# Patient Record
Sex: Male | Born: 1980 | Race: Black or African American | Hispanic: No | Marital: Married | Smoking: Current every day smoker
Health system: Southern US, Community
[De-identification: ages and names within clinical notes are randomized; demographics above are authoritative.]

## PROBLEM LIST (undated history)

## (undated) DIAGNOSIS — G4733 Obstructive sleep apnea (adult) (pediatric): Secondary | ICD-10-CM

## (undated) DIAGNOSIS — D869 Sarcoidosis, unspecified: Secondary | ICD-10-CM

## (undated) DIAGNOSIS — F329 Major depressive disorder, single episode, unspecified: Secondary | ICD-10-CM

## (undated) DIAGNOSIS — F32A Depression, unspecified: Secondary | ICD-10-CM

## (undated) DIAGNOSIS — I10 Essential (primary) hypertension: Secondary | ICD-10-CM

## (undated) DIAGNOSIS — G4730 Sleep apnea, unspecified: Secondary | ICD-10-CM

## (undated) DIAGNOSIS — F419 Anxiety disorder, unspecified: Secondary | ICD-10-CM

## (undated) DIAGNOSIS — Z9989 Dependence on other enabling machines and devices: Secondary | ICD-10-CM

## (undated) DIAGNOSIS — M1990 Unspecified osteoarthritis, unspecified site: Secondary | ICD-10-CM

## (undated) DIAGNOSIS — I319 Disease of pericardium, unspecified: Secondary | ICD-10-CM

## (undated) DIAGNOSIS — K219 Gastro-esophageal reflux disease without esophagitis: Secondary | ICD-10-CM

## (undated) DIAGNOSIS — J189 Pneumonia, unspecified organism: Secondary | ICD-10-CM

## (undated) DIAGNOSIS — I493 Ventricular premature depolarization: Secondary | ICD-10-CM

## (undated) DIAGNOSIS — R7303 Prediabetes: Secondary | ICD-10-CM

## (undated) HISTORY — DX: Ventricular premature depolarization: I49.3

## (undated) HISTORY — DX: Sleep apnea, unspecified: G47.30

## (undated) HISTORY — DX: Disease of pericardium, unspecified: I31.9

## (undated) HISTORY — DX: Prediabetes: R73.03

## (undated) HISTORY — DX: Anxiety disorder, unspecified: F41.9

## (undated) HISTORY — DX: Obstructive sleep apnea (adult) (pediatric): G47.33

## (undated) HISTORY — DX: Dependence on other enabling machines and devices: Z99.89

## (undated) HISTORY — DX: Sarcoidosis, unspecified: D86.9

## (undated) HISTORY — PX: NO PAST SURGERIES: SHX2092

---
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
## 2017-04-18 ENCOUNTER — Ambulatory Visit
Admission: EM | Admit: 2017-04-18 | Discharge: 2017-04-18 | Disposition: A | Discharge status: Home / Self Care | Payer: Worker's Compensation | Attending: Family Medicine | Admitting: Family Medicine

## 2017-04-18 ENCOUNTER — Encounter: Payer: Self-pay | Admitting: Emergency Medicine

## 2017-04-18 ENCOUNTER — Other Ambulatory Visit: Payer: Self-pay

## 2017-04-18 DIAGNOSIS — T33532A Superficial frostbite of left finger(s), initial encounter: Secondary | ICD-10-CM | POA: Diagnosis not present

## 2017-04-18 DIAGNOSIS — T33531A Superficial frostbite of right finger(s), initial encounter: Secondary | ICD-10-CM

## 2017-04-18 DIAGNOSIS — T3390XA Superficial frostbite of unspecified sites, initial encounter: Secondary | ICD-10-CM

## 2017-04-18 DIAGNOSIS — W932XXA Prolonged exposure in deep freeze unit or refrigerator, initial encounter: Secondary | ICD-10-CM

## 2017-04-18 HISTORY — DX: Essential (primary) hypertension: I10

## 2017-04-18 NOTE — ED Triage Notes (Signed)
Patient in today c/o skin discoloration and tingling x 1-2 weeks of his finger tips and bilateral great toes. 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

## 2017-04-18 NOTE — ED Provider Notes (Signed)
[facility]    CSN: [id number] Arrival date & time: 04/18/17  0846     History   Chief Complaint Chief Complaint  Patient presents with  . Skin Discoloration    HPI Luis Mcknight is a 37 y.o. male.   37 yo male with a c/o skin discoloration and tingling to his fingertips for the last 7 days. 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 However he noticed his symptoms 3 days after his last work shift about 10 days ago. Denies any blisters, bleeding, drainage, fevers, chills.    The history is provided by the patient.    Past Medical History:  Diagnosis Date  . Hypertension     There are no active problems to display for this patient.   History reviewed. No pertinent surgical history.     Home Medications    Prior to Admission medications   Medication Sig Start Date End Date Taking? Authorizing Provider  amLODipine (NORVASC) 10 MG tablet Take 10 mg by mouth daily.   Yes [provider]    Family History Family History  Problem Relation Age of Onset  . Hypertension Mother   . Diabetes Mother   . Diabetes Father   . Hypertension Father     Social History Social History   Tobacco Use  . Smoking status: Current Every Day Smoker    Packs/day: 1.00    Years: 15.00    Pack years: 15.00    Types: Cigarettes  . Smokeless tobacco: Never Used  Substance Use Topics  . Alcohol use: Yes    Comment: socially  . Drug use: No     Allergies   Patient has no known allergies.   Review of Systems Review of Systems   Physical Exam Triage Vital Signs ED Triage Vitals [04/18/17 0917]  Enc Vitals Group     BP 136/78     Pulse Rate 70     Resp 16     Temp 98.4 F (36.9 C)     Temp Source Oral     SpO2 100 %     Weight 230 lb (104.3 kg)     Height 6' (1.829 m)     Head Circumference      Peak Flow      Pain Score 4     Pain Loc      Pain Edu?    Excl. in [place]?    No data found.  Updated Vital Signs BP 136/78 (BP Location: Left Arm)   Pulse 70   Temp 98.4 F (36.9 C) (Oral)   Resp 16   Ht 6' (1.829 m)   Wt 230 lb (104.3 kg)   SpO2 100%   BMI 31.19 kg/m   Visual Acuity Right Eye Distance:   Left Eye Distance:   Bilateral Distance:    Right Eye Near:   Left Eye Near:    Bilateral Near:     Physical Exam  Constitutional: He appears well-developed and well-nourished. No distress.  Musculoskeletal:       Right hand: He exhibits normal range of motion, no tenderness, no bony tenderness, normal two-point discrimination, normal capillary refill, no deformity, no laceration and no swelling. Normal sensation noted. Normal strength noted.       Left hand: He exhibits normal range of motion, no tenderness, no bony tenderness, normal two-point discrimination, normal capillary refill, no deformity and no laceration.  Normal sensation noted. Normal strength noted.  Skin: Skin is warm. He is not diaphoretic.  Mild skin discoloration to fingertips; positive normal capillary refill; no skin lesions or blisters  Nursing note and vitals reviewed.    UC Treatments / Results  Labs (all labs ordered are listed, but only abnormal results are displayed) Labs Reviewed - No data to display  EKG  EKG Interpretation None       Radiology No results found.  Procedures Procedures (including critical care time)  Medications Ordered in UC Medications - No data to display   Initial Impression / Assessment and Plan / UC Course  I have reviewed the triage vital signs and the nursing notes.  Pertinent labs & imaging results that were available during my care of the patient were reviewed by me and considered in my medical decision making (see chart for details).      Final Clinical Impressions(s) / UC Diagnoses   Final diagnoses:  Frostbite, initial encounter  (mild; improving)  ED Discharge Orders    None     1. diagnosis  reviewed with patient 2. Recommend supportive treatment with warming measures for hand/fingers; monitor 3. Follow-up prn if symptoms worsen or don't improve  Controlled Substance Prescriptions [place] Controlled Substance Registry consulted? Not Applicable   Norval Gable, MD 04/18/17 2047

## 2017-08-30 ENCOUNTER — Encounter: Payer: Self-pay | Admitting: Emergency Medicine

## 2017-08-30 ENCOUNTER — Other Ambulatory Visit: Payer: Self-pay

## 2017-08-30 ENCOUNTER — Ambulatory Visit
Admission: EM | Admit: 2017-08-30 | Discharge: 2017-08-30 | Disposition: A | Discharge status: Home / Self Care | Payer: Medicaid Other | Attending: Emergency Medicine | Admitting: Emergency Medicine

## 2017-08-30 DIAGNOSIS — G44211 Episodic tension-type headache, intractable: Secondary | ICD-10-CM

## 2017-08-30 DIAGNOSIS — G44201 Tension-type headache, unspecified, intractable: Secondary | ICD-10-CM | POA: Diagnosis not present

## 2017-08-30 DIAGNOSIS — I10 Essential (primary) hypertension: Secondary | ICD-10-CM | POA: Diagnosis not present

## 2017-08-30 DIAGNOSIS — Z760 Encounter for issue of repeat prescription: Secondary | ICD-10-CM

## 2017-08-30 MED ORDER — LISINOPRIL 10 MG PO TABS: 10.0000 mg | ORAL_TABLET | Freq: Every day | ORAL | 0 refills | Status: DC

## 2017-08-30 NOTE — ED Provider Notes (Signed)
[facility]    CSN: [id number] Arrival date & time: 08/30/17  1257     History   Chief Complaint Chief Complaint  Patient presents with  . Hypertension    HPI Luis Mcknight is a 37 y.o. male.   37 year old male presents with concern over elevated blood pressure. He has been on Amlodipine (Norvasc) 10mg  in the past but blood pressure still has remained elevated. Readings have been 150/90-100. He has even increased it to 20mg  daily with minimal results. He just ran out of Norvasc about a week ago. He has tried his wife's Lisinopril with HCTZ since he has run out and found that it does help to decrease his blood pressure. He is debating whether to switch or increase his current medication. He does have occasional occipital headaches but denies any fever, dizziness, vision changes, chest pain, shortness of breath or wheezing. He does smoke about 1 pack of cigarettes daily. He also admits to taking a lot of Ibuprofen and Goody powders as well as drink caffeine almost daily which can affect his blood pressure. He states he had labwork checking for diabetes and kidney function within the year and results were normal per patient. Both Mom and Dad had HTN. Otherwise no other chronic health issues. He recently moved from [place] to [place] area and has not found a PCP yet.   The history is provided by the patient.    Past Medical History:  Diagnosis Date  . Hypertension     There are no active problems to display for this patient.   History reviewed. No pertinent surgical history.     Home Medications    Prior to Admission medications   Medication Sig Start Date End Date Taking? Authorizing Provider  lisinopril (PRINIVIL,ZESTRIL) 10 MG tablet Take 1 tablet (10 mg total) by mouth daily. After 1 week, if blood pressure is still >140/90, may increase to 2 tablets by mouth daily. 08/30/17   [name], NP    Family History Family History  Problem Relation Age of  Onset  . Hypertension Mother   . Diabetes Mother   . Diabetes Father   . Hypertension Father     Social History Social History   Tobacco Use  . Smoking status: Current Every Day Smoker    Packs/day: 1.00    Years: 15.00    Pack years: 15.00    Types: Cigarettes  . Smokeless tobacco: Never Used  Substance Use Topics  . Alcohol use: Yes    Comment: socially  . Drug use: No     Allergies   Patient has no known allergies.   Review of Systems Review of Systems  Constitutional: Negative for activity change, appetite change, chills, diaphoresis, fatigue and fever.  HENT: Negative for congestion, sinus pressure, sore throat and trouble swallowing.   Eyes: Negative for photophobia and visual disturbance.  Respiratory: Negative for cough, chest tightness, shortness of breath and wheezing.   Cardiovascular: Negative for chest pain, palpitations and leg swelling.  Gastrointestinal: Negative for abdominal pain, nausea and vomiting.  Genitourinary: Negative for decreased urine volume, difficulty urinating, dysuria and flank pain.  Musculoskeletal: Negative for arthralgias, myalgias, neck pain and neck stiffness.  Skin: Negative for color change, rash and wound.  Neurological: Positive for headaches. Negative for dizziness, tremors, seizures, syncope, speech difficulty, weakness, light-headedness and numbness.  Hematological: Negative for adenopathy. Does not bruise/bleed easily.     Physical Exam Triage Vital Signs ED Triage Vitals  Enc  Vitals Group     BP 08/30/17 1310 (!) 141/97     Pulse Rate 08/30/17 1310 75     Resp 08/30/17 1310 18     Temp 08/30/17 1310 98.5 F (36.9 C)     Temp Source 08/30/17 1310 Oral     SpO2 08/30/17 1310 99 %     Weight 08/30/17 1309 238 lb (108 kg)     Height 08/30/17 1309 6\' 1"  (1.854 m)     Head Circumference --      Peak Flow --      Pain Score 08/30/17 1308 4     Pain Loc --      Pain Edu? --      Excl. in [place]? --    No data  found.  Updated Vital Signs BP (!) 141/97 (BP Location: Left Arm)   Pulse 75   Temp 98.5 F (36.9 C) (Oral)   Resp 18   Ht 6\' 1"  (1.854 m)   Wt 238 lb (108 kg)   SpO2 99%   BMI 31.40 kg/m   Visual Acuity Right Eye Distance:   Left Eye Distance:   Bilateral Distance:    Right Eye Near:   Left Eye Near:    Bilateral Near:     Physical Exam  Constitutional: He is oriented to person, place, and time. He appears well-developed and well-nourished. He is cooperative. He does not appear ill. No distress.  Patient sitting in exam chair in no acute distress.   HENT:  Head: Normocephalic and atraumatic.  Right Ear: Hearing and external ear normal.  Left Ear: Hearing and external ear normal.  Nose: Nose normal.  Mouth/Throat: Uvula is midline, oropharynx is clear and moist and mucous membranes are normal.  Eyes: Conjunctivae and EOM are normal.  Neck: Normal range of motion. Neck supple.  Cardiovascular: Normal rate, regular rhythm and normal heart sounds.  No murmur heard. Pulmonary/Chest: Effort normal and breath sounds normal. No respiratory distress. He has no decreased breath sounds. He has no wheezes. He has no rhonchi. He has no rales.  Musculoskeletal: Normal range of motion.  Neurological: He is alert and oriented to person, place, and time. He has normal strength. No cranial nerve deficit.  Skin: Skin is warm and dry. Capillary refill takes less than 2 seconds. No rash noted.  Psychiatric: He has a normal mood and affect. His behavior is normal. Judgment and thought content normal.  Vitals reviewed.    UC Treatments / Results  Labs (all labs ordered are listed, but only abnormal results are displayed) Labs Reviewed - No data to display  EKG None  Radiology No results found.  Procedures Procedures (including critical care time)  Medications Ordered in UC Medications - No data to display  Initial Impression / Assessment and Plan / UC Course  I have reviewed  the triage vital signs and the nursing notes.  Pertinent labs & imaging results that were available during my care of the patient were reviewed by me and considered in my medical decision making (see chart for details).     Reviewed possible treatment options for HTN with patient. Discussed that since Norvasc has not controlled his blood pressure, recommend switching to another agent. Will trial Lisinopril 10mg  once daily- after 5 to 7 days, if blood pressure is still >140/>90, increase to 20mg  daily. Side effects reviewed. Recommend Tylenol 650mg  every 6 hours as needed for headaches/pain. Avoid Ibuprofen and caffeine. Strongly encouraged to d/c smoking. Follow-up with  a PCP in 3 weeks for blood pressure recheck or here if unable to become established with a PCP in that time period or sooner if any new symptoms or side effects develop.  Final Clinical Impressions(s) / UC Diagnoses   Final diagnoses:  Essential hypertension  Intractable episodic tension-type headache     Discharge Instructions     Recommend switch to Lisinopril 10mg  one tablet daily. If blood pressure is still >140/>90 after 5 to 7 days, recommend increase to 2 tablets daily. May take Tylenol 650mg  every 6 hours as needed for headache/pain. Avoid Ibuprofen and caffeine. Follow-up here in 3 weeks for blood pressure recheck or with a PCP.     ED Prescriptions    Medication Sig Dispense Auth. Provider   lisinopril (PRINIVIL,ZESTRIL) 10 MG tablet Take 1 tablet (10 mg total) by mouth daily. After 1 week, if blood pressure is still >140/90, may increase to 2 tablets by mouth daily. 60 tablet [name], NP     Controlled Substance Prescriptions [place] Controlled Substance Registry consulted? Not Applicable   [name], NP 08/30/17 2146

## 2017-08-30 NOTE — Discharge Instructions (Signed)
Recommend switch to Lisinopril 10mg  one tablet daily. If blood pressure is still >140/>90 after 5 to 7 days, recommend increase to 2 tablets daily. May take Tylenol 650mg  every 6 hours as needed for headache/pain. Avoid Ibuprofen and caffeine. Follow-up here in 3 weeks for blood pressure recheck or with a PCP.

## 2017-08-30 NOTE — ED Triage Notes (Signed)
Patient here for high blood pressure. Has been on Amlodipine 10mg  daily but ran out of his medication 1 week ago. Patient has relocated from [place] and has not established with a new PCP yet.

## 2017-09-04 ENCOUNTER — Other Ambulatory Visit: Payer: Self-pay

## 2017-09-04 ENCOUNTER — Ambulatory Visit
Admission: EM | Admit: 2017-09-04 | Discharge: 2017-09-04 | Disposition: A | Discharge status: Home / Self Care | Payer: Medicaid Other | Attending: Family Medicine | Admitting: Family Medicine

## 2017-09-04 DIAGNOSIS — Z8249 Family history of ischemic heart disease and other diseases of the circulatory system: Secondary | ICD-10-CM | POA: Insufficient documentation

## 2017-09-04 DIAGNOSIS — Z833 Family history of diabetes mellitus: Secondary | ICD-10-CM | POA: Insufficient documentation

## 2017-09-04 DIAGNOSIS — F17210 Nicotine dependence, cigarettes, uncomplicated: Secondary | ICD-10-CM | POA: Diagnosis not present

## 2017-09-04 DIAGNOSIS — Z79899 Other long term (current) drug therapy: Secondary | ICD-10-CM | POA: Diagnosis not present

## 2017-09-04 DIAGNOSIS — I10 Essential (primary) hypertension: Secondary | ICD-10-CM | POA: Diagnosis present

## 2017-09-04 LAB — CBC WITH DIFFERENTIAL/PLATELET
BASOS PCT: 1 %
Basophils Absolute: 0 10*3/uL (ref 0–0.1)
EOS ABS: 0.1 10*3/uL (ref 0–0.7)
EOS PCT: 2 %
HCT: 43.5 % (ref 40.0–52.0)
Hemoglobin: 14.6 g/dL (ref 13.0–18.0)
LYMPHS ABS: 2.1 10*3/uL (ref 1.0–3.6)
Lymphocytes Relative: 31 %
MCH: 26.9 pg (ref 26.0–34.0)
MCHC: 33.5 g/dL (ref 32.0–36.0)
MCV: 80.3 fL (ref 80.0–100.0)
Monocytes Absolute: 0.4 10*3/uL (ref 0.2–1.0)
Monocytes Relative: 5 %
NEUTROS PCT: 61 %
Neutro Abs: 4.2 10*3/uL (ref 1.4–6.5)
Platelets: 225 10*3/uL (ref 150–440)
RBC: 5.42 MIL/uL (ref 4.40–5.90)
RDW: 14.2 % (ref 11.5–14.5)
WBC: 6.8 10*3/uL (ref 3.8–10.6)

## 2017-09-04 LAB — BASIC METABOLIC PANEL
Anion gap: 10 (ref 5–15)
BUN: 13 mg/dL (ref 6–20)
CALCIUM: 9 mg/dL (ref 8.9–10.3)
CHLORIDE: 103 mmol/L (ref 98–111)
CO2: 27 mmol/L (ref 22–32)
CREATININE: 1.16 mg/dL (ref 0.61–1.24)
Glucose, Bld: 109 mg/dL — ABNORMAL HIGH (ref 70–99)
Potassium: 4 mmol/L (ref 3.5–5.1)
Sodium: 140 mmol/L (ref 135–145)

## 2017-09-04 NOTE — ED Triage Notes (Signed)
Patient complains of hypertension. States that he was seen last week by Lelon Frohlich and was told to stop amlodipine and start lisinopril. Patient states that he feels worse since starting lisinopril. Patient states that he has noticed lightheadedness and blood pressure has been running 140/100 or higher.

## 2017-09-04 NOTE — Discharge Instructions (Signed)
Establish care with a primary care doctor

## 2017-09-04 NOTE — ED Provider Notes (Signed)
[facility]    CSN: [id number] Arrival date & time: 09/04/17  1551     History   Chief Complaint Chief Complaint  Patient presents with  . Hypertension    HPI Luis Mcknight is a 37 y.o. male.   37 yo male with a c/o hypertension started last week on lisinopril 10mg  now with c/o "not feeling well". Denies any fevers, chills but does state he's had a mild sore throat. Denies shortness of breath or swelling.   The history is provided by the patient.    Past Medical History:  Diagnosis Date  . Hypertension     There are no active problems to display for this patient.   Past Surgical History:  Procedure Laterality Date  . NO PAST SURGERIES         Home Medications    Prior to Admission medications   Medication Sig Start Date End Date Taking? Authorizing Provider  lisinopril (PRINIVIL,ZESTRIL) 10 MG tablet Take 1 tablet (10 mg total) by mouth daily. After 1 week, if blood pressure is still >140/90, may increase to 2 tablets by mouth daily. 08/30/17  Yes Amyot, Nicholes Stairs, NP    Family History Family History  Problem Relation Age of Onset  . Hypertension Mother   . Diabetes Mother   . Diabetes Father   . Hypertension Father     Social History Social History   Tobacco Use  . Smoking status: Current Every Day Smoker    Packs/day: 1.00    Years: 15.00    Pack years: 15.00    Types: Cigarettes  . Smokeless tobacco: Never Used  Substance Use Topics  . Alcohol use: Yes    Comment: socially  . Drug use: No     Allergies   Patient has no known allergies.   Review of Systems Review of Systems   Physical Exam Triage Vital Signs ED Triage Vitals  Enc Vitals Group     BP 09/04/17 1614 (!) 131/92     Pulse Rate 09/04/17 1614 74     Resp 09/04/17 1614 18     Temp 09/04/17 1614 98.3 F (36.8 C)     Temp Source 09/04/17 1614 Oral     SpO2 09/04/17 1614 100 %     Weight 09/04/17 1612 238 lb (108 kg)     Height 09/04/17 1612 6\' 1"  (1.854  m)     Head Circumference --      Peak Flow --      Pain Score 09/04/17 1612 3     Pain Loc --      Pain Edu? --      Excl. in [place]? --    No data found.  Updated Vital Signs BP (!) 131/92 (BP Location: Left Arm)   Pulse 74   Temp 98.3 F (36.8 C) (Oral)   Resp 18   Ht 6\' 1"  (1.854 m)   Wt 238 lb (108 kg)   SpO2 100%   BMI 31.40 kg/m   Visual Acuity Right Eye Distance:   Left Eye Distance:   Bilateral Distance:    Right Eye Near:   Left Eye Near:    Bilateral Near:     Physical Exam  Constitutional: He appears well-developed and well-nourished. No distress.  Cardiovascular: Normal rate.  Pulmonary/Chest: Effort normal. No stridor. No respiratory distress. He has no wheezes.  Musculoskeletal: He exhibits no edema.  Skin: He is not diaphoretic.  Nursing note and vitals reviewed.  UC Treatments / Results  Labs (all labs ordered are listed, but only abnormal results are displayed) Labs Reviewed  BASIC METABOLIC PANEL - Abnormal; Notable for the following components:      Result Value   Glucose, Bld 109 (*)    All other components within normal limits  CBC WITH DIFFERENTIAL/PLATELET    EKG None  Radiology No results found.  Procedures Procedures (including critical care time)  Medications Ordered in UC Medications - No data to display  Initial Impression / Assessment and Plan / UC Course  I have reviewed the triage vital signs and the nursing notes.  Pertinent labs & imaging results that were available during my care of the patient were reviewed by me and considered in my medical decision making (see chart for details).      Final Clinical Impressions(s) / UC Diagnoses   Final diagnoses:  Essential hypertension     Discharge Instructions     Establish care with a primary care doctor    ED Prescriptions    None     1. Lab results (normal BMP/CBC)  and diagnosis reviewed with patient 2.since patient just started lisinopril low dose  only 5 days ago,  recommend patient continue with this for now and follow up with PCP in the next 2-3 weeks 3. Recommend supportive treatment with increase water intake, dietary modifications 4. Follow-up prn if symptoms worsen or don't improve  Controlled Substance Prescriptions [place] Controlled Substance Registry consulted? Not Applicable   Norval Gable, MD 09/04/17 Bosie Helper

## 2017-09-30 ENCOUNTER — Ambulatory Visit
Admission: EM | Admit: 2017-09-30 | Discharge: 2017-09-30 | Disposition: A | Discharge status: Home / Self Care | Payer: Medicaid Other | Attending: Family Medicine | Admitting: Family Medicine

## 2017-09-30 DIAGNOSIS — Z760 Encounter for issue of repeat prescription: Secondary | ICD-10-CM

## 2017-09-30 DIAGNOSIS — I10 Essential (primary) hypertension: Secondary | ICD-10-CM

## 2017-09-30 MED ORDER — AMLODIPINE BESYLATE 10 MG PO TABS: 10.0000 mg | ORAL_TABLET | Freq: Every day | ORAL | 0 refills | Status: DC

## 2017-09-30 NOTE — ED Provider Notes (Signed)
[facility]    CSN: [id number] Arrival date & time: 09/30/17  0801     History   Chief Complaint Chief Complaint  Patient presents with  . Hypertension    HPI Luis Mcknight is a 37 y.o. male.   37 yo male with a h/o hypertension, who's been seen here several times this year for high blood pressure and most recently at [facility] ED, c/o needing refill on his amlodipine. Patient states he still has not established care with a primary care provider. States at [facility] ED they changed his med from lisinopril to amlodipine which he says is controlling his blood pressure better. Does not have any other complaints. Denies chest pain or shortness of breath.   The history is provided by the patient.  Hypertension     Past Medical History:  Diagnosis Date  . Hypertension     There are no active problems to display for this patient.   Past Surgical History:  Procedure Laterality Date  . NO PAST SURGERIES         Home Medications    Prior to Admission medications   Medication Sig Start Date End Date Taking? Authorizing Provider  amLODipine (NORVASC) 10 MG tablet Take 1 tablet (10 mg total) by mouth daily. 09/30/17   Norval Gable, MD  lisinopril (PRINIVIL,ZESTRIL) 10 MG tablet Take 1 tablet (10 mg total) by mouth daily. After 1 week, if blood pressure is still >140/90, may increase to 2 tablets by mouth daily. 08/30/17   [name], NP    Family History Family History  Problem Relation Age of Onset  . Hypertension Mother   . Diabetes Mother   . Diabetes Father   . Hypertension Father     Social History Social History   Tobacco Use  . Smoking status: Current Every Day Smoker    Packs/day: 1.00    Years: 15.00    Pack years: 15.00    Types: Cigarettes  . Smokeless tobacco: Never Used  Substance Use Topics  . Alcohol use: Yes    Comment: socially  . Drug use: No     Allergies   Patient has no known allergies.   Review of Systems Review of  Systems   Physical Exam Triage Vital Signs ED Triage Vitals  Enc Vitals Group     BP 09/30/17 0817 126/87     Pulse Rate 09/30/17 0817 83     Resp 09/30/17 0817 18     Temp 09/30/17 0817 98.5 F (36.9 C)     Temp src --      SpO2 09/30/17 0817 100 %     Weight 09/30/17 0820 236 lb (107 kg)     Height --      Head Circumference --      Peak Flow --      Pain Score 09/30/17 0820 0     Pain Loc --      Pain Edu? --      Excl. in [place]? --    No data found.  Updated Vital Signs BP 126/87 (BP Location: Left Arm)   Pulse 83   Temp 98.5 F (36.9 C)   Resp 18   Wt 236 lb (107 kg)   SpO2 100%   BMI 31.14 kg/m   Visual Acuity Right Eye Distance:   Left Eye Distance:   Bilateral Distance:    Right Eye Near:   Left Eye Near:    Bilateral Near:  Physical Exam  Constitutional: He appears well-developed and well-nourished. No distress.  Cardiovascular: Normal rate, regular rhythm and normal heart sounds.  Pulmonary/Chest: Effort normal and breath sounds normal. No stridor. No respiratory distress. He has no wheezes. He has no rales.  Skin: He is not diaphoretic.  Nursing note and vitals reviewed.    UC Treatments / Results  Labs (all labs ordered are listed, but only abnormal results are displayed) Labs Reviewed - No data to display  EKG None  Radiology No results found.  Procedures Procedures (including critical care time)  Medications Ordered in UC Medications - No data to display  Initial Impression / Assessment and Plan / UC Course  I have reviewed the triage vital signs and the nursing notes.  Pertinent labs & imaging results that were available during my care of the patient were reviewed by me and considered in my medical decision making (see chart for details).      Final Clinical Impressions(s) / UC Diagnoses   Final diagnoses:  Essential hypertension    ED Prescriptions    Medication Sig Dispense Auth. Provider   amLODipine (NORVASC) 10  MG tablet Take 1 tablet (10 mg total) by mouth daily. 30 tablet Norval Gable, MD      1. diagnosis reviewed with patient 2. rx as per orders above; reviewed possible side effects, interactions, risks and benefits; refilled med for one month 3.discussed with patient importance of establishing care with a PCP for continuing management and chronic refills  Controlled Substance Prescriptions [place] Controlled Substance Registry consulted? Not Applicable   Norval Gable, MD 09/30/17 1131

## 2017-09-30 NOTE — ED Triage Notes (Signed)
Pt is here for high BP and is out of his amlodipine, has had trouble finding a primary care provider and has been out of this medication for 2 days.

## 2017-11-28 DIAGNOSIS — I10 Essential (primary) hypertension: Secondary | ICD-10-CM | POA: Insufficient documentation

## 2017-12-21 ENCOUNTER — Other Ambulatory Visit: Payer: Self-pay

## 2017-12-21 ENCOUNTER — Encounter: Payer: Self-pay | Admitting: *Deleted

## 2017-12-21 DIAGNOSIS — R600 Localized edema: Secondary | ICD-10-CM | POA: Diagnosis not present

## 2017-12-21 DIAGNOSIS — Z79899 Other long term (current) drug therapy: Secondary | ICD-10-CM | POA: Diagnosis not present

## 2017-12-21 DIAGNOSIS — Y658 Other specified misadventures during surgical and medical care: Secondary | ICD-10-CM | POA: Insufficient documentation

## 2017-12-21 DIAGNOSIS — F17210 Nicotine dependence, cigarettes, uncomplicated: Secondary | ICD-10-CM | POA: Insufficient documentation

## 2017-12-21 DIAGNOSIS — T887XXA Unspecified adverse effect of drug or medicament, initial encounter: Secondary | ICD-10-CM | POA: Insufficient documentation

## 2017-12-21 DIAGNOSIS — I10 Essential (primary) hypertension: Secondary | ICD-10-CM | POA: Insufficient documentation

## 2017-12-21 LAB — CBC
HEMATOCRIT: 43.8 % (ref 39.0–52.0)
Hemoglobin: 14.7 g/dL (ref 13.0–17.0)
MCH: 26.8 pg (ref 26.0–34.0)
MCHC: 33.6 g/dL (ref 30.0–36.0)
MCV: 79.8 fL — AB (ref 80.0–100.0)
Platelets: 286 10*3/uL (ref 150–400)
RBC: 5.49 MIL/uL (ref 4.22–5.81)
RDW: 13.3 % (ref 11.5–15.5)
WBC: 6.1 10*3/uL (ref 4.0–10.5)
nRBC: 0 % (ref 0.0–0.2)

## 2017-12-21 LAB — BASIC METABOLIC PANEL
ANION GAP: 6 (ref 5–15)
BUN: 13 mg/dL (ref 6–20)
CHLORIDE: 104 mmol/L (ref 98–111)
CO2: 29 mmol/L (ref 22–32)
CREATININE: 0.94 mg/dL (ref 0.61–1.24)
Calcium: 9 mg/dL (ref 8.9–10.3)
GFR calc non Af Amer: >60 mL/min (ref >60)
Glucose, Bld: 107 mg/dL — ABNORMAL HIGH (ref 70–99)
POTASSIUM: 3.7 mmol/L (ref 3.5–5.1)
SODIUM: 139 mmol/L (ref 135–145)

## 2017-12-21 LAB — TROPONIN I: Troponin I: <0.03 ng/mL (ref <0.03)

## 2017-12-21 NOTE — ED Triage Notes (Signed)
Pt has swelling to both lower legs for 3 days.  No sob.  No chest pain.  cig smoker.  Pt alert.  Hx htn

## 2017-12-22 ENCOUNTER — Emergency Department: Payer: Medicaid Other

## 2017-12-22 ENCOUNTER — Emergency Department
Admission: EM | Admit: 2017-12-22 | Discharge: 2017-12-22 | Disposition: A | Discharge status: Home / Self Care | Payer: Medicaid Other | Attending: Emergency Medicine | Admitting: Emergency Medicine

## 2017-12-22 DIAGNOSIS — T887XXA Unspecified adverse effect of drug or medicament, initial encounter: Secondary | ICD-10-CM

## 2017-12-22 DIAGNOSIS — R609 Edema, unspecified: Secondary | ICD-10-CM

## 2017-12-22 LAB — BRAIN NATRIURETIC PEPTIDE: B Natriuretic Peptide: 4 pg/mL (ref 0.0–100.0)

## 2017-12-22 MED ORDER — ACETAMINOPHEN 500 MG PO TABS
1000.0000 mg | ORAL_TABLET | Freq: Once | ORAL | Status: AC
  Administered 2017-12-22: 1000 mg via ORAL
  Filled 2017-12-22: qty 2

## 2017-12-22 MED ORDER — HYDROCHLOROTHIAZIDE 12.5 MG PO CAPS: 12.5000 mg | ORAL_CAPSULE | Freq: Every day | ORAL | 0 refills | Status: DC

## 2017-12-22 NOTE — Discharge Instructions (Signed)
Fortunately today your blood work, your EKG, and your chest x-ray are very reassuring.  It seems as if your swelling is likely related to amlodipine so I recommend you stop taking this medication and instead begin taking hydrochlorothiazide.  Please follow-up with your primary care physician within 1 week for a blood pressure recheck a return to the emergency department sooner for any concerns.  It was a pleasure to take care of you today, and thank you for coming to our emergency department.  If you have any questions or concerns before leaving please ask the nurse to grab me and I'm more than happy to go through your aftercare instructions again.  If you were prescribed any opioid pain medication today such as Norco, Vicodin, Percocet, morphine, hydrocodone, or oxycodone please make sure you do not drive when you are taking this medication as it can alter your ability to drive safely.  If you have any concerns once you are home that you are not improving or are in fact getting worse before you can make it to your follow-up appointment, please do not hesitate to call 911 and come back for further evaluation.  [name], MD  Results for orders placed or performed during the hospital encounter of 57/26/20  Basic metabolic panel  Result Value Ref Range   Sodium 139 135 - 145 mmol/L   Potassium 3.7 3.5 - 5.1 mmol/L   Chloride 104 98 - 111 mmol/L   CO2 29 22 - 32 mmol/L   Glucose, Bld 107 (H) 70 - 99 mg/dL   BUN 13 6 - 20 mg/dL   Creatinine, Ser 0.94 0.61 - 1.24 mg/dL   Calcium 9.0 8.9 - 10.3 mg/dL   GFR calc non Af Amer >60 >60 mL/min   GFR calc Af Amer >60 >60 mL/min   Anion gap 6 5 - 15  CBC  Result Value Ref Range   WBC 6.1 4.0 - 10.5 K/uL   RBC 5.49 4.22 - 5.81 MIL/uL   Hemoglobin 14.7 13.0 - 17.0 g/dL   HCT 43.8 39.0 - 52.0 %   MCV 79.8 (L) 80.0 - 100.0 fL   MCH 26.8 26.0 - 34.0 pg   MCHC 33.6 30.0 - 36.0 g/dL   RDW 13.3 11.5 - 15.5 %   Platelets 286 150 - 400 K/uL   nRBC  0.0 0.0 - 0.2 %  Troponin I  Result Value Ref Range   Troponin I <0.03 <0.03 ng/mL  Brain natriuretic peptide  Result Value Ref Range   B Natriuretic Peptide 4.0 0.0 - 100.0 pg/mL   Dg Chest 2 View  Result Date: 12/22/2017 CLINICAL DATA:  Bilateral lower extremity swelling. Concern for CHF. EXAM: CHEST - 2 VIEW COMPARISON:  None. FINDINGS: The cardiomediastinal contours are normal. The lungs are clear. Pulmonary vasculature is normal. No consolidation, pleural effusion, or pneumothorax. No acute osseous abnormalities are seen. Mild scoliotic curvature of the upper thoracic spine. IMPRESSION: No acute abnormalities, particularly, no evidence of CHF. Mild thoracic scoliosis. Electronically Signed   By: Keith Rake M.D.   On: 12/22/2017 03:13

## 2017-12-22 NOTE — ED Provider Notes (Signed)
[facility] Emergency Department Provider Note  ____________________________________________   First MD Initiated Contact with Patient 12/22/17 [phone]     (approximate)  I have reviewed the triage vital signs and the nursing notes.   HISTORY  Chief Complaint Leg Swelling   HPI Luis Mcknight is a 37 y.o. male comes to the emergency department with 3 days of worsening bilateral lower extremity swelling.  He has had swelling for several months ever since his antihypertensive was switched from lisinopril to amlodipine.  The swelling has been gradual onset slowly progressive is now moderate severity.  He has no recent surgery travel or immobilization.  No history of DVT or pulmonary embolism.  He has not gained weight.  He sleeps on one pillow and does not wake at night short of breath.  He denies chest pain shortness of breath or cough.  He has no change in his skin or hair.  No change in his temperature perception.   Nothing in particular seems to make his symptoms better or worse.   Past Medical History:  Diagnosis Date  . Hypertension     There are no active problems to display for this patient.   Past Surgical History:  Procedure Laterality Date  . NO PAST SURGERIES      Prior to Admission medications   Medication Sig Start Date End Date Taking? Authorizing Provider  hydrochlorothiazide (MICROZIDE) 12.5 MG capsule Take 1 capsule (12.5 mg total) by mouth daily. 12/22/17 12/22/18  [name], MD  lisinopril (PRINIVIL,ZESTRIL) 10 MG tablet Take 1 tablet (10 mg total) by mouth daily. After 1 week, if blood pressure is still >140/90, may increase to 2 tablets by mouth daily. 08/30/17   [name], NP    Allergies Patient has no known allergies.  Family History  Problem Relation Age of Onset  . Hypertension Mother   . Diabetes Mother   . Diabetes Father   . Hypertension Father     Social History Social History   Tobacco Use  . Smoking  status: Current Every Day Smoker    Packs/day: 1.00    Years: 15.00    Pack years: 15.00    Types: Cigarettes  . Smokeless tobacco: Never Used  Substance Use Topics  . Alcohol use: Yes    Comment: socially  . Drug use: No    Review of Systems Constitutional: No fever/chills Eyes: No visual changes. ENT: No sore throat. Cardiovascular: Denies chest pain. Respiratory: Denies shortness of breath. Gastrointestinal: No abdominal pain.  No nausea, no vomiting.  No diarrhea.  No constipation. Genitourinary: Negative for dysuria. Musculoskeletal: Positive for leg swelling Skin: Negative for rash. Neurological: Negative for headaches, focal weakness or numbness.   ____________________________________________   PHYSICAL EXAM:  VITAL SIGNS: ED Triage Vitals [12/21/17 2159]  Enc Vitals Group     BP (!) 154/106     Pulse Rate 93     Resp 20     Temp 98.6 F (37 C)     Temp Source Oral     SpO2 95 %     Weight 240 lb (108.9 kg)     Height 6\' 1"  (1.854 m)     Head Circumference      Peak Flow      Pain Score 8     Pain Loc      Pain Edu?      Excl. in [place]?     Constitutional: Alert and oriented x4 well-appearing nontoxic no diaphoresis speaks  in full clear sentences Eyes: PERRL EOMI. Head: Atraumatic. Nose: No congestion/rhinnorhea. Mouth/Throat: No trismus Neck: No stridor.  Able to lie completely flat no JVD Cardiovascular: Normal rate, regular rhythm. Grossly normal heart sounds.  Good peripheral circulation. Respiratory: Normal respiratory effort.  No retractions. Lungs CTAB and moving good air Gastrointestinal: Soft nontender Musculoskeletal: Bilateral lower extremities slightly edematous although not pitting edema in legs are equal in size Neurologic:  Normal speech and language. No gross focal neurologic deficits are appreciated. Skin:  Skin is warm, dry and intact. No rash noted. Psychiatric: Mood and affect are normal. Speech and behavior are  normal.    ____________________________________________   DIFFERENTIAL includes but not limited to  DVT, heart failure, myxedema, amlodipine side effect ____________________________________________   LABS (all labs ordered are listed, but only abnormal results are displayed)  Labs Reviewed  BASIC METABOLIC PANEL - Abnormal; Notable for the following components:      Result Value   Glucose, Bld 107 (*)    All other components within normal limits  CBC - Abnormal; Notable for the following components:   MCV 79.8 (*)    All other components within normal limits  TROPONIN I  BRAIN NATRIURETIC PEPTIDE    Work reviewed by me shows no signs of heart failure __________________________________________  EKG  ED ECG REPORT I, [name], the attending physician, personally viewed and interpreted this ECG.  Date: 12/24/2017 EKG Time:  Rate: 78 Rhythm: normal sinus rhythm QRS Axis: Rightward axis Intervals: normal ST/T Wave abnormalities: normal Narrative Interpretation: no evidence of acute ischemia  ____________________________________________  RADIOLOGY  Chest x-ray reviewed by me with no acute disease ____________________________________________   PROCEDURES  Procedure(s) performed: no  Procedures  Critical Care performed: no  ____________________________________________   INITIAL IMPRESSION / ASSESSMENT AND PLAN / ED COURSE  Pertinent labs & imaging results that were available during my care of the patient were reviewed by me and considered in my medical decision making (see chart for details).   As part of my medical decision making, I reviewed the following data within the [place] History obtained from family if available, nursing notes, old chart and ekg, as well as notes from prior ED visits.  Patient has gradual onset bilateral lower extremity edema.  No risk factors for DVT.  His edema is nonpitting.  EKG with no signs of  high-voltage and chest x-ray with no signs of fluid and normal BNP.  His symptoms are most likely related to amlodipine side effect.  I am taking him off amlodipine and I will defer ACE inhibitor as he seemed to fail it before.  I will give hydrochlorothiazide instead and refer him back to primary care.  Strict return precautions have been given.      ____________________________________________   FINAL CLINICAL IMPRESSION(S) / ED DIAGNOSES  Final diagnoses:  Peripheral edema  Non-dose-related adverse reaction to medication, initial encounter      NEW MEDICATIONS STARTED DURING THIS VISIT:  Discharge Medication List as of 12/22/2017  3:55 AM    START taking these medications   Details  hydrochlorothiazide (MICROZIDE) 12.5 MG capsule Take 1 capsule (12.5 mg total) by mouth daily., Starting Fri 12/22/2017, Until Sat 12/22/2018, Print         Note:  This document was prepared using Dragon voice recognition software and may include unintentional dictation errors.     [name], MD 12/24/17 [phone]

## 2018-03-27 ENCOUNTER — Emergency Department: Payer: Medicaid Other

## 2018-03-27 ENCOUNTER — Encounter: Payer: Self-pay | Admitting: *Deleted

## 2018-03-27 ENCOUNTER — Other Ambulatory Visit: Payer: Self-pay

## 2018-03-27 ENCOUNTER — Emergency Department
Admission: EM | Admit: 2018-03-27 | Discharge: 2018-03-28 | Disposition: A | Discharge status: Home / Self Care | Payer: Medicaid Other | Attending: Emergency Medicine | Admitting: Emergency Medicine

## 2018-03-27 DIAGNOSIS — I301 Infective pericarditis: Secondary | ICD-10-CM

## 2018-03-27 DIAGNOSIS — R0602 Shortness of breath: Secondary | ICD-10-CM | POA: Insufficient documentation

## 2018-03-27 DIAGNOSIS — I10 Essential (primary) hypertension: Secondary | ICD-10-CM | POA: Diagnosis not present

## 2018-03-27 DIAGNOSIS — B3323 Viral pericarditis: Secondary | ICD-10-CM | POA: Insufficient documentation

## 2018-03-27 DIAGNOSIS — F17210 Nicotine dependence, cigarettes, uncomplicated: Secondary | ICD-10-CM | POA: Insufficient documentation

## 2018-03-27 DIAGNOSIS — J189 Pneumonia, unspecified organism: Secondary | ICD-10-CM | POA: Diagnosis not present

## 2018-03-27 DIAGNOSIS — R079 Chest pain, unspecified: Secondary | ICD-10-CM | POA: Diagnosis present

## 2018-03-27 LAB — BASIC METABOLIC PANEL
Anion gap: 6 (ref 5–15)
BUN: 14 mg/dL (ref 6–20)
CALCIUM: 8.6 mg/dL — AB (ref 8.9–10.3)
CHLORIDE: 105 mmol/L (ref 98–111)
CO2: 28 mmol/L (ref 22–32)
Creatinine, Ser: 0.93 mg/dL (ref 0.61–1.24)
GFR calc Af Amer: >60 mL/min (ref >60)
GFR calc non Af Amer: >60 mL/min (ref >60)
GLUCOSE: 93 mg/dL (ref 70–99)
Potassium: 3.5 mmol/L (ref 3.5–5.1)
Sodium: 139 mmol/L (ref 135–145)

## 2018-03-27 LAB — CBC
HEMATOCRIT: 41.8 % (ref 39.0–52.0)
Hemoglobin: 13.4 g/dL (ref 13.0–17.0)
MCH: 25.9 pg — AB (ref 26.0–34.0)
MCHC: 32.1 g/dL (ref 30.0–36.0)
MCV: 80.7 fL (ref 80.0–100.0)
NRBC: 0 % (ref 0.0–0.2)
Platelets: 303 10*3/uL (ref 150–400)
RBC: 5.18 MIL/uL (ref 4.22–5.81)
RDW: 12.8 % (ref 11.5–15.5)
WBC: 7.8 10*3/uL (ref 4.0–10.5)

## 2018-03-27 LAB — INFLUENZA PANEL BY PCR (TYPE A & B)
Influenza A By PCR: NEGATIVE
Influenza B By PCR: NEGATIVE

## 2018-03-27 LAB — TROPONIN I: Troponin I: <0.03 ng/mL (ref <0.03)

## 2018-03-27 MED ORDER — NITROGLYCERIN 0.4 MG SL SUBL
0.4000 mg | SUBLINGUAL_TABLET | SUBLINGUAL | Status: DC | PRN
  Administered 2018-03-27: 0.4 mg via SUBLINGUAL

## 2018-03-27 MED ORDER — SODIUM CHLORIDE 0.9% FLUSH
3.0000 mL | Freq: Once | INTRAVENOUS | Status: AC
  Administered 2018-03-27: 3 mL via INTRAVENOUS

## 2018-03-27 MED ORDER — IOHEXOL 350 MG/ML SOLN
100.0000 mL | Freq: Once | INTRAVENOUS | Status: AC | PRN
  Administered 2018-03-27: 100 mL via INTRAVENOUS

## 2018-03-27 MED ORDER — SODIUM CHLORIDE 0.9 % IV SOLN
Freq: Once | INTRAVENOUS | Status: AC
  Administered 2018-03-27: 23:00:00 via INTRAVENOUS

## 2018-03-27 MED ORDER — ASPIRIN 81 MG PO CHEW
CHEWABLE_TABLET | ORAL | Status: AC
  Administered 2018-03-27: 324 mg via ORAL
  Filled 2018-03-27: qty 4

## 2018-03-27 MED ORDER — IBUPROFEN 600 MG PO TABS: 600.0000 mg | ORAL_TABLET | Freq: Four times a day (QID) | ORAL | 0 refills | Status: DC | PRN

## 2018-03-27 MED ORDER — ACETAMINOPHEN 500 MG PO TABS
1000.0000 mg | ORAL_TABLET | Freq: Once | ORAL | Status: AC
  Administered 2018-03-27: 1000 mg via ORAL
  Filled 2018-03-27: qty 2

## 2018-03-27 MED ORDER — COLCHICINE 0.6 MG PO TABS: 0.6000 mg | ORAL_TABLET | Freq: Two times a day (BID) | ORAL | 0 refills | Status: DC

## 2018-03-27 MED ORDER — SODIUM CHLORIDE 0.9 % IV SOLN
1.0000 g | Freq: Once | INTRAVENOUS | Status: AC
  Administered 2018-03-27: 1 g via INTRAVENOUS
  Filled 2018-03-27: qty 10

## 2018-03-27 MED ORDER — SODIUM CHLORIDE 0.9 % IV BOLUS
1000.0000 mL | Freq: Once | INTRAVENOUS | Status: AC
  Administered 2018-03-27: 1000 mL via INTRAVENOUS

## 2018-03-27 MED ORDER — COLCHICINE 0.6 MG PO TABS
1.2000 mg | ORAL_TABLET | Freq: Once | ORAL | Status: AC
  Administered 2018-03-27: 1.2 mg via ORAL
  Filled 2018-03-27: qty 2

## 2018-03-27 MED ORDER — ASPIRIN 81 MG PO CHEW
324.0000 mg | CHEWABLE_TABLET | Freq: Once | ORAL | Status: AC
  Administered 2018-03-27: 324 mg via ORAL

## 2018-03-27 MED ORDER — AZITHROMYCIN 500 MG PO TABS
500.0000 mg | ORAL_TABLET | Freq: Once | ORAL | Status: AC
  Administered 2018-03-27: 500 mg via ORAL
  Filled 2018-03-27: qty 1

## 2018-03-27 MED ORDER — NITROGLYCERIN 0.4 MG SL SUBL
SUBLINGUAL_TABLET | SUBLINGUAL | Status: AC
  Filled 2018-03-27: qty 1

## 2018-03-27 MED ORDER — AZITHROMYCIN 250 MG PO TABS: ORAL_TABLET | ORAL | 0 refills | Status: DC

## 2018-03-27 MED ORDER — KETOROLAC TROMETHAMINE 30 MG/ML IJ SOLN
15.0000 mg | Freq: Once | INTRAMUSCULAR | Status: AC
  Administered 2018-03-27: 15 mg via INTRAVENOUS
  Filled 2018-03-27: qty 1

## 2018-03-27 MED ORDER — AMOXICILLIN-POT CLAVULANATE 875-125 MG PO TABS: 1.0000 | ORAL_TABLET | Freq: Two times a day (BID) | ORAL | 0 refills | Status: AC

## 2018-03-27 NOTE — ED Triage Notes (Signed)
Pt brought in via ems from home.  Pt has chest pain and bilateral arm pain.  Pt has a cough.  Pt alert  Iv in place

## 2018-03-27 NOTE — Progress Notes (Signed)
[facility] Note  Date: 03/27/2018 Time: 21:15  I was contacted by Dr. Alfred Levins regarding EKG demonstrating ST segment elevation. She reports that Luis Mcknight presented to the emergency department with several day history of chest pain worsened with deep inspiration and recumbent positioning. I have personally reviewed his EKGs, which demonstrate sinus rhythm with ST elevation in the inferior leads, anterolateral leads,  as well as a lead I. Widespread PR depression is also present (as well as PR elevation in aVR). Given the patient's presenting symptoms, young age, and EKG findings, I favor this representing acute pericarditis and not an ST elevation MI. I have recommended empiric treatment for acute pericarditis. If symptoms are due to AMI, I would expect his TnI to be significantly elevated in the setting of symptom onset 3 days ago. If troponin is s ignificantly, cardiac catheterization will be reconsidered. Dr. Alfred Levins is in agreement with this plan.  Nelva Bush, MD [facility]

## 2018-03-27 NOTE — ED Provider Notes (Signed)
[facility] Emergency Department Provider Note  ____________________________________________  Time seen: Approximately 9:59 PM  I have reviewed the triage vital signs and the nursing notes.   HISTORY  Chief Complaint Chest Pain   HPI Luis Mcknight is a 38 y.o. male with a history of hypertension who presents for evaluation of chest pain.  Patient reports 3 to 4 days of constant sharp and pressure chest pain radiating to his upper back.  He has had a cough productive of clear phlegm with no hemoptysis.  No fever or chills.  Has had mild shortness of breath.  Chest pain is worse when he lays down and better when he leans forward.  No personal history of heart attacks, has family history of heart attack in his father.  No personal or family history of blood clots, recent travel immobilization, leg pain or swelling, hemoptysis, or exogenous hormones.  Patient is a smoker.  Denies drug use.  Past Medical History:  Diagnosis Date  . Hypertension    Past Surgical History:  Procedure Laterality Date  . NO PAST SURGERIES      Prior to Admission medications   Medication Sig Start Date End Date Taking? Authorizing Provider  amLODipine (NORVASC) 10 MG tablet Take 10 mg by mouth daily. 03/19/18  Yes [provider]  amoxicillin-clavulanate (AUGMENTIN) 875-125 MG tablet Take 1 tablet by mouth 2 (two) times daily for 10 days. 03/27/18 04/06/18  [name], MD  azithromycin [facility]) 250 MG tablet Take 1 a day for 4 days 03/27/18   Alfred Levins, [place], MD  colchicine 0.6 MG tablet Take 1 tablet (0.6 mg total) by mouth 2 (two) times daily for 10 days. 03/27/18 04/06/18  [name], MD  hydrochlorothiazide (MICROZIDE) 12.5 MG capsule Take 1 capsule (12.5 mg total) by mouth daily. Patient not taking: Reported on 03/27/2018 12/22/17 12/22/18  [name], MD  ibuprofen (ADVIL,MOTRIN) 600 MG tablet Take 1 tablet (600 mg total) by mouth every 6 (six)  hours as needed. 03/27/18   [name], MD  lisinopril (PRINIVIL,ZESTRIL) 10 MG tablet Take 1 tablet (10 mg total) by mouth daily. After 1 week, if blood pressure is still >140/90, may increase to 2 tablets by mouth daily. Patient not taking: Reported on 03/27/2018 08/30/17   [name], NP    Allergies Patient has no known allergies.  Family History  Problem Relation Age of Onset  . Hypertension Mother   . Diabetes Mother   . Diabetes Father   . Hypertension Father     Social History Social History   Tobacco Use  . Smoking status: Current Every Day Smoker    Packs/day: 1.00    Years: 15.00    Pack years: 15.00    Types: Cigarettes  . Smokeless tobacco: Never Used  Substance Use Topics  . Alcohol use: Yes    Comment: socially  . Drug use: No    Review of Systems  Constitutional: Negative for fever. Eyes: Negative for visual changes. ENT: Negative for sore throat. Neck: No neck pain  Cardiovascular: + chest pain. Respiratory: + shortness of breath and cough Gastrointestinal: Negative for abdominal pain, vomiting or diarrhea. Genitourinary: Negative for dysuria. Musculoskeletal: Negative for back pain. Skin: Negative for rash. Neurological: Negative for headaches, weakness or numbness. Psych: No SI or HI  ____________________________________________   PHYSICAL EXAM:  VITAL SIGNS: ED Triage Vitals [03/27/18 2041]  Enc Vitals Group     BP (!) 160/94     Pulse Rate (!) 108  Resp 20     Temp 99.1 F (37.3 C)     Temp Source Oral     SpO2 97 %     Weight 234 lb (106.1 kg)     Height 6\' 1"  (1.854 m)     Head Circumference      Peak Flow      Pain Score 9     Pain Loc      Pain Edu?      Excl. in [place]?     Constitutional: Alert and oriented. Well appearing and in no apparent distress. HEENT:      Head: Normocephalic and atraumatic.         Eyes: Conjunctivae are normal. Sclera is non-icteric.       Mouth/Throat: Mucous membranes are  moist.       Neck: Supple with no signs of meningismus. Cardiovascular: Tachycardic with regular rhythm. No murmurs, gallops, or rubs. 2+ symmetrical distal pulses are present in all extremities. No JVD. Respiratory: Normal respiratory effort. Lungs are clear to auscultation bilaterally. No wheezes, crackles, or rhonchi.  Gastrointestinal: Soft, non tender, and non distended with positive bowel sounds. No rebound or guarding. Musculoskeletal: Nontender with normal range of motion in all extremities. No edema, cyanosis, or erythema of extremities. Neurologic: Normal speech and language. Face is symmetric. Moving all extremities. No gross focal neurologic deficits are appreciated. Skin: Skin is warm, dry and intact. No rash noted. Psychiatric: Mood and affect are normal. Speech and behavior are normal.  ____________________________________________   LABS (all labs ordered are listed, but only abnormal results are displayed)  Labs Reviewed  BASIC METABOLIC PANEL - Abnormal; Notable for the following components:      Result Value   Calcium 8.6 (*)    All other components within normal limits  CBC - Abnormal; Notable for the following components:   MCH 25.9 (*)    All other components within normal limits  CULTURE, BLOOD (ROUTINE X 2)  CULTURE, BLOOD (ROUTINE X 2)  TROPONIN I  INFLUENZA PANEL BY PCR (TYPE A & B)  TROPONIN I   ____________________________________________  EKG  ED ECG REPORT I, [name], the attending physician, personally viewed and interpreted this ECG.  8:42 -sinus tachycardia, rate of 110, normal intervals, diffuse ST elevations in inferior and lateral leads with no reciprocal changes.  These findings are new when compared to prior.  9:03 -sinus tachycardia, rate of 112, normal intervals, normal axis, persistent diffuse ST elevations with no reciprocal changes and PR elevation in aVR. ____________________________________________  RADIOLOGY  I have  personally reviewed the images performed during this visit and I agree with the Radiologist's read.   Interpretation by Radiologist:  Dg Chest 2 View  Result Date: 03/27/2018 CLINICAL DATA:  37 y/o  M; chest pain and bilateral arm pain. Cough. EXAM: CHEST - 2 VIEW COMPARISON:  12/22/2017 chest radiograph. FINDINGS: Stable heart size and mediastinal contours are within normal limits. Both lungs are clear. Mild stable lower thoracic dextrocurvature. No acute osseous abnormality identified. IMPRESSION: No acute pulmonary process identified. Electronically Signed   By: Kristine Garbe M.D.   On: 03/27/2018 21:05   Ct Angio Chest Pe W And/or Wo Contrast  Result Date: 03/27/2018 CLINICAL DATA:  Chest pain EXAM: CT ANGIOGRAPHY CHEST WITH CONTRAST TECHNIQUE: Multidetector CT imaging of the chest was performed using the standard protocol during bolus administration of intravenous contrast. Multiplanar CT image reconstructions and MIPs were obtained to evaluate the vascular anatomy. CONTRAST:  116mL OMNIPAQUE  IOHEXOL 350 MG/ML SOLN COMPARISON:  Radiograph 03/27/2018 FINDINGS: Cardiovascular: Poor opacification of the pulmonary arteries limits evaluation for emboli. No acute central embolus is seen. Heart size within normal limits. Trace pericardial effusion. Nonaneurysmal aorta. Mediastinum/Nodes: Midline trachea. No thyroid mass. Multiple enlarged mediastinal lymph nodes. AP window lymph node measures 21 mm. Bilateral left greater than right hilar nodes, measuring up to 2 cm on the left and 17 mm on the right. Esophagus within normal limits. Lungs/Pleura: Nodular foci of airspace disease somewhat peripheral in the left upper lobe. No pleural effusion or pneumothorax. Upper Abdomen: No acute abnormality. Musculoskeletal: Scoliosis of the upper thoracic spine. No acute or suspicious bony abnormality Review of the MIP images confirms the above findings. IMPRESSION: 1. Limited evaluation for pulmonary emboli  secondary to poor contrast opacification of pulmonary arterial system. No gross acute central embolus seen. 2. Nodular areas of consolidation in the left upper lobe. Findings could be secondary to multiple foci of pneumonia. Given peripheral and subpleural distribution, could also consider early septic emboli although no cavitation seen at this time. 3. Mediastinal and hilar adenopathy, which may be secondary to reactive inflammation, infection, or lymphoproliferative disease Electronically Signed   By: Donavan Foil M.D.   On: 03/27/2018 22:11     ____________________________________________   PROCEDURES  Procedure(s) performed: None Procedures Critical Care performed: yes  CRITICAL CARE Performed by: [name]  ?  Total critical care time: 35 min  Critical care time was exclusive of separately billable procedures and treating other patients.  Critical care was necessary to treat or prevent imminent or life-threatening deterioration.  Critical care was time spent personally by me on the following activities: development of treatment plan with patient and/or surrogate as well as nursing, discussions with consultants, evaluation of patient's response to treatment, examination of patient, obtaining history from patient or surrogate, ordering and performing treatments and interventions, ordering and review of laboratory studies, ordering and review of radiographic studies, pulse oximetry and re-evaluation of patient's condition.  ____________________________________________   INITIAL IMPRESSION / ASSESSMENT AND PLAN / ED COURSE   38 y.o. male with a history of hypertension who presents for evaluation of chest pain, SOB and cough x 3- 4 days radiating to his upper back.  Patient with normal work of breathing, no hypoxia, he is tachycardic with a low-grade temp of 99.56F.  Did show EKG showing diffuse ST elevation with no reciprocal changes.  Based on history and EKG presentation is  most likely concerning for pericarditis however I did consult to Dr. Gerald Stabs End was on-call for cardiology and cath lab. He looked at the EKG and agrees the patient's presentation and EKG are more concerning of pericarditis.  Patient was given initially aspirin upon arrival.  He was then given IV Toradol and colchicine.  Was given fluids.  Chest x-ray was done with no evidence of pneumonia or pulmonary edema.  CT was done with no evidence of pulmonary emboli or pericardial effusion.  Influenza negative. Troponin negative therefore less likely myocarditis. Second troponin pending  Clinical Course as of Mar 27 2312  Tue Mar 27, 2018  2305 CT concerning for multifocal pneumonia versus septic emboli.  Patient has no history of IV drug use, no history intravenous access and no fever.  He has no track marks.  Blood cultures have been sent.  Will start patient on Rocephin and azithromycin for pneumonia.  No signs of sepsis otherwise.  Patient feels improved after colchicine and Toradol.  Anticipate discharge.   [CV]  Clinical Course User Index [CV] Alfred Levins [place], MD   _________________________ 11:15 PM on 03/27/2018 -----------------------------------------  Second troponin pending. Plan to re-evaluate after repeat troponin and patient remains with no signs of sepsis and no oxygen requirement plan to discharge home on antibiotics, colchicine, and ibuprofen for pericarditis and pneumonia.  Plan discussed with Dr. Mable Paris at 11 PM  As part of my medical decision making, I reviewed the following data within the [place] notes reviewed and incorporated, Labs reviewed , EKG interpreted , Old EKG reviewed, Old chart reviewed, Radiograph reviewed , A consult was requested and obtained from this/these consultant(s) Cardiology, Notes from prior ED visits and [place] Controlled Substance Database    Pertinent labs & imaging results that were available during my care of the patient  were reviewed by me and considered in my medical decision making (see chart for details).    ____________________________________________   FINAL CLINICAL IMPRESSION(S) / ED DIAGNOSES  Final diagnoses:  Multifocal pneumonia  Acute viral pericarditis      NEW MEDICATIONS STARTED DURING THIS VISIT:  ED Discharge Orders         Ordered    amoxicillin-clavulanate (AUGMENTIN) 875-125 MG tablet  2 times daily     03/27/18 2311    azithromycin (ZITHROMAX) 250 MG tablet     03/27/18 2311    colchicine 0.6 MG tablet  2 times daily     03/27/18 2311    ibuprofen (ADVIL,MOTRIN) 600 MG tablet  Every 6 hours PRN     03/27/18 2311           Note:  This document was prepared using Dragon voice recognition software and may include unintentional dictation errors.    Alfred Levins, [place], MD 03/27/18 [phone]

## 2018-03-27 NOTE — ED Notes (Signed)
Patient returned to room. 

## 2018-03-27 NOTE — ED Notes (Signed)
Meal tray given to patient.

## 2018-03-27 NOTE — ED Notes (Signed)
Patient arrived via [facility] from home. Patient states having chest pain hurting in center of chest radiating to neck and between shoulder blades. Per EMS they gave patient on 81 mg aspirin and 300ML bolus of NS. Patient states pain is worse when leaning forward and when he eats.

## 2018-03-27 NOTE — ED Notes (Signed)
Patient transported to CT 

## 2018-03-27 NOTE — Discharge Instructions (Addendum)
Please take antibiotics as prescribed. Take colchicine as prescribed. Take ibuprofen 600mg  every 6 hours for the next 3 days. Return to the ER for worsening chest pain, shortness of breath, or fever. Otherwise follow up with your doctor in 24-48 hours for re-evaluation.  Results for orders placed or performed during the hospital encounter of 56/81/27  Basic metabolic panel  Result Value Ref Range   Sodium 139 135 - 145 mmol/L   Potassium 3.5 3.5 - 5.1 mmol/L   Chloride 105 98 - 111 mmol/L   CO2 28 22 - 32 mmol/L   Glucose, Bld 93 70 - 99 mg/dL   BUN 14 6 - 20 mg/dL   Creatinine, Ser 0.93 0.61 - 1.24 mg/dL   Calcium 8.6 (L) 8.9 - 10.3 mg/dL   GFR calc non Af Amer >60 >60 mL/min   GFR calc Af Amer >60 >60 mL/min   Anion gap 6 5 - 15  CBC  Result Value Ref Range   WBC 7.8 4.0 - 10.5 K/uL   RBC 5.18 4.22 - 5.81 MIL/uL   Hemoglobin 13.4 13.0 - 17.0 g/dL   HCT 41.8 39.0 - 52.0 %   MCV 80.7 80.0 - 100.0 fL   MCH 25.9 (L) 26.0 - 34.0 pg   MCHC 32.1 30.0 - 36.0 g/dL   RDW 12.8 11.5 - 15.5 %   Platelets 303 150 - 400 K/uL   nRBC 0.0 0.0 - 0.2 %  Troponin I - ONCE - STAT  Result Value Ref Range   Troponin I <0.03 <0.03 ng/mL  Influenza panel by PCR (type A & B)  Result Value Ref Range   Influenza A By PCR NEGATIVE NEGATIVE   Influenza B By PCR NEGATIVE NEGATIVE   Dg Chest 2 View  Result Date: 03/27/2018 CLINICAL DATA:  38 y/o  M; chest pain and bilateral arm pain. Cough. EXAM: CHEST - 2 VIEW COMPARISON:  12/22/2017 chest radiograph. FINDINGS: Stable heart size and mediastinal contours are within normal limits. Both lungs are clear. Mild stable lower thoracic dextrocurvature. No acute osseous abnormality identified. IMPRESSION: No acute pulmonary process identified. Electronically Signed   By: Kristine Garbe M.D.   On: 03/27/2018 21:05   Ct Angio Chest Pe W And/or Wo Contrast  Result Date: 03/27/2018 CLINICAL DATA:  Chest pain EXAM: CT ANGIOGRAPHY CHEST WITH CONTRAST  TECHNIQUE: Multidetector CT imaging of the chest was performed using the standard protocol during bolus administration of intravenous contrast. Multiplanar CT image reconstructions and MIPs were obtained to evaluate the vascular anatomy. CONTRAST:  172mL OMNIPAQUE IOHEXOL 350 MG/ML SOLN COMPARISON:  Radiograph 03/27/2018 FINDINGS: Cardiovascular: Poor opacification of the pulmonary arteries limits evaluation for emboli. No acute central embolus is seen. Heart size within normal limits. Trace pericardial effusion. Nonaneurysmal aorta. Mediastinum/Nodes: Midline trachea. No thyroid mass. Multiple enlarged mediastinal lymph nodes. AP window lymph node measures 21 mm. Bilateral left greater than right hilar nodes, measuring up to 2 cm on the left and 17 mm on the right. Esophagus within normal limits. Lungs/Pleura: Nodular foci of airspace disease somewhat peripheral in the left upper lobe. No pleural effusion or pneumothorax. Upper Abdomen: No acute abnormality. Musculoskeletal: Scoliosis of the upper thoracic spine. No acute or suspicious bony abnormality Review of the MIP images confirms the above findings. IMPRESSION: 1. Limited evaluation for pulmonary emboli secondary to poor contrast opacification of pulmonary arterial system. No gross acute central embolus seen. 2. Nodular areas of consolidation in the left upper lobe. Findings could be secondary to multiple  foci of pneumonia. Given peripheral and subpleural distribution, could also consider early septic emboli although no cavitation seen at this time. 3. Mediastinal and hilar adenopathy, which may be secondary to reactive inflammation, infection, or lymphoproliferative disease Electronically Signed   By: Donavan Foil M.D.   On: 03/27/2018 22:11

## 2018-03-27 NOTE — ED Notes (Signed)
Patient transported to X-ray 

## 2018-03-28 LAB — TROPONIN I: Troponin I: <0.03 ng/mL (ref <0.03)

## 2018-03-30 NOTE — ED Notes (Signed)
Patient called and says he cannot get the colchicine due to not on medicaid preferred list.  Per dr Corky Downs we can change to probenacid 0.5gm/colchicine 0.5 mg--take one daily for 1 week.  I called this in to [facility] in [place] and told patient to follow up with pcp about further medication.  He agrees.

## 2018-04-01 LAB — CULTURE, BLOOD (ROUTINE X 2)
CULTURE: NO GROWTH
Culture: NO GROWTH

## 2018-04-02 NOTE — ED Notes (Signed)
Patient called asking for blood work results.  He had blood cultures which did not grow in 5 days.  Explained this to patient and that all other tests were resulted while he was in the ED.  He asked about his sugar specifically and I told him the value.

## 2018-07-30 ENCOUNTER — Telehealth: Payer: Medicaid Other | Admitting: Family

## 2018-07-30 DIAGNOSIS — R05 Cough: Secondary | ICD-10-CM

## 2018-07-30 DIAGNOSIS — R059 Cough, unspecified: Secondary | ICD-10-CM

## 2018-07-30 NOTE — Progress Notes (Signed)
Based on what you shared with me, I feel your condition warrants further evaluation and I recommend that you be seen for a face to face office visit.  Given you were treated for pneumonia and I do not see a follow up visit. You need to be seen for repeat cheat x-ray.   NOTE: If you entered your credit card information for this eVisit, you will not be charged. You may see a "hold" on your card for the $35 but that hold will drop off and you will not have a charge processed.  If you are having a true medical emergency please call 911.  If you need an urgent face to face visit, [place] has four urgent care centers for your convenience.    PLEASE NOTE: THE [facility] LOCATIONS AND URGENT CARE CLINICS DO NOT HAVE THE TESTING FOR CORONAVIRUS COVID19 AVAILABLE.  IF YOU FEEL YOU NEED THIS TEST YOU MUST GO TO A TRIAGE LOCATION AT [place]   [email or web address] to reserve your spot online an avoid wait times  [facility] [address], [address] [place], [place] [address] Modified hours of operation: Monday-Friday, 12 PM to 6 PM  Saturday & Sunday 10 AM to 4 PM *Across the street from [place] (New Address!) [address], [place], [place] [address] *Just off [address], across the road from [place] hours of operation: Monday-Friday, 12 PM to 6 PM  Closed Saturday & Sunday  [facility]'s modified hours of operation will be in effect from May 1 until May 31   The following sites will take your insurance:  . [facility] [place] a Provider at this Location  [address] [place], [place] [address] . 10 am to 8 pm Monday-Friday . 12 pm to 8 pm Saturday-Sunday   . [facility] at [place] a Provider at this Location  [address], [place]  [place], [place] [address] . 8 am to 8 pm Monday-Friday . 9 am to 6 pm Saturday . 11 am to 6 pm Sunday   . [facility] at [place] Get Driving Directions  [address] [place], [place] [address] . 8 am to 8 pm Monday-Friday . 8 am to 4 pm Saturday-Sunday   Your e-visit answers were reviewed by a board certified advanced clinical practitioner to complete your personal care plan.  Thank you for using e-Visits.

## 2018-08-27 ENCOUNTER — Ambulatory Visit
Admission: EM | Admit: 2018-08-27 | Discharge: 2018-08-27 | Disposition: A | Discharge status: Home / Self Care | Payer: Medicaid Other | Attending: Emergency Medicine | Admitting: Emergency Medicine

## 2018-08-27 ENCOUNTER — Other Ambulatory Visit: Payer: Self-pay

## 2018-08-27 ENCOUNTER — Ambulatory Visit: Payer: Medicaid Other

## 2018-08-27 ENCOUNTER — Encounter: Payer: Self-pay | Admitting: Emergency Medicine

## 2018-08-27 DIAGNOSIS — R079 Chest pain, unspecified: Secondary | ICD-10-CM

## 2018-08-27 DIAGNOSIS — K1370 Unspecified lesions of oral mucosa: Secondary | ICD-10-CM | POA: Diagnosis not present

## 2018-08-27 DIAGNOSIS — J181 Lobar pneumonia, unspecified organism: Secondary | ICD-10-CM | POA: Insufficient documentation

## 2018-08-27 DIAGNOSIS — F17210 Nicotine dependence, cigarettes, uncomplicated: Secondary | ICD-10-CM | POA: Diagnosis not present

## 2018-08-27 DIAGNOSIS — J189 Pneumonia, unspecified organism: Secondary | ICD-10-CM

## 2018-08-27 MED ORDER — AMOXICILLIN-POT CLAVULANATE 875-125 MG PO TABS: 1.0000 | ORAL_TABLET | Freq: Two times a day (BID) | ORAL | 0 refills | Status: AC

## 2018-08-27 MED ORDER — NYSTATIN 100000 UNIT/ML MT SUSP: 500000.0000 [IU] | Freq: Four times a day (QID) | OROMUCOSAL | 0 refills | Status: DC

## 2018-08-27 MED ORDER — DOXYCYCLINE HYCLATE 100 MG PO CAPS: 100.0000 mg | ORAL_CAPSULE | Freq: Two times a day (BID) | ORAL | 0 refills | Status: DC

## 2018-08-27 NOTE — ED Provider Notes (Addendum)
[facility]    CSN: [id number] Arrival date & time: 08/27/18  1537      History   Chief Complaint Chief Complaint  Patient presents with  . Chest Pain    HPI Luis Mcknight is a 38 y.o. male.   Luis Mcknight presents with complaints of left sided chest pain which radiates to back. Stabbing pain. No shortness of breath  But with productive cough. This has been ongoing for two months now. He was seen in Er and treated for pna and pericarditis 03/27/18. States symptoms improved briefly before returning. He does smoke approximately 10 cigarettes a day. No drug use. Doesn't follow with a PCP or cardiologist. No sore throat, ear pain, nasal congestion. States he feels like he has had oral thrush with white coating to his mouth and tongue, he scrapes it off while brushing his teeth. Was treated with nystatin lozenges from his dentist but symptoms returned. No known covid exposures. No known ill contacts. No palpitations.no episodes of diaphoresis or nausea. Hx of htn.    ROS per HPI, negative if not otherwise mentioned.      Past Medical History:  Diagnosis Date  . Hypertension     There are no active problems to display for this patient.   Past Surgical History:  Procedure Laterality Date  . NO PAST SURGERIES         Home Medications    Prior to Admission medications   Medication Sig Start Date End Date Taking? Authorizing Provider  amLODipine (NORVASC) 10 MG tablet Take 10 mg by mouth daily. 03/19/18  Yes [provider]  amoxicillin-clavulanate (AUGMENTIN) 875-125 MG tablet Take 1 tablet by mouth every 12 (twelve) hours for 10 days. 08/27/18 09/06/18  [name], NP  colchicine 0.6 MG tablet Take 1 tablet (0.6 mg total) by mouth 2 (two) times daily for 10 days. 03/27/18 04/06/18  [name], MD  doxycycline (VIBRAMYCIN) 100 MG capsule Take 1 capsule (100 mg total) by mouth 2 (two) times daily. 08/27/18   [name], NP   hydrochlorothiazide (MICROZIDE) 12.5 MG capsule Take 1 capsule (12.5 mg total) by mouth daily. Patient not taking: Reported on 03/27/2018 12/22/17 12/22/18  [name], MD  ibuprofen (ADVIL,MOTRIN) 600 MG tablet Take 1 tablet (600 mg total) by mouth every 6 (six) hours as needed. 03/27/18   [name], MD  lisinopril (PRINIVIL,ZESTRIL) 10 MG tablet Take 1 tablet (10 mg total) by mouth daily. After 1 week, if blood pressure is still >140/90, may increase to 2 tablets by mouth daily. Patient not taking: Reported on 03/27/2018 08/30/17   [name], NP  nystatin (MYCOSTATIN) 100000 UNIT/ML suspension Take 5 mLs (500,000 Units total) by mouth 4 (four) times daily. Swish and spit 08/27/18   [name], NP    Family History Family History  Problem Relation Age of Onset  . Hypertension Mother   . Diabetes Mother   . Diabetes Father   . Hypertension Father     Social History Social History   Tobacco Use  . Smoking status: Current Every Day Smoker    Packs/day: 1.00    Years: 15.00    Pack years: 15.00    Types: Cigarettes  . Smokeless tobacco: Never Used  Substance Use Topics  . Alcohol use: Yes    Comment: socially  . Drug use: No     Allergies   Patient has no known allergies.   Review of Systems Review of Systems  Physical Exam Triage Vital Signs ED Triage Vitals  Enc Vitals Group     BP 08/27/18 1605 (!) 129/93     Pulse Rate 08/27/18 1605 85     Resp 08/27/18 1605 18     Temp 08/27/18 1605 98.6 F (37 C)     Temp Source 08/27/18 1605 Oral     SpO2 08/27/18 1605 100 %     Weight 08/27/18 1601 230 lb (104.3 kg)     Height 08/27/18 1601 6\' 1"  (1.854 m)     Head Circumference --      Peak Flow --      Pain Score 08/27/18 1601 5     Pain Loc --      Pain Edu? --      Excl. in [place]? --    No data found.  Updated Vital Signs BP (!) 129/93 (BP Location: Left Arm)   Pulse 85   Temp 98.6 F (37 C) (Oral)   Resp 18   Ht 6\' 1"  (1.854 m)   Wt  230 lb (104.3 kg)   SpO2 100%   BMI 30.34 kg/m   Visual Acuity Right Eye Distance:   Left Eye Distance:   Bilateral Distance:    Right Eye Near:   Left Eye Near:    Bilateral Near:     Physical Exam Constitutional:      Appearance: He is well-developed.  HENT:     Mouth/Throat:     Mouth: Mucous membranes are moist.     Comments: Very little thin white coating to buccal mucosa without redness or swelling noted Cardiovascular:     Rate and Rhythm: Normal rate and regular rhythm.  Pulmonary:     Effort: Pulmonary effort is normal.  Skin:    General: Skin is warm and dry.  Neurological:     Mental Status: He is alert and oriented to person, place, and time.    EKG:  NSR rate 82 . Previous EKG was available for review. No stwave changes as interpreted by me.    UC Treatments / Results  Labs (all labs ordered are listed, but only abnormal results are displayed) Labs Reviewed  HIV ANTIBODY (ROUTINE TESTING W REFLEX)    EKG None  Radiology Dg Chest 2 View  Result Date: 08/27/2018 CLINICAL DATA:  Chest pain.  History of vaping EXAM: CHEST - 2 VIEW COMPARISON:  Chest radiograph March 27, 2018 and chest CT March 27, 2018 FINDINGS: There is patchy airspace opacity in the left upper lobe consistent with pneumonia. The lungs elsewhere are clear. There is adenopathy in the right hilar region. There is questionable adenopathy in the aortopulmonary window region, better seen on prior CT. No bone lesions. No pneumothorax. IMPRESSION: 1.  Apparent pneumonia in the left upper lobe. 2. Adenopathy of uncertain etiology, also present on prior CT. Neoplastic etiology cannot be excluded for this adenopathy. These results will be called to the ordering clinician or representative by the Radiologist Assistant, and communication documented in the PACS or zVision Dashboard. Electronically Signed   By: Lowella Grip III M.D.   On: 08/27/2018 16:39    Procedures Procedures (including  critical care time)  Medications Ordered in UC Medications - No data to display  Initial Impression / Assessment and Plan / UC Course  I have reviewed the triage vital signs and the nursing notes.  Pertinent labs & imaging results that were available during my care of the patient were reviewed by me and considered  in my medical decision making (see chart for details).     Still with PNA on chest xray today. Neoplasm can't be excluded. Similar to CT from January of this year, despite treatment with antibiotics. Will treat again for CAP but emphasized significance of follow up for recheck. Thrush? hiv screen completed as well. Return precautions provided. Patient verbalized understanding and agreeable to plan.   Final Clinical Impressions(s) / UC Diagnoses   Final diagnoses:  Pneumonia of left upper lobe due to infectious organism [facility])  Unspecified lesions of oral mucosa     Discharge Instructions     There are limitations to chest xray therefore it has become imperative that you follow up with a primary care provider in the next month for recheck as you many need further evaluation of your lungs.  Please try to decrease to quit smoking.  Complete course of antibiotics.  Swish and spit nystatin for any thrush, although your mouth does look well today.  Please go to the ER for any worsening of chest pain , shortness of breath , increased work of breathing.    ED Prescriptions    Medication Sig Dispense Auth. Provider   amoxicillin-clavulanate (AUGMENTIN) 875-125 MG tablet Take 1 tablet by mouth every 12 (twelve) hours for 10 days. 20 tablet [name], NP   doxycycline (VIBRAMYCIN) 100 MG capsule Take 1 capsule (100 mg total) by mouth 2 (two) times daily. 20 capsule [name], NP   nystatin (MYCOSTATIN) 100000 UNIT/ML suspension Take 5 mLs (500,000 Units total) by mouth 4 (four) times daily. Swish and spit 60 mL [name], NP     Controlled Substance  Prescriptions [place] Controlled Substance Registry consulted? Not Applicable   [name], NP 08/27/18 2226    [name], NP 08/27/18 2228

## 2018-08-27 NOTE — Discharge Instructions (Addendum)
There are limitations to chest xray therefore it has become imperative that you follow up with a primary care provider in the next month for recheck as you many need further evaluation of your lungs.  Please try to decrease to quit smoking.  Complete course of antibiotics.  Swish and spit nystatin for any thrush, although your mouth does look well today.  Please go to the ER for any worsening of chest pain , shortness of breath , increased work of breathing.

## 2018-08-27 NOTE — ED Triage Notes (Signed)
Pt c/o chest pain on the left side.he states it is a sharp stabbing pain. He also has pain in his shoulder blades and lower back. He was seen for "fluid around my heart" about 4 months ago and had this same pain. He states that he pain never completely went away but came back worse for the last month. Denies shortness of breath, sweats, or nausea. Family history of heart attack in his father.

## 2018-08-29 LAB — HIV ANTIBODY (ROUTINE TESTING W REFLEX): HIV Screen 4th Generation wRfx: NONREACTIVE

## 2018-09-27 ENCOUNTER — Ambulatory Visit
Admit: 2018-09-27 | Discharge: 2018-09-27 | Disposition: A | Discharge status: Home / Self Care | Payer: Medicaid Other | Attending: Urgent Care | Admitting: Urgent Care

## 2018-09-27 ENCOUNTER — Encounter: Payer: Self-pay | Admitting: Emergency Medicine

## 2018-09-27 ENCOUNTER — Other Ambulatory Visit: Payer: Self-pay

## 2018-09-27 ENCOUNTER — Ambulatory Visit
Admission: EM | Admit: 2018-09-27 | Discharge: 2018-09-27 | Disposition: A | Discharge status: Home / Self Care | Payer: Medicaid Other | Attending: Urgent Care | Admitting: Urgent Care

## 2018-09-27 ENCOUNTER — Ambulatory Visit: Payer: Medicaid Other

## 2018-09-27 DIAGNOSIS — J189 Pneumonia, unspecified organism: Secondary | ICD-10-CM | POA: Insufficient documentation

## 2018-09-27 DIAGNOSIS — F17210 Nicotine dependence, cigarettes, uncomplicated: Secondary | ICD-10-CM | POA: Insufficient documentation

## 2018-09-27 DIAGNOSIS — R058 Other specified cough: Secondary | ICD-10-CM

## 2018-09-27 DIAGNOSIS — R9389 Abnormal findings on diagnostic imaging of other specified body structures: Secondary | ICD-10-CM

## 2018-09-27 DIAGNOSIS — Z20828 Contact with and (suspected) exposure to other viral communicable diseases: Secondary | ICD-10-CM | POA: Diagnosis not present

## 2018-09-27 DIAGNOSIS — R05 Cough: Secondary | ICD-10-CM

## 2018-09-27 DIAGNOSIS — J181 Lobar pneumonia, unspecified organism: Secondary | ICD-10-CM

## 2018-09-27 DIAGNOSIS — R079 Chest pain, unspecified: Secondary | ICD-10-CM | POA: Diagnosis not present

## 2018-09-27 DIAGNOSIS — Z79899 Other long term (current) drug therapy: Secondary | ICD-10-CM | POA: Diagnosis not present

## 2018-09-27 DIAGNOSIS — I10 Essential (primary) hypertension: Secondary | ICD-10-CM | POA: Diagnosis not present

## 2018-09-27 DIAGNOSIS — Z8249 Family history of ischemic heart disease and other diseases of the circulatory system: Secondary | ICD-10-CM | POA: Diagnosis not present

## 2018-09-27 DIAGNOSIS — Z833 Family history of diabetes mellitus: Secondary | ICD-10-CM | POA: Diagnosis not present

## 2018-09-27 DIAGNOSIS — R918 Other nonspecific abnormal finding of lung field: Secondary | ICD-10-CM | POA: Diagnosis not present

## 2018-09-27 DIAGNOSIS — R590 Localized enlarged lymph nodes: Secondary | ICD-10-CM

## 2018-09-27 MED ORDER — AZITHROMYCIN 250 MG PO TABS: 250.0000 mg | ORAL_TABLET | Freq: Every day | ORAL | 0 refills | Status: DC

## 2018-09-27 NOTE — ED Provider Notes (Signed)
[place], [place]   Name: Luis Mcknight DOB: 1981/02/13 MRN: [id number] CSN: [id number] PCP: Ferrel Logan, PA-C  Arrival date and time:  09/27/18 1427  Chief Complaint:  Cough   NOTE: Prior to seeing the patient today, I have reviewed the triage nursing documentation and vital signs. Clinical staff has updated patient's PMH/PSHx, current medication list, and drug allergies/intolerances to ensure comprehensive history available to assist in medical decision making.   History:   HPI: Luis Mcknight is a 38 y.o. male who presents today with complaints of persistent cough and intermittent pain in his LEFT lateral chest wall. He denies any fevers. Cough is productive of thick yellow sputum. He denies any significant shortness of breath, sore throat, or pain in his ears. Patient continues to smoke approximately 0.5 - 1 ppd (cigarettes) a day and vapes.   Patient was diagnosed with CAP on 08/27/2018 in his [facility]. At that time he was short of breath and complained of "stabbing" pain in his chest. Patient notes that his the pain in his lateral chest wall today is much less intense, and only occurs "from time to time", as compared to being sharp and persistent a month ago. 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 Patient states, "I don't think it worked because I have used it so much". In review of office note from last visit, it appears as if patient was treated with concurrent 10 day courses of both Augmentin and doxycycline.   Despite the antibiotics, patient advising that he "never really got better". Patient denies any recent weight loss, episodes of hemoptysis, nocturnal diaphoresis, and recent travel. He has not been in close contact with anyone known to be ill. Initially he advised that he had been tested for SARS-CoV-2 (novel coronavirus), however after reviewing history details with the patient, he misunderstood  and thought that I was speaking of testing related to his pneumonia. He has not been tested for SARS-CoV-2 at this point.   Past Medical History:  Diagnosis Date   Hypertension     Past Surgical History:  Procedure Laterality Date   NO PAST SURGERIES      Family History  Problem Relation Age of Onset   Hypertension Mother    Diabetes Mother    Diabetes Father    Hypertension Father     Social History   Tobacco Use   Smoking status: Current Every Day Smoker    Packs/day: 1.00    Years: 15.00    Pack years: 15.00    Types: Cigarettes   Smokeless tobacco: Never Used  Substance Use Topics   Alcohol use: Yes    Comment: socially   Drug use: No    There are no active problems to display for this patient.   Home Medications:    Current Meds  Medication Sig   amLODipine (NORVASC) 10 MG tablet Take 10 mg by mouth daily.    Allergies:   Patient has no known allergies.  Review of Systems (ROS): Review of Systems  Constitutional: Positive for fatigue. Negative for chills, diaphoresis, fever and unexpected weight change.  HENT: Positive for congestion. Negative for ear pain, rhinorrhea, sinus pressure, sinus pain, sneezing, sore throat and trouble swallowing.   Respiratory: Positive for cough (productive; yellow sputum). Negative for chest tightness, shortness of breath and wheezing.   Cardiovascular: Negative for chest pain and palpitations.  Gastrointestinal: Negative for abdominal pain, constipation, diarrhea, nausea and vomiting.  Musculoskeletal: Negative for arthralgias, back pain, myalgias and neck pain.       LEFT lateral chest wall (ribs) pain; intermittent  Skin: Negative for color change, pallor and rash.  Neurological: Negative for dizziness, syncope, weakness, light-headedness, numbness and headaches.  Hematological: Negative for adenopathy.  All other systems reviewed and are negative.    Vital Signs: Today's Vitals   09/27/18 1437  09/27/18 1438 09/27/18 1731  BP: (!) 134/96    Pulse: 94    Resp: 18    Temp: 98.3 F (36.8 C)    TempSrc: Oral    SpO2: 100%    Weight:  230 lb (104.3 kg)   Height:  6\' 1"  (1.854 m)   PainSc: 0-No pain  0-No pain    Physical Exam: Physical Exam  Constitutional: He is oriented to person, place, and time and well-developed, well-nourished, and in no distress. He appears not lethargic.  Non-toxic appearance.  HENT:  Head: Normocephalic and atraumatic.  Right Ear: Hearing, tympanic membrane, external ear and ear canal normal.  Left Ear: Hearing, tympanic membrane, external ear and ear canal normal.  Nose: Mucosal edema present. No rhinorrhea or sinus tenderness.  Mouth/Throat: Uvula is midline, oropharynx is clear and moist and mucous membranes are normal. No oropharyngeal exudate, posterior oropharyngeal edema or posterior oropharyngeal erythema.  Cardiovascular: Normal rate, regular rhythm, normal heart sounds and intact distal pulses. Exam reveals no gallop and no friction rub.  No murmur heard. Pulmonary/Chest: Effort normal. No accessory muscle usage. No respiratory distress. He has decreased breath sounds in the left lower field. He has no wheezes. He has rhonchi in the left upper field. He has no rales. He exhibits tenderness. He exhibits no mass, no crepitus, no deformity and no retraction.  Neurological: He is alert and oriented to person, place, and time. He appears not lethargic. Gait normal. GCS score is 15.  Skin: Skin is warm and dry. No rash noted.  Psychiatric: Mood, memory, affect and judgment normal.  Nursing note and vitals reviewed.   Urgent Care Treatments / Results:   LABS: PLEASE NOTE: all labs that were ordered this encounter are listed, however only abnormal results are displayed. Labs Reviewed  NOVEL CORONAVIRUS, NAA (HOSPITAL ORDER, SEND-OUT TO REF LAB)  QUANTIFERON-TB GOLD PLUS    EKG: -None  RADIOLOGY: Dg Chest 2 View  Result Date:  09/27/2018 CLINICAL DATA:  Diagnosed with pneumonia recently. Persistent cough and intermittent left chest pain. EXAM: CHEST - 2 VIEW COMPARISON:  08/27/2018, 03/27/2018 and CT 03/27/2018 FINDINGS: Lungs are adequately inflated demonstrate persistent hazy mixed interstitial airspace density over the left mid to upper lung unchanged to slightly worse. No effusion. Mild prominence of the hilar regions unchanged known to represent adenopathy as seen on previous CT. Cardiomediastinal silhouette and remainder of the exam is unchanged. IMPRESSION: Hazy mixed interstitial airspace density over the left mid to upper lung unchanged to slightly worse compared to 08/27/2018 and worse compared to January 2020. Findings likely represent persistent atypical infectious or inflammatory process. Stable known bilateral hilar adenopathy. Repeat CT scan may be helpful for further characterization of this process. Electronically Signed   By: Marin Olp M.D.   On: 09/27/2018 15:30   Ct Chest Wo Contrast  Result Date: 09/27/2018 CLINICAL DATA:  38 year old male with history of cough. Persistent pneumonia. EXAM: CT CHEST WITHOUT CONTRAST TECHNIQUE: Multidetector CT imaging of the chest was performed following the standard protocol without IV contrast. COMPARISON:  Chest CT 03/27/2018. FINDINGS: Cardiovascular: Heart size is  normal. There is no significant pericardial fluid, thickening or pericardial calcification. No atherosclerotic calcifications in the thoracic aorta or the coronary arteries. Mediastinum/Nodes: Multiple prominent borderline enlarged and enlarged mediastinal and bilateral hilar lymph nodes, similar to the prior examination. The largest of these include an AP window lymph node (1.7 cm short axis) and subcarinal lymph node (2.3 cm in short axis). Esophagus is unremarkable in appearance. No axillary lymphadenopathy. Lungs/Pleura: Widespread micro nodularity is noted in the left upper lobe, significantly progressed  compared to the prior study. 2 larger nodules are again noted, similar to the prior examination. These measure 2.0 x 1.4 cm in the left upper lobe (axial image 49 of series 3) and 2.3 x 1.7 cm (axial image 58 of series 3). A few other scattered micronodules are noted in the right lung. No pleural effusions. Upper Abdomen: Unremarkable. Musculoskeletal: There are no aggressive appearing lytic or blastic lesions noted in the visualized portions of the skeleton. IMPRESSION: 1. Unusual findings of progressive nodularity throughout the left upper lobe with associated mediastinal and bilateral hilar lymphadenopathy. These findings are very unusual, but are presumably of infectious etiology in this young patient. Neoplasm is not excluded, but not strongly favored. Further clinical evaluation is recommended. Electronically Signed   By: Vinnie Langton M.D.   On: 09/27/2018 16:46    PROCEDURES: Procedures  MEDICATIONS RECEIVED THIS VISIT: Medications - No data to display  PERTINENT CLINICAL COURSE NOTES/UPDATES: Clinical Course as of Sep 26 2133  Thu Sep 27, 2018  1540 CXR results reviewed. CT recommended. Discussed with attending physician who advised to proceed as recommended by radiologist.    [BG]  1647 CT results reviewed. Infectious etiology suspected. Discussed POC with attending who agreed with outpatient treatment with ABX and referral to PCCM.    [BG]  1700 Spoke with PCCM provider Alva Garnet, MD) regarding presentation and imaging today. Imaging reviewed. Concern for infectious granulomatous disease (? TB) vs. malignancy. Recommendations received (labs, ABX, referral).    [BG]    Clinical Course User Index [BG] Karen Kitchens, NP   Initial Impression / Assessment and Plan / Urgent Care Course:  Pertinent labs & imaging results that were available during my care of the patient were personally reviewed by me and considered in my medical decision making (see lab/imaging section of note for values  and interpretations).  Luis Mcknight is a 38 y.o. male who presents to [facility] today with complaints of Cough   Patient is well appearing overall in clinic today. He does not appear to be in any acute distress; respirations even and non-labored, no increased WOB, or difficulty speaking in complete sentences. VSS; afebrile with a SPO2 of 100% on RA. Presenting symptoms (see HPI) and exam as documented above. Diagnostic radiographs of the chest revealed interstitial airspace density over the LEFT upper lobe that is slightly worse as compared to previous radiograph from 08/27/2018. Findings felt to be consistent with atypical infectious process. Hilar adenopathy was felt to be stable. Repeat CT was recommended and subsequently performed. Non-contrast CT imaging revealed significantly progressed nodularity throughout the LEFT upper lobe with associated mediastinal and BILATERAL hilar lymphadenopathy:  AP window lymph node measuring 1.7 cm  Subcarinal lymph node measuring 2.3 cm  LEFT upper lobe pulmonary nodule measuring 2.0 x 1.4 cm  LEFT upper lobe pulmonary nodule measuring 2.3 x 1.7 cm  Scattered micronodules in the RIGHT lung Findings very unusual per radiologist, however given his age, they are felt to be of an infectious/inflammatory  etiology. Radiologist indicating that pulmonary neoplasm cannot not be excluded. Findings reviewed with attending urgent care provider Lacinda Axon, DO) and proposed plan of care discussed. Reviewed presenting complaints, exam, and workup today. DO agrees with plans for outpatient treatment with additional oral antimicrobial therapy (levofloxacin) and referral to PCCM.  Call placed to PCCM provider Alva Garnet, MD); see above for timeline. Again, presenting HPI, exam, and workup reviewed with physician provider. PCCM raising concern for granulomatous disease. Dr. Alva Garnet requesting that quantiferon gold testing to be sent to rule out tuberculosis, and that  SARS-CoV-2 (novel coronavirus) status be confirmed. Again, after reviewing with patient again, it was determined that he has not been tested. SARS-CoV-2 testing collected today via facility approved self swab process today under the supervision of certified clinical staff. MD aware that patient has been treated already with 10 day courses of Augmentin and doxycycline without noted improvement. MD asking that patient be prescribed a 5 day course of azithromycin, rather than levofloxacin, be prescribed to further cover for atypical pulmonary pathogens. Dr. Alva Garnet notes plans to have his office contact patient tomorrow to discuss in office consult to discuss need for diagnostic bronchoscopy next week. Encouraged supportive care; rest, increased hydration, and APAP/IBU as needed. Plan of care discussed at length with patient. He verbalizes understanding and agreement with plan of care as it has been discussed with him.  Discussed smoking cessation. He confirms that he has been advised in the past to quit, however despite education, he continues to smoke cigarettes and vape. He has an approximate 15 pack year smoking history. Given his history of vaping, there is also concern for potential EVALI contributing to his current symptom constellation and abnormal findings of CT imaging of his chest. Patient encouraged to make every effort to stop both smoking and vaping. He was directed to over the counter aides (gums, lozenges, patches) to assist with his cessation efforts. If not effective, will discuss prescription intervention (varinicline) with PCP and/or PCCM.   I have reviewed the follow up and strict return precautions for any new or worsening symptoms. Patient is aware of symptoms that would be deemed urgent/emergent, and would thus require further evaluation either here or in the emergency department. At the time of discharge, he verbalized understanding and consent with the discharge plan as it was reviewed  with him. All questions were fielded by provider and/or clinic staff prior to patient discharge.    Final Clinical Impressions / Urgent Care Diagnoses:   Final diagnoses:  Abnormal chest CT  Community acquired pneumonia of left upper lobe of lung ([place])  Smokes 1/2 pack per day  Productive cough  Multiple lung nodules on CT  Hilar lymphadenopathy    New Prescriptions:  [place] Controlled Substance Registry consulted? Not Applicable  Meds ordered this encounter  Medications   azithromycin (ZITHROMAX) 250 MG tablet    Sig: Take 1 tablet (250 mg total) by mouth daily. Take first 2 tablets together, then 1 every day until finished.    Dispense:  6 tablet    Refill:  0    Recommended Follow up Care:  Patient encouraged to follow up with the following provider within the specified time frame, or sooner as dictated by the severity of his symptoms. As always, he was instructed that for any urgent/emergent care needs, he should seek care either here or in the emergency department for more immediate evaluation.  Follow-up Information    Call  Wilhelmina Mcardle, MD.   Specialty: Pulmonary Disease Why: Office  will contact you to schedule an appointment. Contact information: [place] [id number] [phone]         NOTE: This note was prepared using Dragon dictation software along with smaller phrase technology. Despite my best ability to proofread, there is the potential that transcriptional errors may still occur from this process, and are completely unintentional.     Karen Kitchens, NP 09/27/18 2136

## 2018-09-27 NOTE — Discharge Instructions (Addendum)
It was very nice seeing you today in clinic. Thank you for entrusting me with your care.   Please utilize the medications that we discussed. Your prescriptions have been called in to your pharmacy. If you have any problems, you will need to be seen here or in the ED. May use Tylenol and/or Ibuprofen as needed for pain/fever. STOP SMOKING AND VAPING!  You need to make plans to establish local care with a provider. ALSO, need to have you see the pulmonologist in order to discuss follow up on the enlarged lymph nodes in your lungs. They will contact you this week to schedule the appointment. Please remember, our [place] providers are "right here with you" when you need [place].   Again, it was my pleasure to take care of you today. Thank you for choosing our clinic. I hope that you start to feel better quickly.   Honor Loh, MSN, APRN, FNP-C, CEN Advanced Practice Provider [facility]

## 2018-09-27 NOTE — ED Triage Notes (Signed)
Pt reports recently dx with pneumonia, reports taking medications as needed. Continued yellow productive cough and intermittent pain to left lower rib area. Pt reports + smoker, was prescribed amoxicillin frequently for dental work and was prescribed that for pneumonia last time.

## 2018-09-29 LAB — NOVEL CORONAVIRUS, NAA (HOSP ORDER, SEND-OUT TO REF LAB; TAT 18-24 HRS): SARS-CoV-2, NAA: NOT DETECTED

## 2018-09-30 LAB — QUANTIFERON-TB GOLD PLUS (RQFGPL)
QuantiFERON Mitogen Value: >10 [IU]/mL
QuantiFERON Nil Value: 0.17 [IU]/mL
QuantiFERON TB1 Ag Value: 0.18 [IU]/mL
QuantiFERON TB2 Ag Value: 0.18 [IU]/mL

## 2018-09-30 LAB — QUANTIFERON-TB GOLD PLUS: QuantiFERON-TB Gold Plus: NEGATIVE

## 2018-10-01 ENCOUNTER — Telehealth: Payer: Self-pay | Admitting: Pulmonary Disease

## 2018-10-01 NOTE — Telephone Encounter (Signed)
Called patient for COVID-19 pre-screening for in office visit. ° °Have you recently traveled any where out of the local area in the last 2 weeks? No ° °Have you been in close contact with a person diagnosed with COVID-19 or someone awaiting results within the last 2 weeks? No ° °Do you currently have any of the following symptoms? If so, when did they start? °Cough     Diarrhea   Joint Pain °Fever      Muscle Pain   Red eyes °Shortness of breath   Abdominal pain  Vomiting °Loss of smell    Rash    Sore Throat °Headache    Weakness   Bruising or bleeding ° ° °Okay to proceed with visit 10/02/2018  ° ° °

## 2018-10-02 ENCOUNTER — Other Ambulatory Visit
Admission: RE | Admit: 2018-10-02 | Discharge: 2018-10-02 | Disposition: A | Discharge status: Home / Self Care | Payer: Medicaid Other | Source: Ambulatory Visit | Attending: Pulmonary Disease | Admitting: Pulmonary Disease

## 2018-10-02 ENCOUNTER — Encounter: Payer: Self-pay | Admitting: Pulmonary Disease

## 2018-10-02 ENCOUNTER — Telehealth: Payer: Self-pay | Admitting: Pulmonary Disease

## 2018-10-02 ENCOUNTER — Other Ambulatory Visit: Payer: Self-pay

## 2018-10-02 ENCOUNTER — Ambulatory Visit (INDEPENDENT_AMBULATORY_CARE_PROVIDER_SITE_OTHER): Payer: Medicaid Other | Admitting: Pulmonary Disease

## 2018-10-02 VITALS — BP 138/90 | HR 92 | Temp 98.0°F | Ht 73.0 in | Wt 235.0 lb

## 2018-10-02 DIAGNOSIS — R918 Other nonspecific abnormal finding of lung field: Secondary | ICD-10-CM | POA: Insufficient documentation

## 2018-10-02 DIAGNOSIS — M7918 Myalgia, other site: Secondary | ICD-10-CM | POA: Diagnosis not present

## 2018-10-02 DIAGNOSIS — R21 Rash and other nonspecific skin eruption: Secondary | ICD-10-CM | POA: Diagnosis not present

## 2018-10-02 DIAGNOSIS — I7300 Raynaud's syndrome without gangrene: Secondary | ICD-10-CM

## 2018-10-02 DIAGNOSIS — G8929 Other chronic pain: Secondary | ICD-10-CM

## 2018-10-02 DIAGNOSIS — F17200 Nicotine dependence, unspecified, uncomplicated: Secondary | ICD-10-CM

## 2018-10-02 LAB — SEDIMENTATION RATE: Sed Rate: 27 mm/hr — ABNORMAL HIGH (ref 0–15)

## 2018-10-02 LAB — CBC WITH DIFFERENTIAL/PLATELET
Abs Immature Granulocytes: 0.01 10*3/uL (ref 0.00–0.07)
Basophils Absolute: 0 10*3/uL (ref 0.0–0.1)
Basophils Relative: 1 %
Eosinophils Absolute: 0.2 10*3/uL (ref 0.0–0.5)
Eosinophils Relative: 4 %
HCT: 43.7 % (ref 39.0–52.0)
Hemoglobin: 14.3 g/dL (ref 13.0–17.0)
Immature Granulocytes: 0 %
Lymphocytes Relative: 30 %
Lymphs Abs: 1.4 10*3/uL (ref 0.7–4.0)
MCH: 25.6 pg — ABNORMAL LOW (ref 26.0–34.0)
MCHC: 32.7 g/dL (ref 30.0–36.0)
MCV: 78.3 fL — ABNORMAL LOW (ref 80.0–100.0)
Monocytes Absolute: 0.5 10*3/uL (ref 0.1–1.0)
Monocytes Relative: 11 %
Neutro Abs: 2.6 10*3/uL (ref 1.7–7.7)
Neutrophils Relative %: 54 %
Platelets: 261 10*3/uL (ref 150–400)
RBC: 5.58 MIL/uL (ref 4.22–5.81)
RDW: 13 % (ref 11.5–15.5)
WBC: 4.7 10*3/uL (ref 4.0–10.5)
nRBC: 0 % (ref 0.0–0.2)

## 2018-10-02 NOTE — Telephone Encounter (Addendum)
EBUS and bronch with nav scheduled for 10/08/2018 with DR. Patsey Berthold. CPT: [phone]. LI:DCVUDT and mediastinal adenopathy.  Will contact pt to confirm appts once confirmation is received.

## 2018-10-02 NOTE — Progress Notes (Addendum)
PULMONARY CONSULT NOTE  Requesting MD/Service: Honor Loh, NP - Urgent care center Date of initial consultation:  Reason for consultation:   PT PROFILE: 38 y.o. male smoker (up to 1 PPD since age 42) referred for respiratory symptoms since January 2020 with persistent abnormalities on CT chest  DATA: 03/27/18 CT chest: Limited evaluation for pulmonary emboli secondary to poor contrast opacification of pulmonary arterial system. No gross acute central embolus seen. Nodular areas of consolidation in the left upper lobe. Mediastinal and hilar adenopathy, which may be secondary to reactive inflammation, infection, or lymphoproliferative disease 09/27/18 CT chest: Unusual findings of progressive nodularity throughout the LUL with associated mediastinal and bilateral hilar lymphadenopathy. These findings are very unusual, but are presumably of infectious etiology in this young patient. Neoplasm is not excluded, but not strongly favored  INTERVAL:  HPI:  This is a very pleasant 38 year old gentleman referred from urgent care center with the following history.  His current history actually dates back to January of this year when he presented to the emergency department with sharp (knifelike) midthoracic chest pain radiating to his back.  At that time, he had no fever or significant sputum production.  He underwent CT angiogram (results documented above) and was treated with Augmentin and azithromycin.  He had some improvement in his symptoms for approximately 1 month.  However, gradually his symptoms have progressively worsened.  In particular, he continues to have left-sided chest discomfort which he continues to describe as "sharp".  It comes and goes and is exacerbated by deep inspiration.  He presented to urgent care center 09/27/2018 and underwent a another CT scan of the chest (this time without IV contrast) with findings documented above.  He denies fever and unexplained weight loss.  He denies  significant shortness of breath.  In retrospect, he thinks he had "something similar" in January 2019 for which he was treated with antibiotics and symptoms resolved completely.  When he was seen in the urgent care center, I was contacted to review the CT scan with the care provider there.  I suggested that a QuantiFERON gold be checked which was done and is negative.  SARS-CoV-2 PCR was also negative on that day.  Review of systems elicits a history suggestive of Raynaud's phenomenon.  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  He also has a tendency for very cold hands.  He has multiple musculoskeletal/joint complaints.  In particular, he has chronic R shoulder pain without clear history of injury.  His smoking history as documented above.  At some point, he did "vape" for approximately 5 years but presently is smoking approximately 1 PPD.  He works at [employer] without significant exposures.  He has no pets in the home.  He has no unusual hobbies or other respiratory exposures.  He has no significant travel history.  He was never in the [employer].  He has no history of tuberculosis exposure.  He does occasionally get short of breath when supine but he attributes this to chest discomfort.  He is noted to be a heavy snorer but denies significant daytime hypersomnolence.  He has never awakened from sleep with breathlessness.  He has no significant lower extremity edema or calf tenderness.  He does have chronic intermittent heartburn symptoms.  He also notes a "rash" which has been present for many years across his torso.  He is treated for hypertension.  Otherwise he has no significant ongoing medical diagnoses.  Past  Medical History:  Diagnosis Date  . Arthritis   . Depression    NO MEDS  . GERD (gastroesophageal reflux disease)    NO MEDS  . Hypertension   . Pneumonia    JUNE 2020    Past Surgical History:  Procedure Laterality Date  . NO  PAST SURGERIES      MEDICATIONS: I have reviewed all medications and confirmed regimen as documented  Social History   Socioeconomic History  . Marital status: Married    Spouse name: Not on file  . Number of children: Not on file  . Years of education: Not on file  . Highest education level: Not on file  Occupational History  . Not on file  Social Needs  . Financial resource strain: Not on file  . Food insecurity    Worry: Not on file    Inability: Not on file  . Transportation needs    Medical: Not on file    Non-medical: Not on file  Tobacco Use  . Smoking status: Current Every Day Smoker    Packs/day: 1.00    Years: 15.00    Pack years: 15.00    Types: Cigarettes  . Smokeless tobacco: Never Used  Substance and Sexual Activity  . Alcohol use: Yes    Comment: socially  . Drug use: No  . Sexual activity: Not on file  Lifestyle  . Physical activity    Days per week: Not on file    Minutes per session: Not on file  . Stress: Not on file  Relationships  . Social Herbalist on phone: Not on file    Gets together: Not on file    Attends religious service: Not on file    Active member of club or organization: Not on file    Attends meetings of clubs or organizations: Not on file    Relationship status: Not on file  . Intimate partner violence    Fear of current or ex partner: Not on file    Emotionally abused: Not on file    Physically abused: Not on file    Forced sexual activity: Not on file  Other Topics Concern  . Not on file  Social History Narrative  . Not on file    Family History  Problem Relation Age of Onset  . Hypertension Mother   . Diabetes Mother   . Diabetes Father   . Hypertension Father     ROS: No fever, unexplained weight loss or weight gain No new focal weakness or sensory deficits No otalgia, hearing loss, visual changes, nasal and sinus symptoms, mouth and throat problems No neck pain or adenopathy No abdominal pain,  N/V/D, diarrhea, change in bowel pattern No dysuria, change in urinary pattern   Vitals:   10/02/18 1110  BP: 138/90  Pulse: 92  Temp: 98 F (36.7 C)  TempSrc: Temporal  SpO2: 98%  Weight: 235 lb (106.6 kg)  Height: '6\' 1"'$  (1.854 m)     EXAM:  Gen: WDWN, No overt respiratory distress HEENT: NCAT, sclera white, oropharynx normal Neck: Supple without LAN, thyromegaly, JVD Lungs: breath sounds full, percussion normal, adventitious sounds: None Cardiovascular: RRR, no murmurs noted Abdomen: Soft, nontender, normal BS Ext: without clubbing, cyanosis, edema.  No arthritic changes Neuro: CNs grossly intact, motor and sensory intact Skin: Areas of hyperpigmentation on lower thorax and upper abdomen  DATA:   BMP Latest Ref Rng & Units 03/27/2018 12/21/2017 09/04/2017  Glucose 70 - 99  mg/dL 93 107(H) 109(H)  BUN 6 - 20 mg/dL '14 13 13  '$ Creatinine 0.61 - 1.24 mg/dL 0.93 0.94 1.16  Sodium 135 - 145 mmol/L 139 139 140  Potassium 3.5 - 5.1 mmol/L 3.5 3.7 4.0  Chloride 98 - 111 mmol/L 105 104 103  CO2 22 - 32 mmol/L '28 29 27  '$ Calcium 8.9 - 10.3 mg/dL 8.6(L) 9.0 9.0    CBC Latest Ref Rng & Units 10/02/2018 03/27/2018 12/21/2017  WBC 4.0 - 10.5 K/uL 4.7 7.8 6.1  Hemoglobin 13.0 - 17.0 g/dL 14.3 13.4 14.7  Hematocrit 39.0 - 52.0 % 43.7 41.8 43.8  Platelets 150 - 400 K/uL 261 303 286    CXR:  LUL reticulonodular infiltrate. Bilateral hilar lymphadenopathy.  I have personally reviewed all chest radiographs reported above including CXRs and CT chest unless otherwise indicated  IMPRESSION:     ICD-10-CM   1.   Multiple (innumerable) LUL pulmonary nodules  R91.8 IgG, IgA, IgM    Anti-scleroderma antibody  2. Hilar and mediastinal LAN  CBC with Differential/Platelet    Rheumatoid factor    ANA    Sedimentation rate  3. Two mass-like lesions in LUL  R91.8   3. Smoker  F17.200   4. Chronic musculoskeletal pain/arthralgias  M79.18   5. Suspected Raynaud's phenomena  I73.00   6. Trunkal  rash  R21    He has a pulmonary process with very unusual radiographic findings.  I am doubtful that this represents a malignant process.  It is chronic and gradually progressive (present since January 2020).  It appears inflammatory, either infectious or noninfectious.  Infectious causes would include all of the atypical granulomatous infections.  Tuberculosis appears to be effectively ruled out with a negative QuantiFERON gold.  Noninfectious causes could include pulmonary Langerhans cell histiocytosis X or RB-ILD.  The presence of some of the other symptoms above (rash, possible Raynaud's phenomenon, arthralgias) suggest the possibility of a connective tissue disorder with pulmonary involvement  PLAN:  Blood tests ordered this encounter: CBC with differential, ESR, ANA, rheumatoid factor, anti-scleroderma antibodies, quantitative immunoglobulins  We discussed diagnostic bronchoscopy.  I offered that I can perform conventional bronchoscopy with LUL transbronchial biopsies.  I suggested that ENB plus EBUS might have a higher yield and he preferred to go with this so that we do not need to come back to do a second procedure.  I introduced him to Dr. Patsey Berthold and these procedures are scheduled for next week.  I counseled him regarding the importance of smoking cessation and the possible relationship of smoking to his current pulmonary problems.  Follow-up with me in 2 weeks.  Call sooner if needed  Merton Border, MD PCCM service Mobile [phone] Pager [phone] 10/04/2018 12:29 PM

## 2018-10-02 NOTE — Telephone Encounter (Signed)
Telephone preadmit visit scheduled for for 10/03/2018 at 9:00 with covid test on 10/04/2018. Pt has been made aware of this information and voiced his understanding.  Nothing further is needed at this time.

## 2018-10-02 NOTE — Patient Instructions (Signed)
Bronchoscopy with navigation and ultrasound guidance to biopsy the left upper lobe and lymph nodes. This is to be scheduled with Dr Patsey Berthold.  Follow up with me in 2 weeks to review bronchoscopy results and blood tests and determine further evaluation and treatment  Please work very hard on smoking cessation. This is very important!!

## 2018-10-03 ENCOUNTER — Other Ambulatory Visit: Payer: Self-pay

## 2018-10-03 ENCOUNTER — Encounter
Admission: RE | Admit: 2018-10-03 | Discharge: 2018-10-03 | Disposition: A | Discharge status: Home / Self Care | Payer: Medicaid Other | Source: Ambulatory Visit | Attending: Pulmonary Disease | Admitting: Pulmonary Disease

## 2018-10-03 HISTORY — DX: Pneumonia, unspecified organism: J18.9

## 2018-10-03 HISTORY — DX: Gastro-esophageal reflux disease without esophagitis: K21.9

## 2018-10-03 HISTORY — DX: Unspecified osteoarthritis, unspecified site: M19.90

## 2018-10-03 HISTORY — DX: Depression, unspecified: F32.A

## 2018-10-03 LAB — ANTI-SCLERODERMA ANTIBODY: Scleroderma (Scl-70) (ENA) Antibody, IgG: <0.2 AI (ref 0.0–0.9)

## 2018-10-03 LAB — RHEUMATOID FACTOR: Rheumatoid fact SerPl-aCnc: <10 IU/mL (ref 0.0–13.9)

## 2018-10-03 LAB — ANA: Anti Nuclear Antibody (ANA): NEGATIVE

## 2018-10-03 NOTE — Patient Instructions (Signed)
Your procedure is scheduled on: 10-08-18 MONDAY Report to Same Day Surgery 2nd floor medical mall [facility] Entrance-take elevator on left to 2nd floor.  Check in with surgery information desk.) To find out your arrival time please call [phone] between 1PM - 3PM on 10-05-18 FRIDAY   Remember: Instructions that are not followed completely may result in serious medical risk, up to and including death, or upon the discretion of your surgeon and anesthesiologist your surgery may need to be rescheduled.    _x___ 1. Do not eat food after midnight the night before your procedure. NO GUM OR CANDY AFTER MIDNIGHT. You may drink clear liquids up to 2 hours before you are scheduled to arrive at the hospital for your procedure.  Do not drink clear liquids within 2 hours of your scheduled arrival to the hospital.  Clear liquids include  --Water or Apple juice without pulp  --Clear carbohydrate beverage such as ClearFast or Gatorade  --Black Coffee or Clear Tea (No milk, no creamers, do not add anything to the coffee or Tea   ____Ensure clear carbohydrate drink on the way to the hospital for bariatric patients  ____Ensure clear carbohydrate drink 3 hours before surgery.    __x__ 2. No Alcohol for 24 hours before or after surgery.   __x__3. No Smoking or e-cigarettes for 24 prior to surgery.  Do not use any chewable tobacco products for at least 6 hour prior to surgery   ____  4. Bring all medications with you on the day of surgery if instructed.    __x__ 5. Notify your doctor if there is any change in your medical condition     (cold, fever, infections).    x___6. On the morning of surgery brush your teeth with toothpaste and water.  You may rinse your mouth with mouth wash if you wish.  Do not swallow any toothpaste or mouthwash.   Do not wear jewelry, make-up, hairpins, clips or nail polish.  Do not wear lotions, powders, or perfumes. You may wear deodorant.  Do not shave 48 hours prior  to surgery. Men may shave face and neck.  Do not bring valuables to the hospital.    [facility] is not responsible for any belongings or valuables.               Contacts, dentures or bridgework may not be worn into surgery.  Leave your suitcase in the car. After surgery it may be brought to your room.  For patients admitted to the hospital, discharge time is determined by your treatment team.  _  Patients discharged the day of surgery will not be allowed to drive home.  You will need someone to drive you home and stay with you the night of your procedure.    Please read over the following fact sheets that you were given:   [facility] Preparing for Surgery   _x___ TAKE THE FOLLOWING MEDICATION THE MORNING OF SURGERY WITH A SMALL SIP OF WATER. These include:  1. AMLODIPINE (NORVASC)  2.  3.  4.  5.  6.  ____Fleets enema or Magnesium Citrate as directed.   ____ Use CHG Soap or sage wipes as directed on instruction sheet   ____ Use inhalers on the day of surgery and bring to hospital day of surgery  ____ Stop Metformin and Janumet 2 days prior to surgery.    ____ Take 1/2 of usual insulin dose the night before surgery and none on the morning surgery.  ____ Follow recommendations from Cardiologist, Pulmonologist or PCP regarding stopping Aspirin, Coumadin, Plavix ,Eliquis, Effient, or Pradaxa, and Pletal.  __X__Stop Anti-inflammatories such as Advil, Aleve, Ibuprofen, Motrin, Naproxen, Naprosyn, Goodies powders or aspirin products NOW-OK to take Tylenol    ____ Stop supplements until after surgery.    ____ Bring C-Pap to the hospital.

## 2018-10-03 NOTE — Progress Notes (Addendum)
Patient ID: Luis Mcknight, male   DOB: [birth year], 37 y.o.   MRN: [id number] The patient was evaluated by Dr. Merton Border on 02 October 2018.  Dr. Jamal Collin determined that the patient would require bronchoscopy with navigational assistance and endobronchial ultrasound for sampling of the abnormalities noted on CT scan of the chest.  These abnormalities show progressive nodularity of the LEFT upper lobe with 2 dominant nodules/masses noted.  In addition the patient has extensive mediastinal adenopathy particularly subcarinal (central no specific laterality) and hilar areas.  Dr. Alva Garnet introduced me to the patient and the procedures were discussed with the patient.  Benefits, limitations and potential complications of the procedure were discussed with the patient including, but not limited to bleeding, hemoptysis, respiratory failure requiring intubation and/or prolongued mechanical ventilation, infection, pneumothorax (collapse of lung) requiring chest tube placement, or even death.  The patient understands that the risks of potential pneumothorax are in the 1 to 3% range with navigational bronchoscopy.  The patient agrees to proceed with navigational bronchoscopy and endobronchial ultrasound.  This is been scheduled for the end of August.  A navigational plan has been created for the patient.   The patient was allowed to ask questions with regards to the procedure and these were answered to his satisfaction.  Renold Don, MD [place] [facility] Advanced Bronchoscopy

## 2018-10-03 NOTE — H&P (View-Only) (Signed)
Patient ID: Luis Mcknight, male   DOB: May 02, 1980, 38 y.o.   MRN: [id number] The patient was evaluated by Dr. Merton Border on 02 October 2018.  Dr. Jamal Collin determined that the patient would require bronchoscopy with navigational assistance and endobronchial ultrasound for sampling of the abnormalities noted on CT scan of the chest.  These abnormalities show progressive nodularity of the LEFT upper lobe with 2 dominant nodules/masses noted.  In addition the patient has extensive mediastinal adenopathy particularly subcarinal (central no specific laterality) and hilar areas.  Dr. Alva Garnet introduced me to the patient and the procedures were discussed with the patient.  Benefits, limitations and potential complications of the procedure were discussed with the patient including, but not limited to bleeding, hemoptysis, respiratory failure requiring intubation and/or prolongued mechanical ventilation, infection, pneumothorax (collapse of lung) requiring chest tube placement, or even death.  The patient understands that the risks of potential pneumothorax are in the 1 to 3% range with navigational bronchoscopy.  The patient agrees to proceed with navigational bronchoscopy and endobronchial ultrasound.  This is been scheduled for the end of August.  A navigational plan has been created for the patient.   The patient was allowed to ask questions with regards to the procedure and these were answered to his satisfaction.  Renold Don, MD [place] [facility] Advanced Bronchoscopy

## 2018-10-04 ENCOUNTER — Encounter: Payer: Self-pay | Admitting: Pulmonary Disease

## 2018-10-04 ENCOUNTER — Other Ambulatory Visit
Admission: RE | Admit: 2018-10-04 | Discharge: 2018-10-04 | Disposition: A | Discharge status: Home / Self Care | Payer: Medicaid Other | Source: Ambulatory Visit | Attending: Pulmonary Disease | Admitting: Pulmonary Disease

## 2018-10-04 ENCOUNTER — Other Ambulatory Visit: Payer: Self-pay

## 2018-10-04 DIAGNOSIS — Z20828 Contact with and (suspected) exposure to other viral communicable diseases: Secondary | ICD-10-CM | POA: Diagnosis not present

## 2018-10-04 LAB — SARS CORONAVIRUS 2 (TAT 6-24 HRS): SARS Coronavirus 2: NEGATIVE

## 2018-10-04 NOTE — Telephone Encounter (Signed)
No PA required for [id number], [id number], [phone] per [employer] web site. Rhonda J Cobb

## 2018-10-08 ENCOUNTER — Ambulatory Visit
Admission: RE | Admit: 2018-10-08 | Discharge: 2018-10-08 | Disposition: A | Discharge status: Home / Self Care | Payer: Medicaid Other | Attending: Pulmonary Disease | Admitting: Pulmonary Disease

## 2018-10-08 ENCOUNTER — Encounter
Admission: RE | Disposition: A | Discharge status: Home / Self Care | Payer: Self-pay | Source: Home / Self Care | Attending: Pulmonary Disease

## 2018-10-08 ENCOUNTER — Telehealth: Payer: Self-pay | Admitting: Pulmonary Disease

## 2018-10-08 ENCOUNTER — Ambulatory Visit: Payer: Medicaid Other

## 2018-10-08 ENCOUNTER — Other Ambulatory Visit: Payer: Self-pay

## 2018-10-08 ENCOUNTER — Ambulatory Visit: Payer: Medicaid Other | Admitting: Anesthesiology

## 2018-10-08 DIAGNOSIS — R590 Localized enlarged lymph nodes: Secondary | ICD-10-CM | POA: Diagnosis not present

## 2018-10-08 DIAGNOSIS — R918 Other nonspecific abnormal finding of lung field: Secondary | ICD-10-CM

## 2018-10-08 DIAGNOSIS — F17200 Nicotine dependence, unspecified, uncomplicated: Secondary | ICD-10-CM | POA: Insufficient documentation

## 2018-10-08 DIAGNOSIS — J984 Other disorders of lung: Secondary | ICD-10-CM | POA: Insufficient documentation

## 2018-10-08 DIAGNOSIS — R591 Generalized enlarged lymph nodes: Secondary | ICD-10-CM

## 2018-10-08 DIAGNOSIS — I10 Essential (primary) hypertension: Secondary | ICD-10-CM | POA: Insufficient documentation

## 2018-10-08 DIAGNOSIS — R599 Enlarged lymph nodes, unspecified: Secondary | ICD-10-CM

## 2018-10-08 DIAGNOSIS — R911 Solitary pulmonary nodule: Secondary | ICD-10-CM | POA: Diagnosis present

## 2018-10-08 HISTORY — PX: ENDOBRONCHIAL ULTRASOUND: SHX5096

## 2018-10-08 HISTORY — PX: ELECTROMAGNETIC NAVIGATION BROCHOSCOPY: SHX5369

## 2018-10-08 SURGERY — ENDOBRONCHIAL ULTRASOUND (EBUS)
Anesthesia: General | Laterality: Left

## 2018-10-08 MED ORDER — MEPERIDINE HCL 50 MG/ML IJ SOLN: 6.2500 mg | INTRAMUSCULAR | Status: DC | PRN

## 2018-10-08 MED ORDER — LIDOCAINE HCL (CARDIAC) PF 100 MG/5ML IV SOSY
PREFILLED_SYRINGE | INTRAVENOUS | Status: DC | PRN
  Administered 2018-10-08: 60 mg via INTRAVENOUS

## 2018-10-08 MED ORDER — PHENYLEPHRINE HCL (PRESSORS) 10 MG/ML IV SOLN
INTRAVENOUS | Status: DC | PRN
  Administered 2018-10-08 (×3): 100 ug via INTRAVENOUS

## 2018-10-08 MED ORDER — BUTAMBEN-TETRACAINE-BENZOCAINE 2-2-14 % EX AERO
1.0000 | INHALATION_SPRAY | Freq: Once | CUTANEOUS | Status: DC
  Filled 2018-10-08: qty 20

## 2018-10-08 MED ORDER — ROCURONIUM BROMIDE 50 MG/5ML IV SOLN
INTRAVENOUS | Status: AC
  Filled 2018-10-08: qty 1

## 2018-10-08 MED ORDER — PROMETHAZINE HCL 25 MG/ML IJ SOLN: 6.2500 mg | INTRAMUSCULAR | Status: DC | PRN

## 2018-10-08 MED ORDER — DEXAMETHASONE SODIUM PHOSPHATE 10 MG/ML IJ SOLN
INTRAMUSCULAR | Status: DC | PRN
  Administered 2018-10-08: 4 mg via INTRAVENOUS

## 2018-10-08 MED ORDER — GLYCOPYRROLATE 0.2 MG/ML IJ SOLN
INTRAMUSCULAR | Status: AC
  Filled 2018-10-08: qty 1

## 2018-10-08 MED ORDER — SUGAMMADEX SODIUM 500 MG/5ML IV SOLN
INTRAVENOUS | Status: AC
  Filled 2018-10-08: qty 5

## 2018-10-08 MED ORDER — MIDAZOLAM HCL 5 MG/5ML IJ SOLN
INTRAMUSCULAR | Status: DC | PRN
  Administered 2018-10-08: 2 mg via INTRAVENOUS

## 2018-10-08 MED ORDER — FAMOTIDINE 20 MG PO TABS
20.0000 mg | ORAL_TABLET | Freq: Once | ORAL | Status: AC
  Administered 2018-10-08: 20 mg via ORAL

## 2018-10-08 MED ORDER — SUGAMMADEX SODIUM 200 MG/2ML IV SOLN
INTRAVENOUS | Status: DC | PRN
  Administered 2018-10-08: 220 mg via INTRAVENOUS

## 2018-10-08 MED ORDER — DEXAMETHASONE SODIUM PHOSPHATE 10 MG/ML IJ SOLN
INTRAMUSCULAR | Status: AC
  Filled 2018-10-08: qty 1

## 2018-10-08 MED ORDER — PROPOFOL 10 MG/ML IV BOLUS
INTRAVENOUS | Status: DC | PRN
  Administered 2018-10-08: 200 mg via INTRAVENOUS

## 2018-10-08 MED ORDER — ONDANSETRON HCL 4 MG/2ML IJ SOLN
INTRAMUSCULAR | Status: DC | PRN
  Administered 2018-10-08: 4 mg via INTRAVENOUS

## 2018-10-08 MED ORDER — LIDOCAINE HCL (PF) 2 % IJ SOLN
INTRAMUSCULAR | Status: AC
  Filled 2018-10-08: qty 10

## 2018-10-08 MED ORDER — SUCCINYLCHOLINE CHLORIDE 20 MG/ML IJ SOLN
INTRAMUSCULAR | Status: DC | PRN
  Administered 2018-10-08: 180 mg via INTRAVENOUS

## 2018-10-08 MED ORDER — MIDAZOLAM HCL 2 MG/2ML IJ SOLN
INTRAMUSCULAR | Status: AC
  Filled 2018-10-08: qty 2

## 2018-10-08 MED ORDER — ROCURONIUM BROMIDE 100 MG/10ML IV SOLN
INTRAVENOUS | Status: DC | PRN
  Administered 2018-10-08: 10 mg via INTRAVENOUS
  Administered 2018-10-08: 5 mg via INTRAVENOUS
  Administered 2018-10-08: 25 mg via INTRAVENOUS

## 2018-10-08 MED ORDER — SODIUM CHLORIDE 0.9 % IV SOLN: Freq: Once | INTRAVENOUS | Status: DC

## 2018-10-08 MED ORDER — PROPOFOL 10 MG/ML IV BOLUS
INTRAVENOUS | Status: AC
  Filled 2018-10-08: qty 20

## 2018-10-08 MED ORDER — FENTANYL CITRATE (PF) 100 MCG/2ML IJ SOLN
INTRAMUSCULAR | Status: AC
  Filled 2018-10-08: qty 2

## 2018-10-08 MED ORDER — SUCCINYLCHOLINE CHLORIDE 20 MG/ML IJ SOLN
INTRAMUSCULAR | Status: AC
  Filled 2018-10-08: qty 1

## 2018-10-08 MED ORDER — GLYCOPYRROLATE 0.2 MG/ML IJ SOLN
INTRAMUSCULAR | Status: DC | PRN
  Administered 2018-10-08: 0.2 mg via INTRAVENOUS

## 2018-10-08 MED ORDER — ONDANSETRON HCL 4 MG/2ML IJ SOLN
INTRAMUSCULAR | Status: AC
  Filled 2018-10-08: qty 2

## 2018-10-08 MED ORDER — DEXMEDETOMIDINE HCL IN NACL 200 MCG/50ML IV SOLN
INTRAVENOUS | Status: AC
  Filled 2018-10-08: qty 50

## 2018-10-08 MED ORDER — FENTANYL CITRATE (PF) 100 MCG/2ML IJ SOLN
INTRAMUSCULAR | Status: DC | PRN
  Administered 2018-10-08 (×2): 50 ug via INTRAVENOUS

## 2018-10-08 MED ORDER — DEXMEDETOMIDINE HCL IN NACL 200 MCG/50ML IV SOLN
INTRAVENOUS | Status: DC | PRN
  Administered 2018-10-08: 4 ug via INTRAVENOUS
  Administered 2018-10-08: 12 ug via INTRAVENOUS
  Administered 2018-10-08: 4 ug via INTRAVENOUS

## 2018-10-08 MED ORDER — LACTATED RINGERS IV SOLN
INTRAVENOUS | Status: DC
  Administered 2018-10-08: 12:00:00 via INTRAVENOUS

## 2018-10-08 MED ORDER — FENTANYL CITRATE (PF) 100 MCG/2ML IJ SOLN: 25.0000 ug | INTRAMUSCULAR | Status: DC | PRN

## 2018-10-08 MED ORDER — FAMOTIDINE 20 MG PO TABS
ORAL_TABLET | ORAL | Status: AC
  Administered 2018-10-08: 12:00:00 20 mg via ORAL
  Filled 2018-10-08: qty 1

## 2018-10-08 NOTE — Anesthesia Post-op Follow-up Note (Signed)
Anesthesia QCDR form completed.        

## 2018-10-08 NOTE — Telephone Encounter (Signed)
Dr. Mortimer Fries will be performing bronch for Dr. Patsey Berthold, due to family emergency. Both pt, and Jamie, RN with OR have been made aware of this change.  Nothing further is needed at this time.

## 2018-10-08 NOTE — Anesthesia Preprocedure Evaluation (Signed)
Anesthesia Evaluation  Patient identified by MRN, date of birth, ID band Patient awake    Reviewed: Allergy & Precautions, NPO status , Patient's Chart, lab work & pertinent test results  History of Anesthesia Complications Negative for: history of anesthetic complications  Airway Mallampati: II  TM Distance: >3 FB Neck ROM: Full    Dental no notable dental hx.    Pulmonary neg sleep apnea, neg COPD, Current Smoker,    breath sounds clear to auscultation- rhonchi (-) wheezing      Cardiovascular hypertension, Pt. on medications (-) CAD, (-) Past MI, (-) Cardiac Stents and (-) CABG  Rhythm:Regular Rate:Normal - Systolic murmurs and - Diastolic murmurs    Neuro/Psych neg Seizures PSYCHIATRIC DISORDERS Depression negative neurological ROS     GI/Hepatic Neg liver ROS, GERD  ,  Endo/Other  negative endocrine ROSneg diabetes  Renal/GU negative Renal ROS     Musculoskeletal  (+) Arthritis ,   Abdominal (+) + obese,   Peds  Hematology negative hematology ROS (+)   Anesthesia Other Findings Past Medical History: No date: Arthritis No date: Depression     Comment:  NO MEDS No date: GERD (gastroesophageal reflux disease)     Comment:  NO MEDS No date: Hypertension No date: Pneumonia     Comment:  JUNE 2020   Reproductive/Obstetrics                             Anesthesia Physical Anesthesia Plan  ASA: II  Anesthesia Plan: General   Post-op Pain Management:    Induction: Intravenous  PONV Risk Score and Plan: 0 and Ondansetron and Midazolam  Airway Management Planned: Oral ETT  Additional Equipment:   Intra-op Plan:   Post-operative Plan: Extubation in OR  Informed Consent: I have reviewed the patients History and Physical, chart, labs and discussed the procedure including the risks, benefits and alternatives for the proposed anesthesia with the patient or authorized  representative who has indicated his/her understanding and acceptance.     Dental advisory given  Plan Discussed with: CRNA and Anesthesiologist  Anesthesia Plan Comments:         Anesthesia Quick Evaluation

## 2018-10-08 NOTE — OR Nursing (Signed)
Dr Mortimer Fries reviewed consent that was signed by patient, no changes made consent signed by md

## 2018-10-08 NOTE — Anesthesia Procedure Notes (Signed)
Procedure Name: Intubation Date/Time: 10/08/2018 1:16 PM Performed by: Dionne Bucy, CRNA Pre-anesthesia Checklist: Patient identified, Patient being monitored, Timeout performed, Emergency Drugs available and Suction available Patient Re-evaluated:Patient Re-evaluated prior to induction Oxygen Delivery Method: Circle system utilized Preoxygenation: Pre-oxygenation with 100% oxygen Induction Type: IV induction Ventilation: Mask ventilation without difficulty Laryngoscope Size: McGraph and 4 Grade View: Grade I Tube type: Oral Tube size: 8.5 mm Number of attempts: 1 Airway Equipment and Method: Stylet and Video-laryngoscopy Placement Confirmation: ETT inserted through vocal cords under direct vision,  positive ETCO2 and breath sounds checked- equal and bilateral Secured at: 22 cm Tube secured with: Tape Dental Injury: Teeth and Oropharynx as per pre-operative assessment

## 2018-10-08 NOTE — Transfer of Care (Signed)
Immediate Anesthesia Transfer of Care Note  Patient: Luis Mcknight  Procedure(s) Performed: ENDOBRONCHIAL ULTRASOUND, LEFT (Left ) ELECTROMAGNETIC NAVIGATION BRONCHOSCOPY, LEFT (Left )  Patient Location: PACU  Anesthesia Type:General  Level of Consciousness: sedated  Airway & Oxygen Therapy: Patient Spontanous Breathing and Patient connected to face mask oxygen  Post-op Assessment: Report given to RN and Post -op Vital signs reviewed and stable  Post vital signs: Reviewed and stable  Last Vitals:  Vitals Value Taken Time  BP 117/75 10/08/18 1436  Temp 36.3 C 10/08/18 1437  Pulse 91 10/08/18 1439  Resp 25 10/08/18 1439  SpO2 98 % 10/08/18 1439  Vitals shown include unvalidated device data.  Last Pain:  Vitals:   10/08/18 1437  TempSrc:   PainSc: 0-No pain         Complications: No apparent anesthesia complications

## 2018-10-08 NOTE — Interval H&P Note (Signed)
History and Physical Interval Note:  10/08/2018 12:57 PM  Luis Mcknight  has presented today for surgery, with the diagnosis of NODULE / MEDIASTINAL ADENOPATHY.  The various methods of treatment have been discussed with the patient and family. After consideration of risks, benefits and other options for treatment, the patient has consented to  Procedure(s): ENDOBRONCHIAL ULTRASOUND, LEFT (Left) ELECTROMAGNETIC NAVIGATION BRONCHOSCOPY, LEFT (Left) as a surgical intervention.  The patient's history has been reviewed, patient examined, no change in status, stable for surgery.  I have reviewed the patient's chart and labs.  Questions were answered to the patient's satisfaction.     Flora Lipps

## 2018-10-08 NOTE — Discharge Instructions (Signed)
Flexible Bronchoscopy, Care After This sheet gives you information about how to care for yourself after your test. Your doctor may also give you more specific instructions. If you have problems or questions, contact your doctor. Follow these instructions at home: Eating and drinking  Do not eat or drink anything (not even water) for 2 hours after your test, or until your numbing medicine (local anesthetic) wears off.  When your numbness is gone and your cough and gag reflexes have come back, you may: ? Eat only soft foods. ? Slowly drink liquids.  The day after the test, go back to your normal diet. Driving  Do not drive for 24 hours if you were given a medicine to help you relax (sedative).  Do not drive or use heavy machinery while taking prescription pain medicine. General instructions   Take over-the-counter and prescription medicines only as told by your doctor.  Return to your normal activities as told. Ask what activities are safe for you.  Do not use any products that have nicotine or tobacco in them. This includes cigarettes and e-cigarettes. If you need help quitting, ask your doctor.  Keep all follow-up visits as told by your doctor. This is important. It is very important if you had a tissue sample (biopsy) taken. Get help right away if:  You have shortness of breath that gets worse.  You get light-headed.  You feel like you are going to pass out (faint).  You have chest pain.  You cough up: ? More than a little blood. ? More blood than before. Summary  Do not eat or drink anything (not even water) for 2 hours after your test, or until your numbing medicine wears off.  Do not use cigarettes. Do not use e-cigarettes.  Get help right away if you have chest pain. This information is not intended to replace advice given to you by your health care provider. Make sure you discuss any questions you have with your health care provider. Document Released: 12/19/2008  Document Revised: 02/03/2017 Document Reviewed: 03/11/2016 Elsevier Patient Education  2020 [employer].

## 2018-10-08 NOTE — Procedures (Signed)
Electromagnetic Navigation Bronchoscopy: Indication: lung nodule  Preoperative Diagnosis:lung nodule Post Procedure Diagnosis:lung nodule Consent: Verbal/Written  The Risks and Benefits of the procedure explained to patient/family prior to start of procedure and I have discussed the risk for acute bleeding, increased chance of infection, increased chance of respiratory failure and cardiac arrest and death.  I have also explained to avoid all types of NSAIDs to decrease chance of bleeding, and to avoid food and drinks the midnight prior to procedure.  The procedure consists of a video camera with a light source to be placed and inserted  into the lungs to  look for abnormal tissue and to obtain tissue samples by using needle and biopsy tools.  The patient understand the risks and benefits and have agreed to proceed with procedure.   Hand washing performed prior to starting the procedure.   Type of Anesthesia: see Anesthesiology records .   Procedure Performed:  Virtual Bronchoscopy with Multi-planar Image analysis, 3-D reconstruction of coronal, sagittal and multi-planar images for the purposes of planning real-time bronchoscopy using the iLogic Electromagnetic Navigation Bronchoscopy System (superDimension).  Description of Procedure: After obtaining informed consent from the patient, the above sedative and anesthetic measures were carried out, flexible fiberoptic bronchoscope was inserted via Endotracheal tube after patient was intubated by CNA/Anesthesiologist.   The virtual camera was then placed into the central portion of the trachea. The trachea itself was inspected.  The main carina, right and left midstem bronchus and all the segmental and subsegmental airways by virtual bronchoscopy were inspected. The camera was directed to standard registration points at the following centers: main carina, right upper lobe bronchus, right lower lobe bronchus, right middle lobe bronchus, left  upper lobe bronchus, and the left lower lobe bronchus. This data was transferred to the i-Logic ENB system for real-time bronchoscopy.   The scope was then navigated to the LUL nodule for tissue sampling There was extensive amount of airway inflammation and mucoid secretions through out  the airways   Specimans Obtained:   Transbronchial Forceps Biopsy times:6 TRANSBRONCHIAL BIOPSY FOR MICRO-5 BAL approx 20 ml retrieved from LUL     Fluoroscopy:  Fluoroscopy was utilized during the course of this procedure to assure that biopsies were taken in a safe manner under fluoroscopic guidance with spot films required.   Complications:None  Estimated Blood Loss: minimal approx 1cc  Monitoring:  The patient was monitored with continuous oximetry and received supplemental nasal cannula oxygen throughout the procedure. In addition, serial blood pressure measurements and continuous electrocardiography showed these physiologic parameters to remain tolerable throughout the procedure.   Assessment and Plan/Additional Comments: Follow up Pathology Reports Follow up [place], M.D.  [facility]  Medical Director [place] Director [facility] Cardio-Pulmonary Department

## 2018-10-08 NOTE — Procedures (Signed)
PROCEDURE: ENDOBRONCHIAL ULTRASOUND   PROCEDURE DATE: 10/08/2018  TIME:  NAME:  Luis Mcknight  DOB:[birth year]  MRN: [id number] LOC:  ARPO/None    HOSP DAY: @LENGTHOFSTAYDAYS @ CODE STATUS:        Indications/Preliminary Diagnosis:Adenopathy  Consent: (Place X beside choice/s below)  The benefits, risks and possible complications of the procedure were        explained to:  _x__ patient  ___ patient's family  ___ other:___________  who verbalized understanding and gave:  ___ verbal  ___ written  _x__ verbal and written  ___ telephone  ___ other:________ consent.      Unable to obtain consent; procedure performed on emergent basis.     Other:       PRESEDATION ASSESSMENT: History and Physical has been performed. Patient meds and allergies have been reviewed. Presedation airway examination has been performed and documented. Baseline vital signs, sedation score, oxygenation status, and cardiac rhythm were reviewed. Patient was deemed to be in satisfactory condition to undergo the procedure.    PREMEDICATIONS: SEE ANESTHESIOLOGY RECORDS   Insertion Route (Place X beside choice below)   Nasal   Oral  x Endotracheal Tube   Tracheostomy   INTRAPROCEDURE MEDICATIONS: SEE ANESTHESIOLOGY RECORDS   PROCEDURE DETAILS: Timeout performed and correct patient, name, & ID confirmed. Following prep per Pulmonary policy, appropriate sedation was administered.  I proceeded with introducing the endobronchial [place] scope and findings, technical procedures, and specimen collection as noted below. At the end of exam the scope was withdrawn without incident. Impression and Plan as noted below.   SPECIMENS (Sites): (Place X beside choice below)  Specimens Description   No Specimens Obtained     Washings    Lavage    Biopsies   x Fine Needle Aspirates 5   Brushings    Sputum    FINDINGS: extensive amount of subcarinal and mediastinal adenopathy  ESTIMATED BLOOD LOSS:  none COMPLICATIONS/RESOLUTION: none       IMPRESSION:POST-PROCEDURE DX: ADENOPATHY    RECOMMENDATION/PLAN:  Follow up Pathology Reports     Corrin Parker, M.D.  [facility]  Medical Director [place] Director [place] Department

## 2018-10-08 NOTE — Anesthesia Postprocedure Evaluation (Signed)
Anesthesia Post Note  Patient: Lawrence Mitch  Procedure(s) Performed: ENDOBRONCHIAL ULTRASOUND, LEFT (Left ) ELECTROMAGNETIC NAVIGATION BRONCHOSCOPY, LEFT (Left )  Patient location during evaluation: PACU Anesthesia Type: General Level of consciousness: awake and alert and oriented Pain management: pain level controlled Vital Signs Assessment: post-procedure vital signs reviewed and stable Respiratory status: spontaneous breathing, nonlabored ventilation and respiratory function stable Cardiovascular status: blood pressure returned to baseline and stable Postop Assessment: no signs of nausea or vomiting Anesthetic complications: no     Last Vitals:  Vitals:   10/08/18 1520 10/08/18 1532  BP: 113/71 118/71  Pulse: 83 81  Resp: 16 16  Temp: (!) 36.1 C   SpO2: 97% 98%    Last Pain:  Vitals:   10/08/18 1532  TempSrc:   PainSc: 0-No pain                 [place] Mielke

## 2018-10-09 LAB — SURGICAL PATHOLOGY

## 2018-10-09 LAB — CYTOLOGY - NON PAP

## 2018-10-10 LAB — ACID FAST SMEAR (AFB, MYCOBACTERIA): Acid Fast Smear: NEGATIVE

## 2018-10-10 NOTE — Progress Notes (Signed)
DX of   NON-NECROTIZING GRANULOMATOUS INFLAMMATION.  - NEGATIVE FOR MALIGNANCY.

## 2018-10-11 LAB — CULTURE, BAL-QUANTITATIVE W GRAM STAIN
Culture: NO GROWTH
Culture: NO GROWTH

## 2018-10-14 LAB — CULTURE, BAL-QUANTITATIVE W GRAM STAIN: Culture: 70000 — AB

## 2018-10-15 ENCOUNTER — Telehealth: Payer: Self-pay | Admitting: Pulmonary Disease

## 2018-10-15 NOTE — Telephone Encounter (Signed)
Received from from disability determination services.  These forms have been faxed to [employer].  Nothing further is needed.

## 2018-10-15 NOTE — Telephone Encounter (Signed)
Called patient for COVID-19 pre-screening for in office visit. ° °Have you recently traveled any where out of the local area in the last 2 weeks? No ° °Have you been in close contact with a person diagnosed with COVID-19 or someone awaiting results within the last 2 weeks? No ° °Do you currently have any of the following symptoms? If so, when did they start? °Cough     Diarrhea   Joint Pain °Fever      Muscle Pain   Red eyes °Shortness of breath   Abdominal pain  Vomiting °Loss of smell    Rash    Sore Throat °Headache    Weakness   Bruising or bleeding ° ° °Okay to proceed with visit 10/16/2018  ° ° °

## 2018-10-16 ENCOUNTER — Encounter: Payer: Self-pay | Admitting: Pulmonary Disease

## 2018-10-16 ENCOUNTER — Ambulatory Visit (INDEPENDENT_AMBULATORY_CARE_PROVIDER_SITE_OTHER): Payer: Medicaid Other | Admitting: Pulmonary Disease

## 2018-10-16 ENCOUNTER — Other Ambulatory Visit: Payer: Self-pay

## 2018-10-16 VITALS — BP 128/80 | HR 96 | Temp 97.4°F | Ht 73.0 in | Wt 233.0 lb

## 2018-10-16 DIAGNOSIS — F17200 Nicotine dependence, unspecified, uncomplicated: Secondary | ICD-10-CM

## 2018-10-16 DIAGNOSIS — D869 Sarcoidosis, unspecified: Secondary | ICD-10-CM | POA: Diagnosis not present

## 2018-10-16 DIAGNOSIS — E507 Other ocular manifestations of vitamin A deficiency: Secondary | ICD-10-CM

## 2018-10-16 MED ORDER — FLUTICASONE PROPIONATE HFA 220 MCG/ACT IN AERO: 2.0000 | INHALATION_SPRAY | Freq: Two times a day (BID) | RESPIRATORY_TRACT | 10 refills | Status: DC

## 2018-10-16 MED ORDER — PREDNISONE 10 MG PO TABS: ORAL_TABLET | ORAL | 5 refills | Status: DC

## 2018-10-16 NOTE — Patient Instructions (Addendum)
In evaluation and treatment of sarcoidosis, the following have been ordered  Blood test today: Basic metabolic panel, liver function panel Referral made to ophthalmology for basic eye examination  New prescriptions: 1) prednisone 10 mg - 40 mg (4 tabs) daily for 5 days, then 30 mg (3 tabs) daily for 5 days, then 20 mg daily for 5 days, then 10 mg daily until seen in follow-up 2) Flovent HFA 220 mcg - 2 actuations (inhalations) twice a day.  Rinse mouth after use (very important)  Follow-up in 4-6 weeks with Dr. Patsey Berthold.  Chest x-ray and lung function tests (PFTs) to be performed prior to that visit (if possible)  Please note, we might not be able to get lung function test prior to the follow-up visit and they will be scheduled for later.

## 2018-10-17 NOTE — Progress Notes (Signed)
PULMONARY OFFICE FOLLOW UP NOTE  Requesting MD/Service: Honor Loh, NP - Urgent care center Date of initial consultation:  Reason for consultation:   PT PROFILE: 38 y.o. male smoker (up to 1 PPD since age 75) referred for respiratory symptoms since January 2020 with persistent abnormalities on CT chest  DATA: 03/27/18 CT chest: Limited evaluation for pulmonary emboli secondary to poor contrast opacification of pulmonary arterial system. No gross acute central embolus seen. Nodular areas of consolidation in the left upper lobe. Mediastinal and hilar adenopathy, which may be secondary to reactive inflammation, infection, or lymphoproliferative disease 09/27/18 CT chest: Unusual findings of progressive nodularity throughout the LUL with associated mediastinal and bilateral hilar lymphadenopathy. These findings are very unusual, but are presumably of infectious etiology in this young patient. Neoplasm is not excluded, but not strongly favored 10/08/18 EBUS/ENB: extensive diffuse airway inflammation/cobblestoning. FNA of subcarinal nodes: non-necrotizing granulomatous inflammation. LUL nodular opacity: non-necrotizing granulomatous inflammation  INTERVAL:  Bronchoscopy 08/03.   SUBJ:  He is here to review results of recent bronchoscopic biopsies.  He has no new complaints.  We discussed the results which are consistent with sarcoidosis, the likely diagnosis.  Review of systems indicates no fever, purulent sputum, hemoptysis.  He does continue to have intermittent, vague L sided chest discomfort.  He denies lower extremity edema and calf tenderness.  He denies arthralgias.  He does note dry eyes.  He continues to smoke and is making efforts to quit.    Vitals:   10/16/18 0955  BP: 128/80  Pulse: 96  Temp: (!) 97.4 F (36.3 C)  TempSrc: Temporal  SpO2: 98%  Weight: 233 lb (105.7 kg)  Height: 6\' 1"  (1.854 m)     EXAM:  Gen: NAD HEENT: NCAT, sclerae white Neck: No JVD Lungs: breath  sounds full, no wheezes or other adventitious sounds Cardiovascular: RRR, no murmurs Abdomen: Soft, nontender, normal BS Ext: without clubbing, cyanosis, edema Neuro: grossly intact Skin: Limited exam, no lesions noted   DATA:   BMP Latest Ref Rng & Units 03/27/2018 12/21/2017 09/04/2017  Glucose 70 - 99 mg/dL 93 107(H) 109(H)  BUN 6 - 20 mg/dL 14 13 13   Creatinine 0.61 - 1.24 mg/dL 0.93 0.94 1.16  Sodium 135 - 145 mmol/L 139 139 140  Potassium 3.5 - 5.1 mmol/L 3.5 3.7 4.0  Chloride 98 - 111 mmol/L 105 104 103  CO2 22 - 32 mmol/L 28 29 27   Calcium 8.9 - 10.3 mg/dL 8.6(L) 9.0 9.0    CBC Latest Ref Rng & Units 10/02/2018 03/27/2018 12/21/2017  WBC 4.0 - 10.5 K/uL 4.7 7.8 6.1  Hemoglobin 13.0 - 17.0 g/dL 14.3 13.4 14.7  Hematocrit 39.0 - 52.0 % 43.7 41.8 43.8  Platelets 150 - 400 K/uL 261 303 286    CXR: No new film  I have personally reviewed all chest radiographs reported above including CXRs and CT chest unless otherwise indicated  IMPRESSION:     ICD-10-CM   1. 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 Smoker  F17.200    Radiographic pattern, demographics and biopsy results are all consistent with sarcoidosis.  His respiratory/thoracic symptoms are likely attributable to this.  Also, he has hyperpigmented rash on his trunk which might be attributable to sarcoidosis.  He acknowledges dry eyes raising concern  for ophthalmic involvement.  Airway cobblestoning noted on bronchoscopy.  Previous evaluation indicates a normal EKG and normal BNP making cardiac involvement unlikely.  Previous calcium levels are normal  As far as I can tell, he has never had liver function test performed.  PLAN:  1) blood test today: BMP, LFTs 2) referral made to ophthalmology for evaluation of xerophthalmia 3)  new prescription: Prednisone 40 mg daily x5, 30 mg daily x5, 20 mg daily x5, 10 mg daily thereafter until seen in follow-up 4) new prescription: Flovent HFA 220 mcg, 2 actuations twice daily.  He is instructed to rinse mouth thoroughly after use. 5) follow-up in 4-6 weeks with Dr. Patsey Berthold.  Chest x-ray and PFTs ordered prior to that visit.  It is acknowledged that we might not be able to get PFTs performed prior to that due to delays related to the coronavirus pandemic.  Merton Border, MD PCCM service Mobile [phone] Pager [phone] 10/17/2018 11:42 AM

## 2018-10-24 ENCOUNTER — Other Ambulatory Visit: Payer: Self-pay

## 2018-10-24 ENCOUNTER — Ambulatory Visit
Admission: EM | Admit: 2018-10-24 | Discharge: 2018-10-24 | Disposition: A | Discharge status: Home / Self Care | Payer: Medicaid Other | Attending: Family Medicine | Admitting: Family Medicine

## 2018-10-24 ENCOUNTER — Ambulatory Visit: Payer: Medicaid Other

## 2018-10-24 DIAGNOSIS — M25461 Effusion, right knee: Secondary | ICD-10-CM

## 2018-10-24 DIAGNOSIS — Y9301 Activity, walking, marching and hiking: Secondary | ICD-10-CM

## 2018-10-24 DIAGNOSIS — M25561 Pain in right knee: Secondary | ICD-10-CM | POA: Diagnosis not present

## 2018-10-24 MED ORDER — MELOXICAM 15 MG PO TABS: 15.0000 mg | ORAL_TABLET | Freq: Every day | ORAL | 0 refills | Status: DC | PRN

## 2018-10-24 NOTE — ED Provider Notes (Signed)
[facility] ____________________________________________  Time seen: Approximately 8:55 AM  I have reviewed the triage vital signs and the nursing notes.   HISTORY  Chief Complaint Knee Pain (right)  HPI Luis Mcknight is a 38 y.o. male presenting for evaluation of right knee pain.  Patient reports yesterday he had finished a 3 mile walk as he was trying to get back into exercise, and states he was standing and he felt like his knee shifted to the inside and then buckled causing him to lose his balance.  States he did not fall to the ground but fell against the wall catching himself.  States he has had continued knee pain since.  Has remained ambulatory with steady gait, but reports painful doing so and tight.  Denies history of similar in the past.  Did take Tylenol last night but did not resolve.  Denies other alleviating measures.  No paresthesias or pain radiation.  Reports otherwise doing well.  Denies recent sickness or fevers.    Past Medical History:  Diagnosis Date  . Arthritis   . Depression    NO MEDS  . GERD (gastroesophageal reflux disease)    NO MEDS  . Hypertension   . Pneumonia    JUNE 2020    Patient Active Problem List   Diagnosis Date Noted  . Lung nodules   . Adenopathy     Past Surgical History:  Procedure Laterality Date  . ELECTROMAGNETIC NAVIGATION BROCHOSCOPY Left 10/08/2018   Procedure: ELECTROMAGNETIC NAVIGATION BRONCHOSCOPY, LEFT;  Surgeon: Tyler Pita, MD;  Location: [facility] ORS;  Service: Cardiopulmonary;  Laterality: Left;  . ENDOBRONCHIAL ULTRASOUND Left 10/08/2018   Procedure: ENDOBRONCHIAL ULTRASOUND, LEFT;  Surgeon: Tyler Pita, MD;  Location: [facility] ORS;  Service: Cardiopulmonary;  Laterality: Left;  . NO PAST SURGERIES       No current facility-administered medications for this encounter.   Current Outpatient Medications:  .  acetaminophen (TYLENOL) 500 MG tablet, Take 500-1,000 mg by mouth every 6 (six) hours as  needed., Disp: , Rfl:  .  amLODipine (NORVASC) 10 MG tablet, Take 10 mg by mouth every morning. , Disp: , Rfl:  .  fluticasone (FLOVENT HFA) 220 MCG/ACT inhaler, Inhale 2 puffs into the lungs 2 (two) times daily. Rinse mouth thoroughly after use, Disp: 1 Inhaler, Rfl: 10 .  predniSONE (DELTASONE) 10 MG tablet, 40 mg (4 tabs) daily for 5 days, then 30 mg (3 tabs) daily for 5 days, then 20 mg (2 tabs) daily for 5 days, then 10 mg daily until seen in follow-up, Disp: 60 tablet, Rfl: 5 .  meloxicam (MOBIC) 15 MG tablet, Take 1 tablet (15 mg total) by mouth daily as needed., Disp: 10 tablet, Rfl: 0  Allergies Latex  Family History  Problem Relation Age of Onset  . Hypertension Mother   . Diabetes Mother   . Diabetes Father   . Hypertension Father     Social History Social History   Tobacco Use  . Smoking status: Current Every Day Smoker    Packs/day: 0.50    Years: 15.00    Pack years: 7.50    Types: Cigarettes  . Smokeless tobacco: Never Used  Substance Use Topics  . Alcohol use: Yes    Comment: socially  . Drug use: No    Review of Systems Constitutional: No fever. ENT: No sore throat. Cardiovascular: Denies chest pain. Respiratory: Denies shortness of breath. Musculoskeletal: Positive right knee pain. Skin: Negative for rash.   ____________________________________________   PHYSICAL  EXAM:  VITAL SIGNS: ED Triage Vitals  Enc Vitals Group     BP 10/24/18 0837 (!) 139/99     Pulse Rate 10/24/18 0837 64     Resp 10/24/18 0837 17     Temp 10/24/18 0837 98.3 F (36.8 C)     Temp Source 10/24/18 0837 Oral     SpO2 10/24/18 0837 100 %     Weight 10/24/18 0836 230 lb (104.3 kg)     Height 10/24/18 0836 6\' 1"  (1.854 m)     Head Circumference --      Peak Flow --      Pain Score 10/24/18 0836 7     Pain Loc --      Pain Edu? --      Excl. in [place]? --     Constitutional: Alert and oriented. Well appearing and in no acute distress. Eyes: Conjunctivae are normal.  PERRL. EOMI.      Head: Normocephalic and atraumatic. Cardiovascular: Normal rate, regular rhythm. Grossly normal heart sounds.  Good peripheral circulation. Respiratory: Normal respiratory effort without tachypnea nor retractions. Breath sounds are clear and equal bilaterally. No wheezes, rales, rhonchi. Musculoskeletal: Steady gait bilateral pedal pulses equal and easily palpated.   Except: Right knee tenderness along MCL and around patella, no appearance of dislocation, able to fully extend as well as flex, mild pain with medial and lateral stress, no pain with anterior posterior drawer test.  Right leg otherwise nontender. Neurologic:  Normal speech and language.  Skin:  Skin is warm, dry  Psychiatric: Mood and affect are normal. Speech and behavior are normal. Patient exhibits appropriate insight and judgment   ___________________________________________   LABS (all labs ordered are listed, but only abnormal results are displayed)  Labs Reviewed - No data to display ____________________________________________  Radiology  EXAM: RIGHT KNEE - COMPLETE 4+ VIEW  COMPARISON:  None.  FINDINGS: There is no acute fracture or dislocation. No significant arthritic changes. There is a small suprapatellar effusion. The soft tissues are unremarkable.  IMPRESSION: 1. No acute fracture or dislocation. 2. Small suprapatellar effusion.   Electronically Signed   By: Anner Crete M.D.   On: 10/24/2018 09:28    PROCEDURES Procedures   INITIAL IMPRESSION / ASSESSMENT AND PLAN / ED COURSE  Pertinent labs & imaging results that were available during my care of the patient were reviewed by me and considered in my medical decision making (see chart for details).  Well-appearing patient.  No acute distress.  Right knee pain since yesterday.  Patient description concerning for patellar subluxation versus dislocation.  Suspect strain injury to the MCL.  Right knee x-ray as above  per radiologist, no acute fracture dislocation, small suprapatellar effusion.  Right knee short immobilizer given to for support.  Directed to ice and rest.  Gradual increase activity as tolerated.  Mobic as needed.  Follow-up with orthopedic for continued complaints in 1 week.  Work note given.Discussed indication, risks and benefits of medications with patient.   Discussed follow up and return parameters including no resolution or any worsening concerns. Patient verbalized understanding and agreed to plan.   ____________________________________________   FINAL CLINICAL IMPRESSION(S) / ED DIAGNOSES  Final diagnoses:  Acute pain of right knee  Effusion of right knee     ED Discharge Orders         Ordered    meloxicam (MOBIC) 15 MG tablet  Daily PRN     10/24/18 0936  Note: This dictation was prepared with Dragon dictation along with smaller phrase technology. Any transcriptional errors that result from this process are unintentional.         Marylene Land, NP 10/24/18 1013

## 2018-10-24 NOTE — ED Triage Notes (Signed)
Patient complains of right knee pain that started after he walked around 3 miles yesterday. States that he was standing after his walk and his right knee "fell out towards the side", with continued pain today.

## 2018-10-24 NOTE — Discharge Instructions (Signed)
Take medication as prescribed. Rest. Drink plenty of fluids. Ice. Use brace.   Follow-up with orthopedic in 1 week as needed for continued pain.  Follow up with your primary care physician this week as needed. Return to Urgent care for new or worsening concerns.

## 2018-11-08 LAB — FUNGUS CULTURE RESULT

## 2018-11-08 LAB — FUNGUS CULTURE WITH STAIN

## 2018-11-08 LAB — FUNGAL ORGANISM REFLEX

## 2018-11-09 ENCOUNTER — Telehealth: Payer: Self-pay | Admitting: Pulmonary Disease

## 2018-11-09 ENCOUNTER — Other Ambulatory Visit: Payer: Self-pay

## 2018-11-09 NOTE — Telephone Encounter (Signed)
Pt is aware of date/time of covid testing. Nothing further is needed.  

## 2018-11-13 ENCOUNTER — Other Ambulatory Visit: Payer: Self-pay

## 2018-11-13 ENCOUNTER — Other Ambulatory Visit
Admission: RE | Admit: 2018-11-13 | Discharge: 2018-11-13 | Disposition: A | Discharge status: Home / Self Care | Payer: Medicaid Other | Source: Ambulatory Visit | Attending: Pulmonary Disease | Admitting: Pulmonary Disease

## 2018-11-13 DIAGNOSIS — Z20828 Contact with and (suspected) exposure to other viral communicable diseases: Secondary | ICD-10-CM | POA: Insufficient documentation

## 2018-11-13 DIAGNOSIS — Z01812 Encounter for preprocedural laboratory examination: Secondary | ICD-10-CM | POA: Insufficient documentation

## 2018-11-13 LAB — SARS CORONAVIRUS 2 (TAT 6-24 HRS): SARS Coronavirus 2: NEGATIVE

## 2018-11-14 ENCOUNTER — Ambulatory Visit: Payer: Medicaid Other | Attending: Pulmonary Disease

## 2018-11-14 DIAGNOSIS — F17210 Nicotine dependence, cigarettes, uncomplicated: Secondary | ICD-10-CM | POA: Diagnosis not present

## 2018-11-14 DIAGNOSIS — D869 Sarcoidosis, unspecified: Secondary | ICD-10-CM | POA: Diagnosis not present

## 2018-11-21 ENCOUNTER — Other Ambulatory Visit
Admission: RE | Admit: 2018-11-21 | Discharge: 2018-11-21 | Disposition: A | Discharge status: Home / Self Care | Payer: Medicaid Other | Source: Ambulatory Visit | Attending: Pulmonary Disease | Admitting: Pulmonary Disease

## 2018-11-21 ENCOUNTER — Ambulatory Visit
Admission: RE | Admit: 2018-11-21 | Discharge: 2018-11-21 | Disposition: A | Discharge status: Home / Self Care | Payer: Medicaid Other | Source: Ambulatory Visit | Attending: Pulmonary Disease | Admitting: Pulmonary Disease

## 2018-11-21 ENCOUNTER — Encounter: Payer: Self-pay | Admitting: Pulmonary Disease

## 2018-11-21 ENCOUNTER — Ambulatory Visit (INDEPENDENT_AMBULATORY_CARE_PROVIDER_SITE_OTHER): Payer: Medicaid Other | Admitting: Pulmonary Disease

## 2018-11-21 ENCOUNTER — Ambulatory Visit: Payer: Medicaid Other | Admitting: Pulmonary Disease

## 2018-11-21 ENCOUNTER — Other Ambulatory Visit: Payer: Self-pay

## 2018-11-21 VITALS — BP 128/72 | HR 99 | Temp 98.1°F | Ht 73.0 in | Wt 237.0 lb

## 2018-11-21 DIAGNOSIS — R253 Fasciculation: Secondary | ICD-10-CM | POA: Insufficient documentation

## 2018-11-21 DIAGNOSIS — M7918 Myalgia, other site: Secondary | ICD-10-CM | POA: Diagnosis not present

## 2018-11-21 DIAGNOSIS — D869 Sarcoidosis, unspecified: Secondary | ICD-10-CM

## 2018-11-21 DIAGNOSIS — A498 Other bacterial infections of unspecified site: Secondary | ICD-10-CM | POA: Diagnosis not present

## 2018-11-21 DIAGNOSIS — F17210 Nicotine dependence, cigarettes, uncomplicated: Secondary | ICD-10-CM | POA: Diagnosis not present

## 2018-11-21 DIAGNOSIS — G8929 Other chronic pain: Secondary | ICD-10-CM | POA: Diagnosis not present

## 2018-11-21 DIAGNOSIS — R002 Palpitations: Secondary | ICD-10-CM | POA: Diagnosis not present

## 2018-11-21 LAB — RENAL FUNCTION PANEL
Albumin: 4.1 g/dL (ref 3.5–5.0)
Anion gap: 7 (ref 5–15)
BUN: 12 mg/dL (ref 6–20)
CO2: 27 mmol/L (ref 22–32)
Calcium: 9.3 mg/dL (ref 8.9–10.3)
Chloride: 104 mmol/L (ref 98–111)
Creatinine, Ser: 0.88 mg/dL (ref 0.61–1.24)
GFR calc Af Amer: >60 mL/min (ref >60)
GFR calc non Af Amer: >60 mL/min (ref >60)
Glucose, Bld: 106 mg/dL — ABNORMAL HIGH (ref 70–99)
Phosphorus: 2.8 mg/dL (ref 2.5–4.6)
Potassium: 4.5 mmol/L (ref 3.5–5.1)
Sodium: 138 mmol/L (ref 135–145)

## 2018-11-21 LAB — CALCIUM: Calcium: 9.3 mg/dL (ref 8.9–10.3)

## 2018-11-21 MED ORDER — CIPROFLOXACIN HCL 750 MG PO TABS: 750.0000 mg | ORAL_TABLET | Freq: Two times a day (BID) | ORAL | 0 refills | Status: AC

## 2018-11-21 NOTE — Progress Notes (Signed)
Subjective:    Patient ID: Luis Mcknight, male    DOB: 28-Apr-1980, 38 y.o.   MRN: [id number]  HPI Patient is a 38 year old current smoker (half PPD) who presents for follow-up on sarcoidosis.  Previously followed with Dr. Alva Garnet.  He underwent bronchoscopy on 3 August that showed classic findings of sarcoidosis with cobblestoning of the airway, he also had mediastinal adenopathy.  He underwent navigational bronchoscopy as well as endobronchial ultrasound.  Subsequently his pathology showed noncaseating granulomas consistent with sarcoidosis.  He has been on a prednisone taper and has been on 10 mg daily since his tapered finished.  He is also on Flovent HFA but does not tolerated well due to sore throat after use even when he rinses.  He also feels that the inhaler makes him cough more.  He had pulmonary function testing performed on 9 September that was consistent with a mild restrictive/obstructive physiology with "canceling out effect" he also had mild to moderate diffusion capacity impairment.  Overall however his lung function appears to be fairly well-preserved.  Fusion capacity was at 61%.    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  The organism is sensitive to ciprofloxacin.  He has not had treatment yet for this and does endorse cough productive of yellowish to greenish sputum.  He has not had any fevers, chills or sweats.  He has felt somewhat better with regards to shortness of breath since he started on prednisone.  He voices no other complaint today except for occasional palpitations and "muscles twitching ".  He also has noted some occasional atypical chest pain.  He continues to have issues with arthralgias particularly in the hands.  Occasional myalgias as well.  Review of Systems  Constitutional: Negative.  Negative for chills and fever.  HENT: Positive for congestion and postnasal  drip.   Eyes: Positive for itching.  Respiratory: Positive for shortness of breath.   Cardiovascular: Positive for chest pain and palpitations.  Gastrointestinal: Negative.   Endocrine: Negative.   Genitourinary: Negative.   Musculoskeletal: Positive for arthralgias and myalgias.  Skin: Negative.   Hematological: Negative.   Psychiatric/Behavioral: Negative.   All other systems reviewed and are negative.      Objective:   Physical Exam Vitals signs and nursing note reviewed.  Constitutional:      Appearance: Normal appearance. He is overweight.  HENT:     Head: Normocephalic and atraumatic.     Right Ear: External ear normal.     Left Ear: External ear normal.     Nose:     Comments: Nose/mouth/throat not examined due to masking requirements for COVID 19. Eyes:     General: No scleral icterus.    Conjunctiva/sclera: Conjunctivae normal.     Pupils: Pupils are equal, round, and reactive to light.  Neck:     Musculoskeletal: Neck supple.     Thyroid: No thyromegaly.     Trachea: Trachea and phonation normal.  Cardiovascular:     Rate and Rhythm: Normal rate and regular rhythm.     Pulses: Normal pulses.     Heart sounds: Normal heart sounds. No murmur.  Pulmonary:     Effort: Pulmonary effort is normal.     Breath sounds: No wheezing or rhonchi.     Comments: Coarse breath sounds throughout. Abdominal:     General: There is no distension.  Musculoskeletal: Normal range of motion.     Right lower leg: No  edema.     Left lower leg: No edema.  Lymphadenopathy:     Cervical: No cervical adenopathy.  Skin:    General: Skin is warm and dry.  Neurological:     General: No focal deficit present.     Mental Status: He is alert and oriented to person, place, and time.  Psychiatric:        Mood and Affect: Mood normal.        Behavior: Behavior normal.   Microbiology report from bronchoscopy 08 October 2018: Specimen Description BRONCHIAL ALVEOLAR LAVAGE  Performed at  [facility], [address], [place], [place] [address]   Special Requests TISSUE  Performed at [facility], [place], [place] [address]   Gram Stain RARE WBC PRESENT, PREDOMINANTLY MONONUCLEAR  RARE GRAM VARIABLE ROD  Performed at [facility] [address] [place],  [address]   Culture Abnormal  70,000 COLONIES/mL BURKHOLDERIA SPECIES  MULTI-DRUG RESISTANT ORGANISM    Report Status 10/14/2018 FINAL   Organism ID, Bacteria BURKHOLDERIA SPECIESAbnormal    Resulting Agency [facility]  Susceptibility   Burkholderia species    MIC    CEFAZOLIN >=64 RESIST... Resistant    CEFEPIME RESISTANT  Resistant    CIPROFLOXACIN 1 SENSITIVE  Sensitive    GENTAMICIN >=16 RESIST... Resistant    IMIPENEM >=16 RESIST... Resistant    TRIMETH/SULFA 80 RESISTANT  Resistant       Chest x-ray today: Some worsening of his left upper lobe findings from prior.        Assessment & Plan:   1.  Sarcoidosis: Continue prednisone at 10 mg daily.  Will treat infectious process noted on BAL as noted below.  Chest x-ray today was reviewed independently shows some slight worsening of the left upper lobe process and believe the patient will require to continue prednisone for now.  2.  Palpitations and muscle twitches: Will check electrolytes.  Symptom is concerning as sarcoidosis can also affect the heart.  Await electrolyte determination.  If no abnormalities will proceed with further cardiac evaluation.  3.  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  4.  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  Continue prednisone for now.  5.  Tobacco dependence due to cigarettes: Patient counseled with regards to discontinuation of smoking.   We will see the patient in follow-up in 2 to 3 weeks time he is  to contact us prior to that time should any new difficulties arise.   This chart was dictated using voice recognition software/Dragon.  Despite best efforts to proofread, errors can occur which can change the meaning.  Any change was purely unintentional.

## 2018-11-21 NOTE — Patient Instructions (Signed)
1.  Continue prednisone at 10 mg daily for now  2.  We will check electrolytes to make sure your potassium magnesium and calcium are doing okay as these can cause muscle cramping and twitching  3.  Get your chest x-ray done today.  4.  We have started you on Cipro 750 mg twice a day for an infection of Burkholderia which was noted on your specimens from the bronchoscopy.  5.  We will see you in follow-up in 2 to 3 weeks time

## 2018-11-26 ENCOUNTER — Telehealth: Payer: Self-pay

## 2018-11-26 NOTE — Telephone Encounter (Signed)
Received Disability forms. Forms have been faxed to [employer].

## 2018-11-28 NOTE — Progress Notes (Signed)
Already resulted

## 2018-12-07 ENCOUNTER — Other Ambulatory Visit: Payer: Self-pay

## 2018-12-07 ENCOUNTER — Ambulatory Visit: Payer: Medicaid Other | Admitting: Pulmonary Disease

## 2018-12-07 ENCOUNTER — Encounter: Payer: Self-pay | Admitting: Pulmonary Disease

## 2018-12-07 VITALS — BP 128/70 | HR 91 | Temp 98.3°F | Ht 73.0 in | Wt 238.0 lb

## 2018-12-07 DIAGNOSIS — R002 Palpitations: Secondary | ICD-10-CM | POA: Diagnosis not present

## 2018-12-07 DIAGNOSIS — D869 Sarcoidosis, unspecified: Secondary | ICD-10-CM

## 2018-12-07 DIAGNOSIS — M7918 Myalgia, other site: Secondary | ICD-10-CM

## 2018-12-07 DIAGNOSIS — A498 Other bacterial infections of unspecified site: Secondary | ICD-10-CM

## 2018-12-07 DIAGNOSIS — F17210 Nicotine dependence, cigarettes, uncomplicated: Secondary | ICD-10-CM

## 2018-12-07 DIAGNOSIS — G4733 Obstructive sleep apnea (adult) (pediatric): Secondary | ICD-10-CM | POA: Diagnosis not present

## 2018-12-07 DIAGNOSIS — G8929 Other chronic pain: Secondary | ICD-10-CM

## 2018-12-07 MED ORDER — AEROCHAMBER MV MISC: 0 refills | Status: DC

## 2018-12-07 MED ORDER — BUDESONIDE-FORMOTEROL FUMARATE 80-4.5 MCG/ACT IN AERO: 2.0000 | INHALATION_SPRAY | Freq: Two times a day (BID) | RESPIRATORY_TRACT | 0 refills | Status: DC

## 2018-12-07 NOTE — Patient Instructions (Signed)
1.  We will keep your prednisone at 20 mg daily for 2 weeks then decrease to 10 mg daily  2.  We have given you a trial of Symbicort 80/4.5, 2 puffs twice a day, please call us if this is helpful.  This way we can send a prescription for you to your pharmacy.  3.  We have scheduled for a heart test  4.  We will see you in follow-up in 4 to 6 weeks time, we will call you with the results of your heart test

## 2018-12-07 NOTE — Progress Notes (Signed)
Subjective:    Patient ID: Luis Mcknight, male    DOB: 02/22/1981, 38 y.o.   MRN: [id number]  HPI Patient is a 38 year old current smoker (half PPD) who presents for follow-up on sarcoidosis.  Previously followed with Dr. Alva Garnet.  He underwent bronchoscopy on 3 August by Dr. Mortimer Fries, that showed classic findings of sarcoidosis with cobblestoning of the airway, he also had mediastinal adenopathy.  He underwent navigational bronchoscopy as well as endobronchial ultrasound. Subsequently his pathology showed noncaseating granulomas consistent with sarcoidosis.  He has been on a prednisone taper and has been on 10 mg daily since his tapered finished.    He developed more shortness of breath over the last week and he started using Flovent HFA again however continues to not tolerate it well.  He states it makes him cough more.  He had pulmonary function testing performed on 9 September that was consistent with a mild restrictive/obstructive physiology with "canceling out effect" he also had mild to moderate diffusion capacity impairment.  Overall however his lung function appears to be fairly well-preserved.    Diffusion capacity was at 61%.  He did have evidence however of mild obstruction which may be aggravating his symptoms currently.  He also states that he did better when his prednisone dose was higher.  He did complete antibiotic therapy for Burkholderia  species which is multidrug-resistant but sensitive to Cipro.  He has noted that his sputum has cleared but he still has some occasional yellowish sputum in the mornings.  He also notes persistent palpitations.  Previously complained of muscles twitching, however this complaint has now resolved.  We did check electrolytes during his last visit and these were normal.  He has not had any fevers, chills or sweats.  No hemoptysis.  He continues to have some vague left-sided chest pain which is atypical.   Review of Systems  Constitutional: Negative.   Negative for chills and fever.  HENT: Positive for congestion and postnasal drip.   Eyes: Negative.   Respiratory: Positive for shortness of breath.   Cardiovascular: Positive for chest pain and palpitations.  Gastrointestinal: Negative.   Endocrine: Negative.   Genitourinary: Negative.   Musculoskeletal: Positive for arthralgias and myalgias.  Skin: Negative.   Hematological: Negative.   Psychiatric/Behavioral: Negative.   All other systems reviewed and are negative.      Objective:   Physical Exam Vitals signs and nursing note reviewed.  Constitutional:      Appearance: Normal appearance. He is overweight.  HENT:     Head: Normocephalic and atraumatic.     Right Ear: External ear normal.     Left Ear: External ear normal.     Nose:     Comments: Nose/mouth/throat not examined due to masking requirements for COVID 19. Eyes:     General: No scleral icterus.    Conjunctiva/sclera: Conjunctivae normal.     Pupils: Pupils are equal, round, and reactive to light.  Neck:     Musculoskeletal: Neck supple.     Thyroid: No thyromegaly.     Trachea: Trachea and phonation normal.  Cardiovascular:     Rate and Rhythm: Normal rate and regular rhythm.     Pulses: Normal pulses.     Heart sounds: Normal heart sounds. No murmur.  Pulmonary:     Effort: Pulmonary effort is normal.     Breath sounds: No wheezing or rhonchi.     Comments: Coarse breath sounds throughout. Abdominal:     General: There is no  distension.  Musculoskeletal: Normal range of motion.     Right lower leg: No edema.     Left lower leg: No edema.  Lymphadenopathy:     Cervical: No cervical adenopathy.  Skin:    General: Skin is warm and dry.  Neurological:     General: No focal deficit present.     Mental Status: He is alert and oriented to person, place, and time.  Psychiatric:        Mood and Affect: Mood normal.        Behavior: Behavior normal.       Assessment & Plan:   1.  Sarcoidosis: Will  increase prednisone to 20 mg daily for the next 2 weeks then decrease to 10 mg daily after that.  He does have combination of obstructive/restrictive physiology on pulmonary function testing we will give him a trial of Symbicort 80/4.5, 2 puffs twice a day he will be provided inhaler spacer.  He is to let us know if this is effective and we can send the prescription in to his pharmacy.  Obstructive physiology may be related to "cobblestoning" of the bronchi due to sarcoid granulomatous inflammation.  There is also a component of "sarcoid bronchitis".  2.  Palpitations and atypical chest pain:  Will obtain 2D echo, pending 2D echo results patient may need cardiac MRI as sarcoidosis can have cardiac involvement as well.   3.  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  4.  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    Observe respond on increased dose of prednisone.  5.  Tobacco dependence due to cigarettes: Patient counseled with regards to discontinuation of smoking.  Total counseling time 3 to 5 minutes   Patient was offered influenza vaccine today but declines.   This chart was dictated using voice recognition software/Dragon.  Despite best efforts to proofread, errors can occur which can change the meaning.  Any change was purely unintentional.

## 2018-12-11 ENCOUNTER — Ambulatory Visit
Admission: RE | Admit: 2018-12-11 | Discharge: 2018-12-11 | Disposition: A | Discharge status: Home / Self Care | Payer: Medicaid Other | Source: Ambulatory Visit | Attending: Pulmonary Disease | Admitting: Pulmonary Disease

## 2018-12-11 ENCOUNTER — Other Ambulatory Visit: Payer: Self-pay

## 2018-12-11 DIAGNOSIS — D869 Sarcoidosis, unspecified: Secondary | ICD-10-CM | POA: Diagnosis not present

## 2018-12-11 DIAGNOSIS — I083 Combined rheumatic disorders of mitral, aortic and tricuspid valves: Secondary | ICD-10-CM | POA: Insufficient documentation

## 2018-12-11 NOTE — Progress Notes (Signed)
*  PRELIMINARY RESULTS* Echocardiogram 2D Echocardiogram has been performed.  Sherrie Sport 12/11/2018, 10:53 AM

## 2018-12-21 ENCOUNTER — Telehealth: Payer: Self-pay | Admitting: Pulmonary Disease

## 2018-12-21 MED ORDER — BUDESONIDE-FORMOTEROL FUMARATE 80-4.5 MCG/ACT IN AERO: 2.0000 | INHALATION_SPRAY | Freq: Two times a day (BID) | RESPIRATORY_TRACT | 5 refills | Status: DC

## 2018-12-21 NOTE — Telephone Encounter (Signed)
Left message informing patient his refill has been sent in. Nothing further needed at this time.

## 2019-01-01 ENCOUNTER — Encounter: Payer: Self-pay | Admitting: Pulmonary Disease

## 2019-01-01 ENCOUNTER — Ambulatory Visit: Payer: Medicaid Other | Admitting: Pulmonary Disease

## 2019-01-01 ENCOUNTER — Other Ambulatory Visit: Payer: Self-pay

## 2019-01-01 ENCOUNTER — Other Ambulatory Visit
Admission: RE | Admit: 2019-01-01 | Discharge: 2019-01-01 | Disposition: A | Discharge status: Home / Self Care | Payer: Medicaid Other | Source: Ambulatory Visit | Attending: Pulmonary Disease | Admitting: Pulmonary Disease

## 2019-01-01 VITALS — BP 128/86 | HR 104 | Temp 98.1°F | Ht 73.0 in | Wt 242.0 lb

## 2019-01-01 DIAGNOSIS — G8929 Other chronic pain: Secondary | ICD-10-CM

## 2019-01-01 DIAGNOSIS — R0683 Snoring: Secondary | ICD-10-CM | POA: Diagnosis not present

## 2019-01-01 DIAGNOSIS — D869 Sarcoidosis, unspecified: Secondary | ICD-10-CM | POA: Diagnosis not present

## 2019-01-01 DIAGNOSIS — F17210 Nicotine dependence, cigarettes, uncomplicated: Secondary | ICD-10-CM

## 2019-01-01 DIAGNOSIS — M7918 Myalgia, other site: Secondary | ICD-10-CM

## 2019-01-01 DIAGNOSIS — R002 Palpitations: Secondary | ICD-10-CM

## 2019-01-01 DIAGNOSIS — A498 Other bacterial infections of unspecified site: Secondary | ICD-10-CM

## 2019-01-01 LAB — COMPREHENSIVE METABOLIC PANEL
ALT: 52 U/L — ABNORMAL HIGH (ref 0–44)
AST: 23 U/L (ref 15–41)
Albumin: 4.1 g/dL (ref 3.5–5.0)
Alkaline Phosphatase: 101 U/L (ref 38–126)
Anion gap: 10 (ref 5–15)
BUN: 16 mg/dL (ref 6–20)
CO2: 30 mmol/L (ref 22–32)
Calcium: 9.4 mg/dL (ref 8.9–10.3)
Chloride: 101 mmol/L (ref 98–111)
Creatinine, Ser: 1.04 mg/dL (ref 0.61–1.24)
GFR calc Af Amer: >60 mL/min (ref >60)
GFR calc non Af Amer: >60 mL/min (ref >60)
Glucose, Bld: 102 mg/dL — ABNORMAL HIGH (ref 70–99)
Potassium: 4.2 mmol/L (ref 3.5–5.1)
Sodium: 141 mmol/L (ref 135–145)
Total Bilirubin: 0.6 mg/dL (ref 0.3–1.2)
Total Protein: 7.8 g/dL (ref 6.5–8.1)

## 2019-01-01 LAB — CBC WITH DIFFERENTIAL/PLATELET
Abs Immature Granulocytes: 0.06 10*3/uL (ref 0.00–0.07)
Basophils Absolute: 0 10*3/uL (ref 0.0–0.1)
Basophils Relative: 0 %
Eosinophils Absolute: 0.1 10*3/uL (ref 0.0–0.5)
Eosinophils Relative: 1 %
HCT: 45.7 % (ref 39.0–52.0)
Hemoglobin: 14.7 g/dL (ref 13.0–17.0)
Immature Granulocytes: 1 %
Lymphocytes Relative: 18 %
Lymphs Abs: 1.3 10*3/uL (ref 0.7–4.0)
MCH: 25.8 pg — ABNORMAL LOW (ref 26.0–34.0)
MCHC: 32.2 g/dL (ref 30.0–36.0)
MCV: 80.3 fL (ref 80.0–100.0)
Monocytes Absolute: 0.7 10*3/uL (ref 0.1–1.0)
Monocytes Relative: 10 %
Neutro Abs: 4.9 10*3/uL (ref 1.7–7.7)
Neutrophils Relative %: 70 %
Platelets: 237 10*3/uL (ref 150–400)
RBC: 5.69 MIL/uL (ref 4.22–5.81)
RDW: 14.2 % (ref 11.5–15.5)
WBC: 7.1 10*3/uL (ref 4.0–10.5)
nRBC: 0 % (ref 0.0–0.2)

## 2019-01-01 MED ORDER — BUDESONIDE-FORMOTEROL FUMARATE 160-4.5 MCG/ACT IN AERO: 2.0000 | INHALATION_SPRAY | Freq: Two times a day (BID) | RESPIRATORY_TRACT | 12 refills | Status: DC

## 2019-01-01 NOTE — Patient Instructions (Signed)
1.  We will recheck a CT scan of the chest to see if we can start decreasing her prednisone dose.  2.  We will check blood test to check on the activity of your sarcoid.  3.  We are going to check at home sleep study to evaluate your snoring awakenings at night  4.  We have increased the Symbicort dose to the 160 dose, 2 puffs twice a day, use your spacer, make sure that you rinse your mouth well after use.  5.  We will see you in follow-up in 3 to 4 weeks time.

## 2019-01-01 NOTE — Progress Notes (Signed)
Subjective:    Patient ID: Luis Mcknight, male    DOB: 1980/10/14, 38 y.o.   MRN: [id number]  HPI Patient is a 38 year old current smoker (5 cigarettes/day) who presents for follow-up on sarcoidosis.  Recall that he was being followed by Dr. Merton Border, I have assumed care since Dr. Alva Garnet left the practice.  Patient had bronchoscopy on 3 August by Dr. Mortimer Fries that showed classic findings of sarcoidosis with cobblestoning of the airway.  The patient also had mediastinal adenopathy.  Pathology showed noncaseating granulomas consistent with sarcoidosis.  He had been on a prednisone taper during his last visit here on 2 October we increased the dose of prednisone to 20 mg daily due to recurrent symptoms of dyspnea.  The patient was also started on Symbicort 80/4.52 puffs twice a day which he feels have brought him significant improvement with regards to his dyspnea.  In addition, there was evidence of Burkholderia species in the patient's bronchoscopy specimens and he was treated with Cipro this has also improved his symptoms.  Recall that the patient was having issues with palpitations he has undergone 2D echo that was performed on 6 October this did not show any major issues left ventricular ejection fraction was 60 to 65% no evidence of pulmonary hypertension mild aortic dilatation pulmonary artery dilatation noted.  Since that time the patient has not had any more palpitations.  Patient's new symptom related to nocturnal awakenings he feels that he is choking when he awakens.  He has noted that occasionally he is awakened by a loud snore.  He has not had any fevers, chills or sweats.  No cough or sputum production, no hemoptysis.   Review of Systems  Constitutional: Negative.  Negative for chills and fever.  HENT: Positive for congestion and postnasal drip.   Eyes: Negative.   Respiratory: Positive for shortness of breath.   Cardiovascular: Negative.   Gastrointestinal: Negative.    Endocrine: Negative.   Genitourinary: Negative.   Musculoskeletal: Negative.   Skin: Negative.   Hematological: Negative.   Psychiatric/Behavioral: Negative.   All other systems reviewed and are negative.      Objective:   Physical Exam Vitals signs and nursing note reviewed.  Constitutional:      Appearance: Normal appearance. He is overweight.  HENT:     Head: Normocephalic and atraumatic.     Right Ear: External ear normal.     Left Ear: External ear normal.     Nose:     Comments: Nose/mouth/throat not examined due to masking requirements for COVID 19. Eyes:     General: No scleral icterus.    Conjunctiva/sclera: Conjunctivae normal.     Pupils: Pupils are equal, round, and reactive to light.  Neck:     Musculoskeletal: Neck supple.     Thyroid: No thyromegaly.     Trachea: Trachea and phonation normal.  Cardiovascular:     Rate and Rhythm: Normal rate and regular rhythm.     Pulses: Normal pulses.     Heart sounds: Normal heart sounds. No murmur.  Pulmonary:     Effort: Pulmonary effort is normal. No respiratory distress.     Breath sounds: No wheezing or rhonchi.  Abdominal:     General: There is no distension.  Musculoskeletal: Normal range of motion.     Right lower leg: No edema.     Left lower leg: No edema.  Lymphadenopathy:     Cervical: No cervical adenopathy.  Skin:    General: Skin  is warm and dry.  Neurological:     General: No focal deficit present.     Mental Status: He is alert and oriented to person, place, and time.  Psychiatric:        Mood and Affect: Mood normal.        Behavior: Behavior normal.        Assessment & Plan:   1. Sarcoidosis:  Continue prednisone to 20 mg daily for now.  He did not decrease to 10 mg as previously instructed.  He does have combination of obstructive/restrictive physiology on pulmonary function testing will increase Symbicort to 160 /4.5, 2 puffs twice a day in anticipation of decreasing the prednisone  dose.  He is to let us know if this is effective and we can send the prescription in to his pharmacy.  Obstructive physiology may be related to "cobblestoning" of the bronchi due to sarcoid granulomatous inflammation.  There is also a component of "sarcoid bronchitis".  We will repeat CT scan of the chest and if significantly improved we will start tapering of prednisone.  If persistent findings will consider adding methotrexate as the patient is having some issues of weight gain on prednisone.  We are obtaining ACE level and chemistries to follow-up on the patient's sarcoidosis activity.  2. Palpitations:  This symptom has resolved.  2D echo showed only very minimal issues but essentially normal.  3. 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  4. Chronic musculoskeletal pain and arthralgias:Likely related to sarcoid arthritis, this has improved with the increased dose of prednisone.  5. Tobacco dependence due to cigarettes: Patient counseled with regards to discontinuation of smoking.  Total counseling time 3 to 5 minutes.  This counseling was reinforced today.  He had been smoking a half a pack of cigarettes per day (10 cigarettes) down to 5 currently.  We will see the patient in follow-up in 3 to 4 weeks time.  He is to contact us prior to that time should any new difficulties arise.   This chart was dictated using voice recognition software/Dragon.  Despite best efforts to proofread, errors can occur which can change the meaning.  Any change was purely unintentional.

## 2019-01-02 LAB — ANGIOTENSIN CONVERTING ENZYME: Angiotensin-Converting Enzyme: 34 U/L (ref 14–82)

## 2019-01-14 ENCOUNTER — Other Ambulatory Visit: Payer: Self-pay

## 2019-01-14 ENCOUNTER — Ambulatory Visit
Admission: RE | Admit: 2019-01-14 | Discharge: 2019-01-14 | Disposition: A | Discharge status: Home / Self Care | Payer: Medicaid Other | Source: Ambulatory Visit | Attending: Pulmonary Disease | Admitting: Pulmonary Disease

## 2019-01-14 DIAGNOSIS — D869 Sarcoidosis, unspecified: Secondary | ICD-10-CM | POA: Insufficient documentation

## 2019-01-14 DIAGNOSIS — R911 Solitary pulmonary nodule: Secondary | ICD-10-CM | POA: Diagnosis not present

## 2019-01-17 DIAGNOSIS — H35412 Lattice degeneration of retina, left eye: Secondary | ICD-10-CM | POA: Diagnosis not present

## 2019-01-28 ENCOUNTER — Other Ambulatory Visit: Payer: Self-pay

## 2019-01-28 ENCOUNTER — Encounter: Payer: Self-pay | Admitting: Pulmonary Disease

## 2019-01-28 ENCOUNTER — Ambulatory Visit: Payer: Medicaid Other | Admitting: Pulmonary Disease

## 2019-01-28 VITALS — BP 126/78 | HR 99 | Temp 97.8°F | Ht 74.0 in | Wt 245.4 lb

## 2019-01-28 DIAGNOSIS — F17210 Nicotine dependence, cigarettes, uncomplicated: Secondary | ICD-10-CM | POA: Diagnosis not present

## 2019-01-28 DIAGNOSIS — R002 Palpitations: Secondary | ICD-10-CM | POA: Diagnosis not present

## 2019-01-28 DIAGNOSIS — J4520 Mild intermittent asthma, uncomplicated: Secondary | ICD-10-CM

## 2019-01-28 DIAGNOSIS — G8929 Other chronic pain: Secondary | ICD-10-CM | POA: Diagnosis not present

## 2019-01-28 DIAGNOSIS — A498 Other bacterial infections of unspecified site: Secondary | ICD-10-CM | POA: Diagnosis not present

## 2019-01-28 DIAGNOSIS — D869 Sarcoidosis, unspecified: Secondary | ICD-10-CM

## 2019-01-28 DIAGNOSIS — M7918 Myalgia, other site: Secondary | ICD-10-CM | POA: Diagnosis not present

## 2019-01-28 DIAGNOSIS — R0683 Snoring: Secondary | ICD-10-CM

## 2019-01-28 MED ORDER — QVAR REDIHALER 80 MCG/ACT IN AERB: 2.0000 | INHALATION_SPRAY | Freq: Two times a day (BID) | RESPIRATORY_TRACT | 3 refills | Status: DC

## 2019-01-28 MED ORDER — AZITHROMYCIN 250 MG PO TABS: ORAL_TABLET | ORAL | 0 refills | Status: AC

## 2019-01-28 NOTE — Progress Notes (Addendum)
Subjective:    Patient ID: Luis Mcknight, male    DOB: 1980-05-07, 38 y.o.   MRN: [id number]  HPI This is a 38 year old current smoker (half PPD) who presents for follow-up on sarcoidosis.  He was previously followed by Dr. Merton Border.  He underwent bronchoscopy on 08 October 2018 by Dr. Louie Bun that showed classic findings of sarcoidosis with cobblestoning of the airway.  Patient was also noted to have mediastinal adenopathy and underwent EBUS to diagnosis same.  He had classic noncaseating granulomas consistent with sarcoidosis on pathology.  He is currently on 10 mg of prednisone daily and he had been on Symbicort however he discontinued this due to palpitations.  In addition there was evidence of Burkholderia species and the patient's bronchoscopy specimens and he was treated with Cipro for this issue.  During his previous visit he had complained of snoring with nocturnal awakenings but this issue has resolved.  Palpitations have been a recurrent issue however these have resolved with discontinuing Symbicort.  His dyspnea is improved.  He is down to 10 mg of prednisone as noted above.  He had a CT scan of the chest on 14 January 2019 that shows overall improvement of his previously noted symptoms of pulmonary parenchymal nodules and mediastinal adenopathy.  This has been reviewed independently as well.  He continues to have cough productive of yellowish sputum.  No fevers, chills or sweats.  No hemoptysis.  Voices no other complaint.  He continues to smoke but has been counseled regards to discontinuation of smoking.  Declines flu vaccine.  Studies thus far:  03/27/2018 CT angio scan of the chest no PE, nodular areas of consolidation in the left upper lobe, mediastinal and hilar adenopathy  09/27/2018 CT scan of the chest progressive nodularity throughout the left upper lobe with associated mediastinal and bilateral hilar lymphadenopathy  10/08/2018 bronchoscopy with navigational  assistance and endobronchial ultrasound findings consistent with sarcoidosis  11/14/2018 pulmonary function testing relatively normal FEV1 3.85 L or 94% predicted.  FEV1/FVC 84%  12/11/2018 echocardiogram essentially normal  01/14/2019 repeat CT scan chest, improvement on previously read parenchymal nodules and coalescent infiltrates as well as improvement in mediastinal adenopathy   Review of Systems A 10 point review of systems was performed and it is as noted above otherwise negative.    Objective:   Physical Exam Vitals signs and nursing note reviewed.  Constitutional:      Appearance: Normal appearance. He is overweight.  HENT:     Head: Normocephalic and atraumatic.     Right Ear: External ear normal.     Left Ear: External ear normal.     Nose:     Comments: Nose/mouth/throat not examined due to masking requirements for COVID 19. Eyes:     General: No scleral icterus.    Conjunctiva/sclera: Conjunctivae normal.     Pupils: Pupils are equal, round, and reactive to light.  Neck:     Musculoskeletal: Neck supple.     Thyroid: No thyromegaly.     Trachea: Trachea and phonation normal.  Cardiovascular:     Rate and Rhythm: Normal rate and regular rhythm.     Pulses: Normal pulses.     Heart sounds: Normal heart sounds. No murmur.  Pulmonary:     Effort: Pulmonary effort is normal. No respiratory distress.     Breath sounds: No wheezing or rhonchi.  Abdominal:     General: There is no distension.  Musculoskeletal: Normal range of motion.  Right lower leg: No edema.     Left lower leg: No edema.  Lymphadenopathy:     Cervical: No cervical adenopathy.  Skin:    General: Skin is warm and dry.  Neurological:     General: No focal deficit present.     Mental Status: He is alert and oriented to person, place, and time.  Psychiatric:        Mood and Affect: Mood normal.        Behavior: Behavior normal.    Reviewed 14 January 2019 chest CT findings with the  patient.     Assessment & Plan:   1. Sarcoidosis with asthma overlap: Continue prednisone to 10 mg daily for now.  He did not tolerate Symbicort, will switch to Qvar 80 mcg 2 puffs twice a day this will allow [place] to hopefully decrease his prednisone dose further. He has obstructive physiology may be related to "cobblestoning" of the bronchi due to sarcoid granulomatous inflammation. There is also a component of "sarcoid bronchitis".    His follow-up CT scan shows improvement of his sarcoidosis findings in the lung.  2. Palpitations: This symptom has resolved and had briefly recurred but resolved after discontinuing Symbicort..  2D echo done previously, showed only very minimal issues but essentially normal.  3. 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  He does have some new bronchitis symptoms we will treat with a azithromycin.  I do not believe that these new symptoms are related to his Burkholderia.  However have low threshold for repeating Cipro therapy.  4. Chronic musculoskeletal pain and arthralgias: These have resolved.  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  5.  Snoring: He reports that this has resolved as well.  6. Tobacco dependence due to cigarettes: Patient counseled with regards to discontinuation of smoking.Total counseling time 3 to 5 minutes.  This counseling was reinforced today.  He had been smoking a half a pack of cigarettes per day (10 cigarettes) down to 5 currently.  We will see the patient in follow-up in 6-8 weeks time with me or the nurse practitioner.  He is to contact us prior to that time should any new difficulties arise.   Renold Don, MD [facility]   This chart was dictated using voice recognition software/Dragon.  Despite best efforts to proofread, errors can occur which can change the meaning.  Any change was purely  unintentional.

## 2019-01-28 NOTE — Patient Instructions (Addendum)
1.  Continue prednisone 10 mg daily.  2.  We have added Qvar 80 mcg 2 puffs twice a day hopefully this will allow [place] to get you off the prednisone altogether.  3.  Your most recent chest CT performed on November 9 showed your sarcoidosis is responding to the therapy.  Everything looks much better.  4.  We will give you a Z-Pak to see if we can help with the mild bronchitis.  5.  We will see him in follow-up in 6 to 8 weeks time.  Either me or the nurse practitioner.

## 2019-01-29 ENCOUNTER — Telehealth: Payer: Self-pay | Admitting: Pulmonary Disease

## 2019-01-29 ENCOUNTER — Other Ambulatory Visit: Payer: Self-pay

## 2019-01-29 NOTE — Telephone Encounter (Signed)
Pt is aware of date/time of covid test for sleep study.

## 2019-02-01 ENCOUNTER — Other Ambulatory Visit
Admission: RE | Admit: 2019-02-01 | Discharge: 2019-02-01 | Disposition: A | Discharge status: Home / Self Care | Payer: Medicaid Other | Source: Ambulatory Visit | Attending: Pulmonary Disease | Admitting: Pulmonary Disease

## 2019-02-01 DIAGNOSIS — Z20828 Contact with and (suspected) exposure to other viral communicable diseases: Secondary | ICD-10-CM | POA: Diagnosis not present

## 2019-02-01 DIAGNOSIS — Z01812 Encounter for preprocedural laboratory examination: Secondary | ICD-10-CM | POA: Diagnosis not present

## 2019-02-02 LAB — SARS CORONAVIRUS 2 (TAT 6-24 HRS): SARS Coronavirus 2: NEGATIVE

## 2019-02-06 ENCOUNTER — Ambulatory Visit: Payer: Medicaid Other | Attending: Pulmonary Disease

## 2019-02-07 ENCOUNTER — Other Ambulatory Visit: Payer: Self-pay

## 2019-02-14 ENCOUNTER — Telehealth: Payer: Self-pay | Admitting: Pulmonary Disease

## 2019-02-14 DIAGNOSIS — D869 Sarcoidosis, unspecified: Secondary | ICD-10-CM

## 2019-02-14 NOTE — Telephone Encounter (Signed)
Lets try Flovent HFA 110 mcg 2 puffs BID. Rinse mouth well after use.

## 2019-02-14 NOTE — Telephone Encounter (Signed)
Spoke to pt, who stated that qvar is not covered by insurance.  I have contacted [facility] and requested that PA request be faxed to our office.

## 2019-02-14 NOTE — Telephone Encounter (Signed)
Spoke to [place] with [facility], who stated covered alternatives for Qvar are flovent HFA and pulmicort neb.  LG please advise. Thanks

## 2019-02-14 NOTE — Telephone Encounter (Signed)
Pt is aware of below message and voiced his understanding.  Pt stated that he tried Flovent previously and he had some side effects.    LG , please advise. Thanks

## 2019-02-21 MED ORDER — BUDESONIDE 0.5 MG/2ML IN SUSP: 0.5000 mg | Freq: Two times a day (BID) | RESPIRATORY_TRACT | 2 refills | Status: DC

## 2019-02-21 NOTE — Telephone Encounter (Signed)
Rx for pulmicort 0.5 and neb machine has been placed. Pt is aware and voiced his understanding.  Nothing further is needed.

## 2019-02-21 NOTE — Telephone Encounter (Signed)
LG, please advise. Thanks 

## 2019-02-21 NOTE — Telephone Encounter (Signed)
Try Pulmicort 0.5 mg twice a day and nebulizer

## 2019-02-21 NOTE — Addendum Note (Signed)
Addended by: Maryanna Shape A on: 02/21/2019 02:36 PM   Modules accepted: Orders

## 2019-02-22 ENCOUNTER — Telehealth: Payer: Self-pay | Admitting: Pulmonary Disease

## 2019-02-22 DIAGNOSIS — J45909 Unspecified asthma, uncomplicated: Secondary | ICD-10-CM

## 2019-02-22 NOTE — Telephone Encounter (Signed)
Done

## 2019-02-22 NOTE — Telephone Encounter (Signed)
Spoke to [employer] with [employer] and provided her with ICD of J44.909. Jonelle Sidle is requesting that note be addend to mention asthma his current medications.  LG, please advise. Thanks

## 2019-02-22 NOTE — Telephone Encounter (Signed)
Spoke to [employer] with [employer] and made her aware of this information.  Tiffany was able to obtain note via epic. Nothing further is needed.

## 2019-02-22 NOTE — Telephone Encounter (Signed)
Use code for asthma

## 2019-02-22 NOTE — Telephone Encounter (Signed)
LG please advise on dx xode.  Sarcoid will not qualify for Pulmicort.

## 2019-02-25 DIAGNOSIS — J45909 Unspecified asthma, uncomplicated: Secondary | ICD-10-CM | POA: Diagnosis not present

## 2019-02-27 DIAGNOSIS — G4733 Obstructive sleep apnea (adult) (pediatric): Secondary | ICD-10-CM | POA: Diagnosis not present

## 2019-03-21 ENCOUNTER — Ambulatory Visit (INDEPENDENT_AMBULATORY_CARE_PROVIDER_SITE_OTHER): Payer: Medicaid Other | Admitting: Pulmonary Disease

## 2019-03-21 DIAGNOSIS — J310 Chronic rhinitis: Secondary | ICD-10-CM | POA: Diagnosis not present

## 2019-03-21 DIAGNOSIS — J45909 Unspecified asthma, uncomplicated: Secondary | ICD-10-CM

## 2019-03-21 DIAGNOSIS — G4733 Obstructive sleep apnea (adult) (pediatric): Secondary | ICD-10-CM | POA: Diagnosis not present

## 2019-03-21 DIAGNOSIS — R079 Chest pain, unspecified: Secondary | ICD-10-CM

## 2019-03-21 DIAGNOSIS — R002 Palpitations: Secondary | ICD-10-CM

## 2019-03-21 DIAGNOSIS — D869 Sarcoidosis, unspecified: Secondary | ICD-10-CM | POA: Diagnosis not present

## 2019-03-21 DIAGNOSIS — Z7952 Long term (current) use of systemic steroids: Secondary | ICD-10-CM

## 2019-03-21 MED ORDER — FLUTICASONE PROPIONATE 50 MCG/ACT NA SUSP: 1.0000 | Freq: Two times a day (BID) | NASAL | 6 refills | Status: DC

## 2019-03-21 NOTE — Patient Instructions (Addendum)
1.  We will refer you to the heart specialist to evaluate your chest pain and to consider a possible cardiac MRI  2.  I have sent a prescription for a nasal spray to see if it helps you with your issues with the congestion.  3.  We will see you in follow-up in 4 to 6 weeks time.

## 2019-03-21 NOTE — Progress Notes (Signed)
Subjective:    Patient ID: Luis Mcknight, male    DOB: [birth year], 39 y.o.   MRN: [id number]  Virtual Visit Via Video or Telephone Note:   This visit type was conducted due to national recommendations for restrictions regarding the COVID-19 pandemic. This format is felt to be most appropriate for this patient at this time.  All issues noted in this document were discussed and addressed.  No physical exam was performed (except for noted visual exam findings with Video Visits).    I connected with Luis Mcknight by telephone at 2:30 PM and verified that I was speaking with the correct person using two identifiers. Location patient: home Location provider: [facility] participating in the virtual visit: patient, physician   I discussed the limitations, risks, security and privacy concerns of performing an evaluation and management service by phone and the availability of in person appointments. The patient expressed understanding and agreed to proceed.  HPI Patient is a 39 year old gentleman, current smoker (1/4 pack a day) who we follow here for sarcoidosis.  He is currently maintained on 10 mg of prednisone daily.  His last visit here was on 28 January 2019.  He continues to have issues with chest pain and palpitations.  He had a prior 2D echo that was essentially normal.  However, sarcoidosis can affect the heart.  I discussed this with the patient and we will make referral to cardiology.  In addition we discussed his sleep study findings and we recommend a titration study.  It is hard to discern which symptoms are from sarcoidosis in which symptoms from the overlap of sleep apnea.  He has been having increasing problems with nasal congestion as well.  Patient has not had any fevers, chills or sweats.  No cough or sputum production.  He is tolerating 10 mg of prednisone.  His CT scan has shown improvement on this.  Studies thus far:  03/27/2018 CT angio scan of the  chest no PE, nodular areas of consolidation in the left upper lobe, mediastinal and hilar adenopathy  09/27/2018 CT scan of the chest progressive nodularity throughout the left upper lobe with associated mediastinal and bilateral hilar lymphadenopathy  10/08/2018 bronchoscopy with navigational assistance and endobronchial ultrasound findings consistent with sarcoidosis  11/14/2018 pulmonary function testing relatively normal FEV1 3.85 L or 94% predicted.  FEV1/FVC 84%  12/11/2018 echocardiogram essentially normal  01/14/2019 repeat CT scan chest, improvement on previously read parenchymal nodules and coalescent infiltrates as well as improvement in mediastinal adenopathy  02/08/2019 sleep study performed suggestive of severe sleep apnea, titration study recommended.   Review of Systems A 10 point review of systems was performed and it is as noted above otherwise negative.    Objective:   Physical Exam No physical exam performed as the visit was via telephone.  Patient did not exhibit conversational dyspnea during the visit.     Assessment & Plan:   Sarcoidosis Continue prednisone at 10 mg daily for now Continue inhaled corticosteroid as well We will see him in follow-up in 4 to 6 weeks time.  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 Derrill Kay, MD [facility]    *This note was dictated using voice  recognition software/Dragon.  Despite best efforts to proofread, errors can occur which can change the meaning.  Any change was purely unintentional.

## 2019-03-29 ENCOUNTER — Telehealth: Payer: Self-pay | Admitting: Pulmonary Disease

## 2019-03-29 DIAGNOSIS — G4733 Obstructive sleep apnea (adult) (pediatric): Secondary | ICD-10-CM

## 2019-03-29 DIAGNOSIS — R079 Chest pain, unspecified: Secondary | ICD-10-CM

## 2019-03-29 NOTE — Telephone Encounter (Signed)
Pt had [facility] Pt has not been made aware of results.  Advised pt that our office will contact him with a response.   Dr. Elsworth Soho please advise on results. Thanks

## 2019-04-01 NOTE — Telephone Encounter (Signed)
Please ask [facility] to send it to me - not on my patient list currently- just checked today Please also let them know, that new studies are also not showing up on my list for some reason

## 2019-04-01 NOTE — Telephone Encounter (Signed)
Lm for [facility] with [facility].

## 2019-04-02 NOTE — Telephone Encounter (Signed)
Spoke to [place] with [facility] and requested that sleep study be faxed to our [place] office.  Sharyn Lull stated that [facility] is not in epic and is unable to unload sleep study.   Will route to Dr. Elsworth Soho to make aware.

## 2019-04-02 NOTE — Telephone Encounter (Signed)
Study was read in morpheus on 12/23 Please check with [facility] why this is not showing in epic

## 2019-04-02 NOTE — Telephone Encounter (Signed)
Lm for [place] with [facility].

## 2019-04-02 NOTE — Telephone Encounter (Signed)
Spoke to [place] with [facility], who stated that she reached out to IT.  IT checked system and everything seems to be working correctly. IT recommended that Dr. Elsworth Soho log out and back in.  Sharyn Lull stated that Mr. Whidby sleep study was preformed with older system and will need to read with morpheus.  Dr. Elsworth Soho, please advise. Thanks

## 2019-04-03 ENCOUNTER — Other Ambulatory Visit: Payer: Self-pay

## 2019-04-03 ENCOUNTER — Telehealth: Payer: Self-pay | Admitting: Internal Medicine

## 2019-04-03 ENCOUNTER — Encounter: Payer: Self-pay | Admitting: Internal Medicine

## 2019-04-03 ENCOUNTER — Ambulatory Visit (INDEPENDENT_AMBULATORY_CARE_PROVIDER_SITE_OTHER): Payer: Medicaid Other | Admitting: Internal Medicine

## 2019-04-03 VITALS — BP 130/90 | HR 79 | Ht 73.0 in | Wt 245.5 lb

## 2019-04-03 DIAGNOSIS — D869 Sarcoidosis, unspecified: Secondary | ICD-10-CM | POA: Diagnosis not present

## 2019-04-03 DIAGNOSIS — I10 Essential (primary) hypertension: Secondary | ICD-10-CM

## 2019-04-03 DIAGNOSIS — I309 Acute pericarditis, unspecified: Secondary | ICD-10-CM | POA: Diagnosis not present

## 2019-04-03 DIAGNOSIS — Z01810 Encounter for preprocedural cardiovascular examination: Secondary | ICD-10-CM | POA: Diagnosis not present

## 2019-04-03 DIAGNOSIS — I300 Acute nonspecific idiopathic pericarditis: Secondary | ICD-10-CM | POA: Diagnosis not present

## 2019-04-03 MED ORDER — COLCHICINE 0.6 MG PO TABS: 0.6000 mg | ORAL_TABLET | Freq: Two times a day (BID) | ORAL | 6 refills | Status: DC

## 2019-04-03 NOTE — Telephone Encounter (Signed)
Please call regarding

## 2019-04-03 NOTE — Telephone Encounter (Signed)
Patient states he has went to the pharmacy and they are awaiting  "paperwork" in order to fill his Colchicine. Please call to discuss.

## 2019-04-03 NOTE — Progress Notes (Signed)
New Outpatient Visit Date: 04/03/2019  Referring Provider: Tyler Pita, MD [address] [place],  [place] [id number]  Chief Complaint: Chest pain  HPI:  Luis Mcknight is a 39 y.o. male who is being seen today for the evaluation of chest pain at the request of Tyler Pita, MD. He has a history of sarcoidosis, pericarditis, hypertension, arthritis, and GERD.  He presented to the ED in 03/2018 with sharp chest pain and was diagnosed with pericarditis.  He was placed on colchicine with some improvement, though it has returned with discontinuation of colchicine and de-escalation of prednisone that was started for sarcoidosis.  He notes over the last 2 to 3 weeks, he has been having sharp chest pain in the left lower chest that also involves the left arm.  Of note, the left arm pain seems to be positional and worsened by movement.  He also reports some shortness of breath as well as palpitations when trying to go to sleep.  He noted some lightheadedness while walking to the office today, though this is not typical.  He has occasional dependent edema and has also put on quite a bit of weight since being started on prednisone for sarcoidosis.  He denies a history of prior cardiac disease other than the aforementioned pericarditis.  --------------------------------------------------------------------------------------------------  Cardiovascular History & Procedures: Cardiovascular Problems:  Chest pain and sarcoidosis  Pericarditis  Risk Factors:  Hypertension, male gender, and obesity  Cath/PCI:  None  CV Surgery:  None  EP Procedures and Devices:  None  Non-Invasive Evaluation(s):  TTE (12/11/2018): Normal LV size and wall thickness.  LVEF 60-65% with normal diastolic function.  Normal RV size and function.  Trivial mitral, pulmonic, and tricuspid regurgitation.  Moderately dilated pulmonary artery.  Borderline dilation of the aortic root (3.8 cm).  Recent  CV Pertinent Labs: Lab Results  Component Value Date   BNP 4.0 12/21/2017   K 4.2 01/01/2019   BUN 16 01/01/2019   CREATININE 1.04 01/01/2019    --------------------------------------------------------------------------------------------------  Past Medical History:  Diagnosis Date  . Arthritis   . Depression    NO MEDS  . GERD (gastroesophageal reflux disease)    NO MEDS  . Hypertension   . Pneumonia    JUNE 2020    Past Surgical History:  Procedure Laterality Date  . ELECTROMAGNETIC NAVIGATION BROCHOSCOPY Left 10/08/2018   Procedure: ELECTROMAGNETIC NAVIGATION BRONCHOSCOPY, LEFT;  Surgeon: Tyler Pita, MD;  Location: [facility] ORS;  Service: Cardiopulmonary;  Laterality: Left;  . ENDOBRONCHIAL ULTRASOUND Left 10/08/2018   Procedure: ENDOBRONCHIAL ULTRASOUND, LEFT;  Surgeon: Tyler Pita, MD;  Location: [facility] ORS;  Service: Cardiopulmonary;  Laterality: Left;  . NO PAST SURGERIES      Current Meds  Medication Sig  . acetaminophen (TYLENOL) 500 MG tablet Take 500-1,000 mg by mouth every 6 (six) hours as needed.  Marland Kitchen amLODipine (NORVASC) 10 MG tablet Take 10 mg by mouth every morning.   . budesonide (PULMICORT) 0.5 MG/2ML nebulizer solution Take 2 mLs (0.5 mg total) by nebulization 2 (two) times daily. DX:D86.9  . fluticasone (FLONASE) 50 MCG/ACT nasal spray Place 1 spray into both nostrils 2 (two) times daily.  . predniSONE (DELTASONE) 10 MG tablet 40 mg (4 tabs) daily for 5 days, then 30 mg (3 tabs) daily for 5 days, then 20 mg (2 tabs) daily for 5 days, then 10 mg daily until seen in follow-up    Allergies: Latex  Social History   Tobacco Use  .  Smoking status: Current Every Day Smoker    Packs/day: 0.25    Years: 15.00    Pack years: 3.75    Types: Cigarettes  . Smokeless tobacco: Never Used  Substance Use Topics  . Alcohol use: Not Currently  . Drug use: No    Family History  Problem Relation Age of Onset  . Hypertension Mother   . Diabetes Mother   .  Diabetes Father   . Hypertension Father   . Heart attack Father 42    Review of Systems: A 12-system review of systems was performed and was negative except as noted in the HPI.  --------------------------------------------------------------------------------------------------  Physical Exam: BP 130/90 (BP Location: Right Arm, Patient Position: Sitting, Cuff Size: Large)   Pulse 79   Ht 6\' 1"  (1.854 m)   Wt 245 lb 8 oz (111.4 kg)   SpO2 96%   BMI 32.39 kg/m   General: NAD. HEENT: No conjunctival pallor or scleral icterus. Facemask in place. Neck: Supple without lymphadenopathy, thyromegaly, JVD, or HJR. No carotid bruit. Lungs: Normal work of breathing. Clear to auscultation bilaterally without wheezes or crackles. Heart: Distant heart sounds.  Regular rate and rhythm without murmurs, rubs, or gallops. Non-displaced PMI. Abd: Bowel sounds present. Soft, NT/ND without hepatosplenomegaly Ext: No lower extremity edema. Radial, PT, and DP pulses are 2+ bilaterally Skin: Warm and dry without rash. Neuro: CNIII-XII intact. Strength and fine-touch sensation intact in upper and lower extremities bilaterally. Psych: Normal mood and affect.  EKG: Normal sinus rhythm with nonspecific T wave abnormality.  Lab Results  Component Value Date   WBC 7.1 01/01/2019   HGB 14.7 01/01/2019   HCT 45.7 01/01/2019   MCV 80.3 01/01/2019   PLT 237 01/01/2019    Lab Results  Component Value Date   NA 141 01/01/2019   K 4.2 01/01/2019   CL 101 01/01/2019   CO2 30 01/01/2019   BUN 16 01/01/2019   CREATININE 1.04 01/01/2019   GLUCOSE 102 (H) 01/01/2019   ALT 52 (H) 01/01/2019    No results found for: CHOL, HDL, LDLCALC, LDLDIRECT, TRIG, CHOLHDL   --------------------------------------------------------------------------------------------------  ASSESSMENT AND PLAN: Pericarditis: I suspect recent recrudescent of pleuritic chest pain 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  My suspicion for  ischemic heart disease is low, given young age.  Given response to colchicine, we will restart colchicine 0.6 mg twice daily.  He remains on prednisone for sarcoidosis.  I am reluctant to place him on an NSAID in the setting of chronic steroid use and risk for gastritis/ulcers.  Given concern for cardiac involvement with sarcoidosis and recurrent pericarditis, we will proceed with a cardiac MRI for further evaluation.  Sarcoidosis: Currently on prednisone 10 mg daily with some shortness of breath.  As noted above, we will proceed with a cardiac MRI to evaluate for cardiac involvement.  If this is unrevealing and concern for cardiac involvement persists, myocardial PET/CT could be considered down the road.  Hypertension: Blood pressure borderline elevated today.  I will defer medication changes at this time.  Follow-up: Return to clinic in 6 weeks.  Nelva Bush, MD 04/03/2019 10:14 AM

## 2019-04-03 NOTE — Patient Instructions (Signed)
Medication Instructions:  Your physician has recommended you make the following change in your medication:  1- START Colchicine 0.6 mg (1 tablet) by mouth two times a day.  *If you need a refill on your cardiac medications before your next appointment, please call your pharmacy*  Lab Work: Your physician recommends that you return for lab work in: [place].  If you have labs (blood work) drawn today and your tests are completely normal, you will receive your results only by: Marland Kitchen MyChart Message (if you have MyChart) OR . A paper copy in the mail If you have any lab test that is abnormal or we need to change your treatment, we will call you to review the results.  Testing/Procedures: Someone will call to schedule - Your physician has requested that you have a cardiac MRI. Cardiac MRI uses a computer to create images of your heart as its beating, producing both still and moving pictures of your heart and major blood vessels. For further information please visit [email or web address] Please follow the instruction sheet given to you today for more information.   Follow-Up: At [facility], you and your health needs are our priority.  As part of our continuing mission to provide you with exceptional heart care, we have created designated Provider Care Teams.  These Care Teams include your primary Cardiologist (physician) and Advanced Practice Providers (APPs -  Physician Assistants and Nurse Practitioners) who all work together to provide you with the care you need, when you need it.  Your next appointment:   6 week(s)  The format for your next appointment:   In Person  Provider:    You may see DR Harrell Gave END or one of the following Advanced Practice Providers on your designated Care Team:    Murray Hodgkins, NP  Christell Faith, PA-C  Marrianne Mood, PA-C   Magnetic Resonance Imaging Magnetic resonance imaging (MRI) is a painless test that takes pictures of the inside of your  body. This test uses a strong magnet. This test does not use X-rays. What happens before the procedure?  You will be asked about any metal you may have in your body. This includes: ? Any joint replacement (prosthesis), such as an artificial knee or hip. ? Any implanted devices or ports. ? A metallic ear implant (cochlear implant). ? An artificial heart valve. ? A metallic object in the eye. ? Metal splinters. ? Bullet fragments. ? Any tattoos. Some of the darker inks can cause problems with testing.  You will be asked to take off all metal. This includes: ? Your watch, jewelry, and other metal objects. ? Hearing aids. ? Dentures. ? Underwire bra. ? Makeup. Some kinds of makeup contain small amounts of metal. ? Braces and fillings normally are not a problem.  If you are breastfeeding, ask your doctor if you need to pump before your test and then stop breastfeeding for a short time. You may need to do this if dye (contrast material) will be used during your MRI.  If you have a fear of cramped spaces (claustrophobia), you may be given medicines prior to the MRI. What happens during the procedure?   You may be given earplugs or headphones to listen to music. The MRI machine can be noisy.  You will lie down on a platform. It looks like a long table.  If dye will be used, an IV tube will be placed into one of your veins. Dye will be given through your IV tube at a  certain time as images are taken.  The platform will slide into a tunnel. The tunnel has magnets inside of it. When you are inside the tunnel, you will still be able to talk to your doctor.  The tunnel will scan your body and make images. You will be asked to lie very still. Your doctor will tell you when you can move. You may have to wait a few minutes to make sure that the images from the test are clear.  When all images are taken, the platform will slide out of the tunnel. The procedure may vary among doctors and  hospitals. What happens after the procedure?  You may be taken to a recovery area if sedation medicines were used. Your blood pressure, heart rate, breathing rate, and blood oxygen level will be monitored until you leave the hospital or clinic.  If dye was used: ? It will leave your body through your pee (urine). It takes about a day for all of the dye to leave the body. ? You may be told to drink plenty of fluids. This helps your body get rid of the dye. ? If you are breastfeeding, do not breastfeed your child until your doctor says that this is safe.  You may go back to your normal activities right away, or as told by your doctor.  It is up to you to get your test results. Ask your doctor, or the department that is doing the test, when your results will be ready. Summary  Magnetic resonance imaging (MRI) is a test that takes pictures of the inside of your body.  Before your MRI, be sure to tell your doctor about any metal you may have in your body.  You may go back to your normal activities right away, or as told by your doctor. This information is not intended to replace advice given to you by your health care provider. Make sure you discuss any questions you have with your health care provider. Document Revised: 01/16/2017 Document Reviewed: 01/16/2017 Elsevier Patient Education  2020 [employer].

## 2019-04-04 ENCOUNTER — Telehealth: Payer: Self-pay | Admitting: Internal Medicine

## 2019-04-04 LAB — BASIC METABOLIC PANEL
BUN/Creatinine Ratio: 16 (ref 9–20)
BUN: 16 mg/dL (ref 6–20)
CO2: 23 mmol/L (ref 20–29)
Calcium: 9.5 mg/dL (ref 8.7–10.2)
Chloride: 103 mmol/L (ref 96–106)
Creatinine, Ser: 0.98 mg/dL (ref 0.76–1.27)
GFR calc Af Amer: 114 mL/min/{1.73_m2} (ref >59)
GFR calc non Af Amer: 99 mL/min/{1.73_m2} (ref >59)
Glucose: 95 mg/dL (ref 65–99)
Potassium: 4.4 mmol/L (ref 3.5–5.2)
Sodium: 141 mmol/L (ref 134–144)

## 2019-04-04 NOTE — Telephone Encounter (Signed)
The study was the last one read in [place] on 12/23 -Showed severe OSA with AHI 34/hour Needs formal CPAP titration or alternatively auto CPAP -5 to 20 cm, mask of choice Defer to referring physician  -Sleep med has now changed to a different system and perhaps that is why this was not transcribed in epic, they are looking into this.  If required I can print out interpretation from morpheus system and sent to DME

## 2019-04-04 NOTE — Telephone Encounter (Signed)
Pt is aware of results.  cpap titration has been ordered, as pt was agreeable with this.  Nothing further is needed.

## 2019-04-04 NOTE — Telephone Encounter (Signed)
Received forms from Disability Determination services to be completed  Placed in interoffice mail and sent to [facility]

## 2019-04-04 NOTE — Telephone Encounter (Signed)
On hold with [place] [employer] for 15 minutes-will try again later.

## 2019-04-04 NOTE — Telephone Encounter (Signed)
Lets do a CPAP titration study

## 2019-04-04 NOTE — Progress Notes (Signed)
Thank you Dr. Saunders Revel!

## 2019-04-04 NOTE — Telephone Encounter (Signed)
Dr. Patsey Berthold, please see below results and advise. Thanks

## 2019-04-04 NOTE — Telephone Encounter (Signed)
Per Sarajane Jews, pharmacist at [facility], patient has been using a discount card and paying out of pocket for colchicine. He picked up 20 tablets yesterday. Will initiate PA through [place] at [phone]. Patient ET:[id number] S

## 2019-04-05 NOTE — Telephone Encounter (Signed)
Called  [employer] and spoke with Joelene Millin to initiate a prior authorization for colchicine 0.6mg  tablets BID. She states the requestt has been forwarded for review and the turnaround time is 24 hours. She also notes our office will not receive a call or fax after determination to let us know whether or not it has been approved or denied. She states we will have to call back or go on their website: [email or web address] and register under the getting started link to check the PA status. PA# [id number] Call Ref# [id number]

## 2019-04-08 ENCOUNTER — Telehealth: Payer: Self-pay | Admitting: Internal Medicine

## 2019-04-08 NOTE — Telephone Encounter (Signed)
Error

## 2019-04-08 NOTE — Telephone Encounter (Signed)
Patient calling to check on status of prior auth Please advise

## 2019-04-09 NOTE — Telephone Encounter (Signed)
Per [place] [facility], PA has been denied. Confirmation# [id number] Patient must try mitigare 0.6mg  capsules-BRAND ONLY, probenecid tablets, or probenecid colchicine tablets. Please advise. Thank you!

## 2019-04-10 MED ORDER — COLCHICINE 0.6 MG PO CAPS: 1.0000 | ORAL_CAPSULE | Freq: Two times a day (BID) | ORAL | 3 refills | Status: DC

## 2019-04-10 NOTE — Telephone Encounter (Signed)
If brand name Mitigare is what is preferred, I am fine with tha.  Nelva Bush, MD [facility]

## 2019-04-10 NOTE — Telephone Encounter (Signed)
Prescription for Mitigare sent to pharmacy.  Called pharmacy and they were able to run the medication through insurance. It should cost patient about $6.   They will notify patient when it is ready.  Patient notified and is appreciative.

## 2019-04-15 ENCOUNTER — Encounter: Payer: Self-pay | Admitting: *Deleted

## 2019-04-15 ENCOUNTER — Telehealth: Payer: Self-pay | Admitting: *Deleted

## 2019-04-15 ENCOUNTER — Telehealth: Payer: Self-pay | Admitting: Pulmonary Disease

## 2019-04-15 NOTE — Telephone Encounter (Signed)
Spoke with patient regarding appointment for Cardiac MRI scheduled 04/29/19 at 12:00pm at [facility]---arrival tim 11:15 am at the 1st floor admissions office for check in--will mail information to patient and it is also in My Chart

## 2019-04-15 NOTE — Telephone Encounter (Signed)
Pt is aware of date/time of covid test and voiced his understanding.  04/17/19 prior to 11a [facility]

## 2019-04-17 ENCOUNTER — Other Ambulatory Visit
Admission: RE | Admit: 2019-04-17 | Discharge: 2019-04-17 | Disposition: A | Discharge status: Home / Self Care | Payer: Medicaid Other | Source: Ambulatory Visit | Attending: Internal Medicine | Admitting: Internal Medicine

## 2019-04-17 DIAGNOSIS — Z01812 Encounter for preprocedural laboratory examination: Secondary | ICD-10-CM | POA: Diagnosis present

## 2019-04-17 DIAGNOSIS — Z20822 Contact with and (suspected) exposure to covid-19: Secondary | ICD-10-CM | POA: Diagnosis not present

## 2019-04-17 DIAGNOSIS — Z01810 Encounter for preprocedural cardiovascular examination: Secondary | ICD-10-CM | POA: Diagnosis not present

## 2019-04-17 LAB — SARS CORONAVIRUS 2 (TAT 6-24 HRS): SARS Coronavirus 2: NEGATIVE

## 2019-04-19 ENCOUNTER — Encounter: Payer: Self-pay | Admitting: Pulmonary Disease

## 2019-04-19 ENCOUNTER — Ambulatory Visit: Payer: Medicaid Other | Attending: Pulmonary Disease

## 2019-04-19 DIAGNOSIS — G4733 Obstructive sleep apnea (adult) (pediatric): Secondary | ICD-10-CM | POA: Diagnosis not present

## 2019-04-19 DIAGNOSIS — D869 Sarcoidosis, unspecified: Secondary | ICD-10-CM | POA: Diagnosis not present

## 2019-04-19 DIAGNOSIS — I10 Essential (primary) hypertension: Secondary | ICD-10-CM | POA: Insufficient documentation

## 2019-04-19 DIAGNOSIS — I309 Acute pericarditis, unspecified: Secondary | ICD-10-CM | POA: Insufficient documentation

## 2019-04-20 ENCOUNTER — Other Ambulatory Visit: Payer: Self-pay

## 2019-04-24 ENCOUNTER — Ambulatory Visit (INDEPENDENT_AMBULATORY_CARE_PROVIDER_SITE_OTHER): Payer: Medicaid Other | Admitting: Pulmonary Disease

## 2019-04-24 ENCOUNTER — Encounter: Payer: Self-pay | Admitting: Pulmonary Disease

## 2019-04-24 DIAGNOSIS — D869 Sarcoidosis, unspecified: Secondary | ICD-10-CM

## 2019-04-24 DIAGNOSIS — J310 Chronic rhinitis: Secondary | ICD-10-CM

## 2019-04-24 DIAGNOSIS — G4733 Obstructive sleep apnea (adult) (pediatric): Secondary | ICD-10-CM | POA: Diagnosis not present

## 2019-04-24 NOTE — Patient Instructions (Signed)
We are awaiting the results of your titration study.  As soon as we know we will order your equipment.  I will also follow up on the results of your cardiac MRI.  Continue prednisone at 10 mg daily.  We will see you in follow-up in 4 to 6 weeks time.  Call sooner should any new problems arise.

## 2019-04-24 NOTE — Progress Notes (Signed)
   Subjective:    Patient ID: Luis Mcknight, male    DOB: 07/15/1980, 39 y.o.   MRN: [id number] Virtual Visit Via Video or Telephone Note:   This visit type was conducted due to national recommendations for restrictions regarding the COVID-19 pandemic .  This format is felt to be most appropriate for this patient at this time.  All issues noted in this document were discussed and addressed.  No physical exam was performed (except for noted visual exam findings with Video Visits).    I connected with Luis Mcknight by telephone at 2 PM and verified that I was speaking with the correct person using two identifiers. Location patient: home Location provider: [facility] Persons participating in the virtual visit: patient, physician   I discussed the limitations, risks, security and privacy concerns of performing an evaluation and management service by video and the availability of in person appointments. The patient expressed understanding and agreed to proceed.  HPI Patient is a 39 year old current smoker (one quarter of a pack per day) with sarcoidosis.  Follow-up today is being done via telephone due to rescheduling necessary for inclement weather.  Patient is following up on sleep titration study and other issues discussed on the most recent visit of 14 January.  He had a sleep titration study for severe obstructive sleep apnea on 22 April 2019.  The results of the study are not available yet.  It was conveyed to the patient.  She is scheduled for an MRI of the heart on 29 April 2019 we will also follow-up on those issues.  Patient has been taking 10 mg a day for his sarcoidosis and currently is doing well on this.  He has issues with chronic nasal congestion also improved with Flonase that was prescribed at his last visit.  He has had no fevers, chills or sweats.  Voices no new complaints.  Review of Systems A 10 point review of systems was performed and it is as noted  above otherwise negative.    Objective:   Physical Exam  No physical exam performed as the visit was via telephone.  Patient did not exhibit conversational dyspnea during the visit.      Assessment & Plan:   Sarcoidosis Last imaging on 14 January 2019 showed improvement Continue 10 mg prednisone daily   Obstructive of sleep apnea, severe He had titration study, results not available Once results available will order appropriate equipment  Perennial nonallergic rhinitis Continue Flonase  We will follow up with the patient in 4 to 6 weeks time.  He is to call sooner should any new difficulties arise.   Total visit time 12 minutes  C. Derrill Kay, MD [facility]  *This note was dictated using voice recognition software/Dragon.  Despite best efforts to proofread, errors can occur which can change the meaning.  Any change was purely unintentional.

## 2019-04-25 ENCOUNTER — Ambulatory Visit: Payer: Medicaid Other | Admitting: Pulmonary Disease

## 2019-04-26 ENCOUNTER — Encounter (HOSPITAL_COMMUNITY): Payer: Self-pay

## 2019-04-26 ENCOUNTER — Telehealth (HOSPITAL_COMMUNITY): Payer: Self-pay | Admitting: Emergency Medicine

## 2019-04-26 NOTE — Telephone Encounter (Signed)
Reaching out to patient to offer assistance regarding upcoming cardiac imaging study; pt verbalizes understanding of appt date/time, parking situation and where to check in, and verified current allergies; name and call back number provided for further questions should they arise Marchia Bond RN Navigator Cardiac Imaging [facility] [phone] office [phone] cell  Pt denies diabetes or use of diabetes supplies, denies implanted devices/shrapnel, denies claustrophobia  Asked if we were also scanning his head, explained unfortunately not, that a separate order would be needed for that exam

## 2019-04-29 ENCOUNTER — Other Ambulatory Visit: Payer: Self-pay

## 2019-04-29 ENCOUNTER — Ambulatory Visit (HOSPITAL_COMMUNITY)
Admission: RE | Admit: 2019-04-29 | Discharge: 2019-04-29 | Disposition: A | Discharge status: Home / Self Care | Payer: Medicaid Other | Source: Ambulatory Visit | Attending: Internal Medicine | Admitting: Internal Medicine

## 2019-04-29 DIAGNOSIS — D869 Sarcoidosis, unspecified: Secondary | ICD-10-CM | POA: Diagnosis not present

## 2019-04-29 DIAGNOSIS — I429 Cardiomyopathy, unspecified: Secondary | ICD-10-CM | POA: Diagnosis not present

## 2019-04-29 MED ORDER — GADOBUTROL 1 MMOL/ML IV SOLN
10.0000 mL | Freq: Once | INTRAVENOUS | Status: AC | PRN
  Administered 2019-04-29: 14 mL via INTRAVENOUS

## 2019-04-30 ENCOUNTER — Telehealth: Payer: Self-pay | Admitting: Pulmonary Disease

## 2019-04-30 DIAGNOSIS — G4733 Obstructive sleep apnea (adult) (pediatric): Secondary | ICD-10-CM

## 2019-04-30 NOTE — Telephone Encounter (Signed)
cpap titration has been reviewed by Dr. Elsworth Soho. Below are recommendations. 1. cpap pressure of 11cm h2O with medium nasal pillows 2. Avoid bedtime alcohol and sedatives 4. Follow up with referring provider 5. Until problems with somnolence resolve, be careful to avoid situations in which daytime sleepiness would present a hazard.   Pt is aware of results and voiced his understanding. Order has been placed for cpap, as pt wishes to proceed.  Pt has pending OV with [facility] on 05/30/2019. Nothing further is needed.

## 2019-05-09 DIAGNOSIS — G4733 Obstructive sleep apnea (adult) (pediatric): Secondary | ICD-10-CM | POA: Diagnosis not present

## 2019-05-14 NOTE — Progress Notes (Deleted)
   Follow-up Outpatient Visit Date: 05/15/2019  Primary Care Provider: Davis, Diane Marie, PA-C [address] [place] [address]  Chief Complaint: ***  HPI:  Luis Mcknight is a 38 y.o. male with history of sarcoidosis, pericarditis, hypertension, arthritis, and GERD, who presents for follow-up of pericarditis.  I met him in late January, at which time he reported recurrent chest pain consistent with prior pericarditis.  We agreed to restart colchicine.  We also obtained a cardiac MRI, which showed normal biventricular function without evidence of myocardial infiltration/scar.  --------------------------------------------------------------------------------------------------  Cardiovascular History & Procedures: Cardiovascular Problems:  Chest pain and sarcoidosis  Pericarditis  Risk Factors:  Hypertension, male gender, and obesity  Cath/PCI:  None  CV Surgery:  None  EP Procedures and Devices:  None  Non-Invasive Evaluation(s):  Cardiac MRI (04/29/2019): Normal LV size and function (LVEF 70%).  Normal RV size and function.  Trivial MR and AI.  No pericardial effusion.  No evidence of scar, infiltration, or cardiac sarcoid.  TTE (12/11/2018): Normal LV size and wall thickness.  LVEF 60-65% with normal diastolic function.  Normal RV size and function.  Trivial mitral, pulmonic, and tricuspid regurgitation.  Moderately dilated pulmonary artery.  Borderline dilation of the aortic root (3.8 cm).  Recent CV Pertinent Labs: Lab Results  Component Value Date   BNP 4.0 12/21/2017   K 4.4 04/03/2019   BUN 16 04/03/2019   CREATININE 0.98 04/03/2019    Past medical and surgical history were reviewed and updated in EPIC.  No outpatient medications have been marked as taking for the 05/15/19 encounter (Appointment) with Mcknight, Christopher, MD.    Allergies: Latex  Social History   Tobacco Use  . Smoking status: Current Every Day Smoker    Packs/day: 0.25   Years: 15.00    Pack years: 3.75    Types: Cigarettes  . Smokeless tobacco: Never Used  Substance Use Topics  . Alcohol use: Not Currently  . Drug use: No    Family History  Problem Relation Age of Onset  . Hypertension Mother   . Diabetes Mother   . Diabetes Father   . Hypertension Father   . Heart attack Father 63    Review of Systems: A 12-system review of systems was performed and was negative except as noted in the HPI.  --------------------------------------------------------------------------------------------------  Physical Exam: There were no vitals taken for this visit.  General:  *** HEENT: No conjunctival pallor or scleral icterus. Facemask in place. Neck: Supple without lymphadenopathy, thyromegaly, JVD, or HJR. Lungs: Normal work of breathing. Clear to auscultation bilaterally without wheezes or crackles. Heart: Regular rate and rhythm without murmurs, rubs, or gallops. Non-displaced PMI. Abd: Bowel sounds present. Soft, NT/ND without hepatosplenomegaly Ext: No lower extremity edema. Radial, PT, and DP pulses are 2+ bilaterally. Skin: Warm and dry without rash.  EKG:  ***  Lab Results  Component Value Date   WBC 7.1 01/01/2019   HGB 14.7 01/01/2019   HCT 45.7 01/01/2019   MCV 80.3 01/01/2019   PLT 237 01/01/2019    Lab Results  Component Value Date   NA 141 04/03/2019   K 4.4 04/03/2019   CL 103 04/03/2019   CO2 23 04/03/2019   BUN 16 04/03/2019   CREATININE 0.98 04/03/2019   GLUCOSE 95 04/03/2019   ALT 52 (H) 01/01/2019    No results found for: CHOL, HDL, LDLCALC, LDLDIRECT, TRIG, CHOLHDL  --------------------------------------------------------------------------------------------------  ASSESSMENT AND PLAN: ***  Luis End, MD 05/14/2019 7:07 PM  

## 2019-05-15 ENCOUNTER — Ambulatory Visit: Payer: Medicaid Other | Admitting: Internal Medicine

## 2019-05-15 ENCOUNTER — Telehealth: Payer: Self-pay | Admitting: Internal Medicine

## 2019-05-15 NOTE — Telephone Encounter (Signed)
Virtual Visit Pre-Appointment Phone Call  "(Name), I am calling you today to discuss your upcoming appointment. We are currently trying to limit exposure to the virus that causes COVID-19 by seeing patients at home rather than in the office."  1. "What is the BEST phone number to call the day of the visit?" - include this in appointment notes  2. "Do you have or have access to (through a family member/friend) a smartphone with video capability that we can use for your visit?" a. If yes - list this number in appt notes as "cell" (if different from BEST phone #) and list the appointment type as a VIDEO visit in appointment notes b. If no - list the appointment type as a PHONE visit in appointment notes  3. Confirm consent - "In the setting of the current Covid19 crisis, you are scheduled for a (phone or video) visit with your provider on (date) at (time).  Just as we do with many in-office visits, in order for you to participate in this visit, we must obtain consent.  If you'd like, I can send this to your mychart (if signed up) or email for you to review.  Otherwise, I can obtain your verbal consent now.  All virtual visits are billed to your insurance company just like a normal visit would be.  By agreeing to a virtual visit, we'd like you to understand that the technology does not allow for your provider to perform an examination, and thus may limit your provider's ability to fully assess your condition. If your provider identifies any concerns that need to be evaluated in person, we will make arrangements to do so.  Finally, though the technology is pretty good, we cannot assure that it will always work on either your or our end, and in the setting of a video visit, we may have to convert it to a phone-only visit.  In either situation, we cannot ensure that we have a secure connection.  Are you willing to proceed?" STAFF: Did the patient verbally acknowledge consent to telehealth visit? Document  YES/NO here: YES  4. Advise patient to be prepared - "Two hours prior to your appointment, go ahead and check your blood pressure, pulse, oxygen saturation, and your weight (if you have the equipment to check those) and write them all down. When your visit starts, your provider will ask you for this information. If you have an Apple Watch or Kardia device, please plan to have heart rate information ready on the day of your appointment. Please have a pen and paper handy nearby the day of the visit as well."  5. Give patient instructions for MyChart download to smartphone OR [email or web address].me as below if video visit (depending on what platform provider is using)  6. Inform patient they will receive a phone call 15 minutes prior to their appointment time (may be from unknown caller ID) so they should be prepared to answer    TELEPHONE CALL NOTE  Derrek Cavness has been deemed a candidate for a follow-up tele-health visit to limit community exposure during the Covid-19 pandemic. I spoke with the patient via phone to ensure availability of phone/video source, confirm preferred email & phone number, and discuss instructions and expectations.  I reminded Myzel Tillis to be prepared with any vital sign and/or heart rhythm information that could potentially be obtained via home monitoring, at the time of his visit. I reminded Javid Imperatore to expect a phone call prior to his visit.  Saunders Glance Bumgarner 05/15/2019 8:05 AM   INSTRUCTIONS FOR DOWNLOADING THE MYCHART APP TO SMARTPHONE  - The patient must first make sure to have activated MyChart and know their login information - If Apple, go to [employer] and type in MyChart in the search bar and download the app. If Android, ask patient to go to [employer] and type in [place] in the search bar and download the app. The app is free but as with any other app downloads, their phone may require them to verify saved payment information or Apple/Android  password.  - The patient will need to then log into the app with their MyChart username and password, and select [facility] as their healthcare provider to link the account. When it is time for your visit, go to the MyChart app, find appointments, and click Begin Video Visit. Be sure to Select Allow for your device to access the Microphone and Camera for your visit. You will then be connected, and your provider will be with you shortly.  **If they have any issues connecting, or need assistance please contact MyChart service desk [phone])**  **If using a computer, in order to ensure the best quality for their visit they will need to use either of the following Internet Browsers: [employer], or Google Chrome**  IF USING DOXIMITY or DOXY.ME - The patient will receive a link just prior to their visit by text.     FULL LENGTH CONSENT FOR TELE-HEALTH VISIT   I hereby voluntarily request, consent and authorize [facility] and its employed or contracted physicians, physician assistants, nurse practitioners or other licensed health care professionals (the Practitioner), to provide me with telemedicine health care services (the "Services") as deemed necessary by the treating Practitioner. I acknowledge and consent to receive the Services by the Practitioner via telemedicine. I understand that the telemedicine visit will involve communicating with the Practitioner through live audiovisual communication technology and the disclosure of certain medical information by electronic transmission. I acknowledge that I have been given the opportunity to request an in-person assessment or other available alternative prior to the telemedicine visit and am voluntarily participating in the telemedicine visit.  I understand that I have the right to withhold or withdraw my consent to the use of telemedicine in the course of my care at any time, without affecting my right to future care or treatment,  and that the Practitioner or I may terminate the telemedicine visit at any time. I understand that I have the right to inspect all information obtained and/or recorded in the course of the telemedicine visit and may receive copies of available information for a reasonable fee.  I understand that some of the potential risks of receiving the Services via telemedicine include:  Marland Kitchen Delay or interruption in medical evaluation due to technological equipment failure or disruption; . Information transmitted may not be sufficient (e.g. poor resolution of images) to allow for appropriate medical decision making by the Practitioner; and/or  . In rare instances, security protocols could fail, causing a breach of personal health information.  Furthermore, I acknowledge that it is my responsibility to provide information about my medical history, conditions and care that is complete and accurate to the best of my ability. I acknowledge that Practitioner's advice, recommendations, and/or decision may be based on factors not within their control, such as incomplete or inaccurate data provided by me or distortions of diagnostic images or specimens that may result from electronic transmissions. I understand that the  practice of medicine is not an Chief Strategy Officer and that Practitioner makes no warranties or guarantees regarding treatment outcomes. I acknowledge that I will receive a copy of this consent concurrently upon execution via email to the email address I last provided but may also request a printed copy by calling the office of [facility].    I understand that my insurance will be billed for this visit.   I have read or had this consent read to me. . I understand the contents of this consent, which adequately explains the benefits and risks of the Services being provided via telemedicine.  . I have been provided ample opportunity to ask questions regarding this consent and the Services and have had my questions  answered to my satisfaction. . I give my informed consent for the services to be provided through the use of telemedicine in my medical care  By participating in this telemedicine visit I agree to the above.

## 2019-05-19 NOTE — Progress Notes (Addendum)
Virtual Visit via Telephone Note   This visit type was conducted due to national recommendations for restrictions regarding the COVID-19 Pandemic (e.g. social distancing) in an effort to limit this patient's exposure and mitigate transmission in our community.  Due to his co-morbid illnesses, this patient is at least at moderate risk for complications without adequate follow up.  This format is felt to be most appropriate for this patient at this time.  The patient did not have access to video technology/had technical difficulties with video requiring transitioning to audio format only (telephone).  All issues noted in this document were discussed and addressed.  No physical exam could be performed with this format.  Please refer to the patient's chart for his  consent to telehealth for [facility].   The patient was identified using 2 identifiers.  Date:  05/20/2019   ID:  Luis Mcknight, DOB 1980/07/04, MRN [id number]  Patient Location: Home Provider Location: Office  PCP:  Ferrel Logan, PA-C  Cardiologist:  No primary care provider on file.    Electrophysiologist:  None   Evaluation Performed:  Follow-Up Visit  Chief Complaint:  CP, Pericarditis, HTN  History of Present Illness:    Luis Mcknight is a 39 y.o. male last seen by Dr. Saunders Revel for complaint of chest pain April 03, 2019.  History of sarcoidosis, pericarditis, hypertension, arthritis, GERD, obesity.  Presented to emergency room January 2020 with sharp chest pain and diagnosed with pericarditis.  Placed on colchicine with some improvement.  It returned after discontinuation of colchicine and tapering of prednisone that was started for sarcoidosis.  On April 03, 2019 visit he complained of having sharp chest pain in the left lower chest involving left arm over the prior 2-3 weeks before presentation.  He also complained of shortness of breath and palpitations. He had subsequent cardiac MRI  04/29/2019 showing no  evidence of cardiac sarcoid. He was started on Mitigare 0.6 mg po bid.  Dr End suggested If MRI unrevealing and concern for cardiac involvement persists, myocardial PET/CT could be considered down the road. At the last visit starting NSAID therapy was thought not to be appropriate as it may cause gastric issues in the setting of current steroid therapy.  Recent f/u with pulmonology for sarcoid improved. Had sleep study and CPAP adjusted  Patient denies any recent significant anginal or exertional symptoms other than occasional transient chest pain not associated with exertion.  States he has chronic shortness of breath but not out of proportion to the usual shortness of breath that he experiences on a daily basis.  States his blood pressure has been doing fairly well except for the diastolic staying somewhere in the 90s.  States he does have some joint aches.  He is taking his Mitigare 0.6 mg p.o. twice daily.  States he has enough for the next 3 months.  He continues to take prednisone prescribed by pulmonary for his sarcoidosis.  The patient does not have symptoms concerning for COVID-19 infection (fever, chills, cough, or new shortness of breath).    Past Medical History:  Diagnosis Date  . Arthritis   . Depression    NO MEDS  . GERD (gastroesophageal reflux disease)    NO MEDS  . Hypertension   . Pneumonia    JUNE 2020   Past Surgical History:  Procedure Laterality Date  . ELECTROMAGNETIC NAVIGATION BROCHOSCOPY Left 10/08/2018   Procedure: ELECTROMAGNETIC NAVIGATION BRONCHOSCOPY, LEFT;  Surgeon: Tyler Pita, MD;  Location: [facility] ORS;  Service:  Cardiopulmonary;  Laterality: Left;  . ENDOBRONCHIAL ULTRASOUND Left 10/08/2018   Procedure: ENDOBRONCHIAL ULTRASOUND, LEFT;  Surgeon: Tyler Pita, MD;  Location: [facility] ORS;  Service: Cardiopulmonary;  Laterality: Left;  . NO PAST SURGERIES       Current Meds  Medication Sig  . acetaminophen (TYLENOL) 500 MG tablet Take 500-1,000 mg  by mouth every 6 (six) hours as needed.  Marland Kitchen amLODipine (NORVASC) 10 MG tablet Take 10 mg by mouth every morning.   . Colchicine (MITIGARE) 0.6 MG CAPS Take 1 capsule by mouth 2 (two) times daily.  . fluticasone (FLONASE) 50 MCG/ACT nasal spray Place 1 spray into both nostrils 2 (two) times daily.  . predniSONE (DELTASONE) 10 MG tablet 40 mg (4 tabs) daily for 5 days, then 30 mg (3 tabs) daily for 5 days, then 20 mg (2 tabs) daily for 5 days, then 10 mg daily until seen in follow-up     Allergies:   Latex   Social History   Tobacco Use  . Smoking status: Current Every Day Smoker    Packs/day: 0.25    Years: 15.00    Pack years: 3.75    Types: Cigarettes  . Smokeless tobacco: Never Used  Substance Use Topics  . Alcohol use: Not Currently  . Drug use: No     Family Hx: The patient's family history includes Diabetes in his father and mother; Heart attack (age of onset: 33) in his father; Hypertension in his father and mother.  ROS:   Please see the history of present illness.    All other systems reviewed and are negative.   Prior CV studies:   The following studies were reviewed today:  Echocardiogram 12/11/2018  1. Left ventricular ejection fraction, by visual estimation, is 60 to 65%. The left ventricle has normal function. Normal left ventricular size. There is no left ventricular hypertrophy. 2. Global right ventricle has normal systolic function.The right ventricular size is normal. No increase in right ventricular wall thickness. 3. Left atrial size was normal. 4. Right atrial size was normal. 5. The pericardium was not well visualized. 6. The mitral valve is normal in structure. Trace mitral valve regurgitation. 7. The tricuspid valve is normal in structure. Tricuspid valve regurgitation is trivial. 8. The aortic valve is tricuspid Aortic valve regurgitation was not visualized by color flow Doppler. Structurally normal aortic valve, with no evidence of sclerosis or  stenosis. 9. The pulmonic valve was grossly normal. Pulmonic valve regurgitation is trivial by color flow Doppler. 10. Aortic dilatation noted. 11. There is mild dilatation of the aortic root measuring 38 mm. 12. Moderately dilated pulmonary artery. 13. TR signal is inadequate for assessing pulmonary artery systolic pressure. 14. The interatrial septum was not well visualized.  Cardiac MRI 2.22.2021 FINDINGS: Normal cardiac chamber sizes. No pericardial effusion. Normal aortic root 3.4 cm. Normal TV,MV, AV. Trivial appearing AR and MR. No ASD/PFO/VSD. Normal LV size and function. Quantitative EF 70% (EDV 135 cc ESV 40 cc SV 95 cc) Normal RV size and function. Delayed inversion recovery sequences with gadolinium showed no uptake, infarct infiltration or evidence of sarcoid IMPRESSION: 1.  Normal LV size and function EF 70% 2. Normal post gadolinium images with no evidence scar, infiltration or sarcoid 3.  Normal RV size and function 4.  Trivial MR 5.  Trivial AR 6.  No pericardial effusion  Labs/Other Tests and Data Reviewed:    EKG:  None today  Recent Labs: 01/01/2019: ALT 52; Hemoglobin 14.7; Platelets 237 04/03/2019: BUN  16; Creatinine, Ser 0.98; Potassium 4.4; Sodium 141   Recent Lipid Panel No results found for: CHOL, TRIG, HDL, CHOLHDL, LDLCALC, LDLDIRECT  Wt Readings from Last 3 Encounters:  05/20/19 255 lb (115.7 kg)  04/03/19 245 lb 8 oz (111.4 kg)  01/28/19 245 lb 6.4 oz (111.3 kg)     Objective:    Vital Signs:  Ht 6\' 1"  (1.854 m)   Wt 255 lb (115.7 kg)   BMI 33.64 kg/m    Speech pattern is normal and responding appropriately to questions.  No evidence of dyspnea, cough or wheezing during telemedicine visit.  ASSESSMENT & PLAN:    1. Essential hypertension Patient has no blood pressures recorded today.  States his diastolic has been runningSomewhere in the 90s.  Last recorded blood pressure on 04/03/2019 was 130/90.  Continue amlodipine 10 mg  daily.  2. Sarcoidosis Had cardiac MRI 04/29/2019 with no evidence of cardiac sarcoid. 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  3. History of pericarditis Currently on Mitigare 0.6 mg po bid (for 3 months). Not on NSAID therapy d/t possible gastric issues if given current steroid therapy for sarcoidosis.  Continue Mitigare for at least 3 months.  COVID-19 Education: The signs and symptoms of COVID-19 were discussed with the patient and how to seek care for testing (follow up with PCP or arrange E-visit).  The importance of social distancing was discussed today.  Time:   Today, I have spent 15  minutes with the patient with telehealth technology discussing the above problems.     Medication Adjustments/Labs and Tests Ordered: Current medicines are reviewed at length with the patient today.  Concerns regarding medicines are outlined above.   Tests Ordered: No orders of the defined types were placed in this encounter.   Medication Changes: No orders of the defined types were placed in this encounter.   Follow Up:  Either In Person or Virtual in 3 month(s)  Signed, Verta Ellen, NP  05/20/2019 10:15 AM    [place]

## 2019-05-20 ENCOUNTER — Other Ambulatory Visit: Payer: Self-pay

## 2019-05-20 ENCOUNTER — Telehealth (INDEPENDENT_AMBULATORY_CARE_PROVIDER_SITE_OTHER): Payer: Medicaid Other | Admitting: Family Medicine

## 2019-05-20 VITALS — Ht 73.0 in | Wt 255.0 lb

## 2019-05-20 DIAGNOSIS — G4733 Obstructive sleep apnea (adult) (pediatric): Secondary | ICD-10-CM

## 2019-05-20 DIAGNOSIS — I10 Essential (primary) hypertension: Secondary | ICD-10-CM

## 2019-05-20 DIAGNOSIS — D869 Sarcoidosis, unspecified: Secondary | ICD-10-CM | POA: Diagnosis not present

## 2019-05-20 DIAGNOSIS — Z8679 Personal history of other diseases of the circulatory system: Secondary | ICD-10-CM

## 2019-05-20 DIAGNOSIS — I309 Acute pericarditis, unspecified: Secondary | ICD-10-CM | POA: Diagnosis not present

## 2019-05-20 NOTE — Patient Instructions (Signed)
Medication Instructions:  Your physician recommends that you continue on your current medications as directed. Please refer to the Current Medication list given to you today.  *If you need a refill on your cardiac medications before your next appointment, please call your pharmacy*   Lab Work: None ordered  If you have labs (blood work) drawn today and your tests are completely normal, you will receive your results only by: Marland Kitchen MyChart Message (if you have MyChart) OR . A paper copy in the mail If you have any lab test that is abnormal or we need to change your treatment, we will call you to review the results.   Testing/Procedures: None ordered    Follow-Up: At [facility], you and your health needs are our priority.  As part of our continuing mission to provide you with exceptional heart care, we have created designated Provider Care Teams.  These Care Teams include your primary Cardiologist (physician) and Advanced Practice Providers (APPs -  Physician Assistants and Nurse Practitioners) who all work together to provide you with the care you need, when you need it.  We recommend signing up for the patient portal called "MyChart".  Sign up information is provided on this After Visit Summary.  MyChart is used to connect with patients for Virtual Visits (Telemedicine).  Patients are able to view lab/test results, encounter notes, upcoming appointments, etc.  Non-urgent messages can be sent to your provider as well.   To learn more about what you can do with MyChart, go to [email or web address].    Your next appointment:   3 month(s)  The format for your next appointment:   In Person  Provider:    You may see Dr. Saunders Revel or one of the following Advanced Practice Providers on your designated Care Team:    Murray Hodgkins, NP  Christell Faith, PA-C  Marrianne Mood, PA-C

## 2019-05-30 ENCOUNTER — Ambulatory Visit: Payer: Medicaid Other | Admitting: Pulmonary Disease

## 2019-06-03 NOTE — Progress Notes (Signed)
Virtual Visit via Telephone Note  I connected with Luis Mcknight on 06/04/19 at 10:00 AM EDT by telephone and verified that I am speaking with the correct person using two identifiers.  Location: Patient: Home Provider: [facility] - [id number], [place], [place], [place] [address]   I discussed the limitations, risks, security and privacy concerns of performing an evaluation and management service by telephone and the availability of in person appointments. I also discussed with the patient that there may be a patient responsible charge related to this service. The patient expressed understanding and agreed to proceed.  Patient consented to consult via telephone: Yes People present and their role in pt care: Pt     History of Present Illness:  39 year old male current smoker followed in our office for sarcoidosis, severe obstructive sleep apnea and allergic rhinitis  Past medical history: Hypertension Smoking history: Current smoker. 6 cigarettes a day.  Maintenance: 10 mg daily prednisone Patient of Dr. Patsey Berthold  Chief complaint:   39 year old male current smoker followed in our office for sarcoidosis.  Biopsy-proven with granulomatous inflammation August/2020.  Normal ACE level.  Currently maintained on 10 mg of daily prednisone.  CT in November/2020 showed improvement.  Patient following up with our office today to review recent CPAP start.  CPAP compliance report listed below:  05/04/2019-06/02/2019-CPAP compliance-14 on the last 30 days used, 7 of those days greater than 4 hours, average usage 4 hours and 28 minutes, CPAP set pressure of 11, AHI 0.3  Patient feels that the CPAP is okay but he feels the pressure is too strong after the CPAP titration.  He is unsure if this at a pressure of 11 works for him.  He feels that sometimes it leaks.  No noticeable leaks on compliance report today.   Patient has no other current complaints today  He is working to stop  smoking is down to 6 cigarettes a day.  See smoking assessment and cessation counseling listed below:  Smoking assessment and cessation counseling  Patient currently smoking: 6 cigarettes a day  I have advised the patient to quit/stop smoking as soon as possible due to high risk for multiple medical problems.  It will also be very difficult for [place] to manage patient's  respiratory symptoms and status if we continue to expose her lungs to a known irritant.  We do not advise e-cigarettes as a form of stopping smoking.  Patient is willing to quit smoking. Hasnt set quit date.   Offered nicotine replacement therapies and Chantix.  Patient will think about this.  Also reviewed the patient can follow-up with primary care or our office if he decides that he would like to proceed forward with these therapies.  I have advised the patient that we can assist and have options of nicotine replacement therapy, provided smoking cessation education today, provided smoking cessation counseling, and provided cessation resources.  Follow-up next office visit office visit for assessment of smoking cessation.    Smoking cessation counseling advised for: 4 min     Observations/Objective:  Social History   Tobacco Use  Smoking Status Current Every Day Smoker  . Packs/day: 1.00  . Years: 21.00  . Pack years: 21.00  . Types: Cigarettes  . Start date: 03/07/1998  Smokeless Tobacco Never Used  Tobacco Comment   down to 6 cigarettes a day     There is no immunization history on file for this patient.  As he is a current smoker patient is  due for the Pneumovax 23, seasonal flu vaccine, we also would recommend SARS-CoV-2 vaccine when available to the patient  Assessment and Plan:  OSA (obstructive sleep apnea) Patient feels pressures are too high Moderate compliance, needs improvement  Plan: We will change patient's pressure to APAP 8-12 to see if this can improve patient's compliance and comfort  with using CPAP Emphasized the importance of the patient using the CPAP nightly for greater than 4 hours Explained to the patient that when he is wearing his CPAP it is adequately treating his apneas 27-month follow-up with sleep MD in [place] office  Sarcoidosis Plan: Continue prednisone 10 mg Can consider titrating down on prednisone dose if imaging continues to improve 84-month follow-up with our office in person with Dr. Patsey Berthold   Current smoker Plan: Recommend the patient stop smoking Reviewed the patient can follow-up with primary care or pulmonary to help with smoking cessation Offered nicotine replacement or Chantix, patient to think about it  Healthcare maintenance We recommend the patient received: Pneumovax 23 as he is a current smoker Seasonal flu vaccine SARS-CoV-2 vaccine when available to the patient   Follow Up Instructions:  Return in about 2 months (around 08/04/2019), or if symptoms worsen or fail to improve, for [facility].   I discussed the assessment and treatment plan with the patient. The patient was provided an opportunity to ask questions and all were answered. The patient agreed with the plan and demonstrated an understanding of the instructions.   The patient was advised to call back or seek an in-person evaluation if the symptoms worsen or if the condition fails to improve as anticipated.  I provided 23 minutes of non-face-to-face time during this encounter.   Lauraine Rinne, NP

## 2019-06-04 ENCOUNTER — Ambulatory Visit (INDEPENDENT_AMBULATORY_CARE_PROVIDER_SITE_OTHER): Payer: Medicaid Other | Admitting: Pulmonary Disease

## 2019-06-04 ENCOUNTER — Encounter: Payer: Self-pay | Admitting: Pulmonary Disease

## 2019-06-04 DIAGNOSIS — Z0000 Encounter for general adult medical examination without abnormal findings: Secondary | ICD-10-CM | POA: Insufficient documentation

## 2019-06-04 DIAGNOSIS — D869 Sarcoidosis, unspecified: Secondary | ICD-10-CM | POA: Diagnosis not present

## 2019-06-04 DIAGNOSIS — G4733 Obstructive sleep apnea (adult) (pediatric): Secondary | ICD-10-CM | POA: Diagnosis not present

## 2019-06-04 DIAGNOSIS — F17200 Nicotine dependence, unspecified, uncomplicated: Secondary | ICD-10-CM | POA: Diagnosis not present

## 2019-06-04 NOTE — Assessment & Plan Note (Signed)
Plan: Continue prednisone 10 mg Can consider titrating down on prednisone dose if imaging continues to improve 74-month follow-up with our office in person with Dr. Patsey Berthold

## 2019-06-04 NOTE — Patient Instructions (Addendum)
You were seen today by Lauraine Rinne, NP  for:   Nice talking with you today over the phone.  We will adjust her CPAP pressures to see if this helps you with adherence.  Please use your CPAP every night for greater than 4 hours.  Please call our office if symptoms are not improving.  Continue your prednisone.  We will see you back in office in 2 months with a sleep MD regarding your CPAP.  We will see you back in 4 months with Dr. Patsey Berthold regarding your sarcoidosis.  Take care and stay safe,  Almon Whitford  1. OSA (obstructive sleep apnea)  Change CPAP pressures to APAP 8-12   We recommend that you continue using your CPAP daily >>>Keep up the hard work using your device >>> Goal should be wearing this for the entire night that you are sleeping, at least 4 to 6 hours  Remember:  . Do not drive or operate heavy machinery if tired or drowsy.  . Please notify the supply company and office if you are unable to use your device regularly due to missing supplies or machine being broken.  . Work on maintaining a healthy weight and following your recommended nutrition plan  . Maintain proper daily exercise and movement  . Maintaining proper use of your device can also help improve management of other chronic illnesses such as: Blood pressure, blood sugars, and weight management.   BiPAP/ CPAP Cleaning:  >>>Clean weekly, with Dawn soap, and bottle brush.  Set up to air dry. >>> Wipe mask out daily with wet wipe or towelette    2. Sarcoidosis  Continue prednisone 10 mg daily  3. Current smoker  We recommend that you stop smoking.  >>>You need to set a quit date >>>If you have friends or family who smoke, let them know you are trying to quit and not to smoke around you or in your living environment  Smoking Cessation Resources:  1 800 QUIT NOW  >>> Patient to call this resource and utilize it to help support her quit smoking >>> Keep up your hard work with stopping smoking  You can also  contact the [facility] >>>For smoking cessation classes call [phone]  We do not recommend using e-cigarettes as a form of stopping smoking  You can sign up for smoking cessation support texts and information:  >>>https://smokefree.gov/smokefreetxt    4. Healthcare maintenance  We recommend that you receive the Pneumovax 23, the pneumonia vaccine given the fact that you are a current smoker  We recommend that you received the seasonal flu vaccine every year  We recommend that you receive the Covid vaccine when available to you  COVID-19 Vaccine Information can be found at: ShippingScam.co.uk For questions related to vaccine distribution or appointments, please email vaccine@[place] .com or call (936)421-3226.     Follow Up:    Return in about 2 months (around 08/04/2019), or if symptoms worsen or fail to improve, for [facility].  35-month follow-up with Dr. Patsey Berthold  Please do your part to reduce the spread of COVID-19:      Reduce your risk of any infection  and COVID19 by using the similar precautions used for avoiding the common cold or flu:  Marland Kitchen Wash your hands often with soap and warm water for at least 20 seconds.  If soap and water are not readily available, use an alcohol-based hand sanitizer with at least 60% alcohol.  . If coughing or sneezing, cover your  mouth and nose by coughing or sneezing into the elbow areas of your shirt or coat, into a tissue or into your sleeve (not your hands). Langley Gauss A MASK when in public  . Avoid shaking hands with others and consider head nods or verbal greetings only. . Avoid touching your eyes, nose, or mouth with unwashed hands.  . Avoid close contact with people who are sick. . Avoid places or events with large numbers of people in one location, like concerts or sporting events. . If you have some symptoms but not all symptoms, continue  to monitor at home and seek medical attention if your symptoms worsen. . If you are having a medical emergency, call 911.   [place] / e-Visit: [email or web address]         [facility]: [facility]: [id number]                   [facility]: [id number]     It is flu season:   >>> Best ways to protect herself from the flu: Receive the yearly flu vaccine, practice good hand hygiene washing with soap and also using hand sanitizer when available, eat a nutritious meals, get adequate rest, hydrate appropriately   Please contact the office if your symptoms worsen or you have concerns that you are not improving.   Thank you for choosing [facility] for your healthcare, and for allowing [place] to partner with you on your healthcare journey. I am thankful to be able to provide care to you today.   Wyn Quaker FNP-C

## 2019-06-04 NOTE — Assessment & Plan Note (Signed)
We recommend the patient received: Pneumovax 23 as he is a current smoker Seasonal flu vaccine SARS-CoV-2 vaccine when available to the patient

## 2019-06-04 NOTE — Assessment & Plan Note (Signed)
Plan: Recommend the patient stop smoking Reviewed the patient can follow-up with primary care or pulmonary to help with smoking cessation Offered nicotine replacement or Chantix, patient to think about it

## 2019-06-04 NOTE — Assessment & Plan Note (Signed)
Patient feels pressures are too high Moderate compliance, needs improvement  Plan: We will change patient's pressure to APAP 8-12 to see if this can improve patient's compliance and comfort with using CPAP Emphasized the importance of the patient using the CPAP nightly for greater than 4 hours Explained to the patient that when he is wearing his CPAP it is adequately treating his apneas 46-month follow-up with sleep MD in [place] office

## 2019-06-04 NOTE — Addendum Note (Signed)
Addended by: Amado Coe on: 06/04/2019 09:05 AM   Modules accepted: Orders

## 2019-06-06 DIAGNOSIS — G4733 Obstructive sleep apnea (adult) (pediatric): Secondary | ICD-10-CM | POA: Diagnosis not present

## 2019-06-10 NOTE — Progress Notes (Signed)
Reviewed and agree with assessment/plan.   Christiana Gurevich, MD [facility] 03/02/2016, 12:24 PM Pager:  [phone]  

## 2019-07-06 DIAGNOSIS — G4733 Obstructive sleep apnea (adult) (pediatric): Secondary | ICD-10-CM | POA: Diagnosis not present

## 2019-07-25 ENCOUNTER — Telehealth: Payer: Self-pay | Admitting: Pulmonary Disease

## 2019-07-25 NOTE — Telephone Encounter (Signed)
Called and spoke with patient about recs from Dr. Patsey Berthold. Expressed that he needs to go to urgent care or the ED for further evaluation. Patient expressed understanding. Nothing further needed at this time.

## 2019-07-25 NOTE — Telephone Encounter (Signed)
Recommend that he go to urgent care or ED for evaluation.  He may need further imaging.  Particularly since the issue persists and has gotten worse.  Dr. Darnell Level.

## 2019-07-25 NOTE — Progress Notes (Signed)
Agree with the details of the encounter as noted below.  If he continues to have difficulties with CPAP will refer to sleep medicine.  Renold Don, MD [facility]

## 2019-07-25 NOTE — Telephone Encounter (Signed)
Called and spoke with patient about symptoms. He states he has brought it before that he is having Left side pain that is worse when he inhales or tries to breath in deeply and worse when he bends over. States that he has had it for awhile but recently a little more than normal.   Dr. Patsey Berthold please advise

## 2019-08-06 DIAGNOSIS — G4733 Obstructive sleep apnea (adult) (pediatric): Secondary | ICD-10-CM | POA: Diagnosis not present

## 2019-08-08 ENCOUNTER — Ambulatory Visit: Payer: Medicaid Other | Admitting: Pulmonary Disease

## 2019-08-27 ENCOUNTER — Telehealth: Payer: Self-pay

## 2019-08-27 NOTE — Telephone Encounter (Signed)
Received forms from [employer].  Forms have been forwarded to [employer].

## 2019-08-30 DIAGNOSIS — G4733 Obstructive sleep apnea (adult) (pediatric): Secondary | ICD-10-CM | POA: Diagnosis not present

## 2019-09-04 ENCOUNTER — Ambulatory Visit (INDEPENDENT_AMBULATORY_CARE_PROVIDER_SITE_OTHER): Payer: Medicaid Other | Admitting: Internal Medicine

## 2019-09-04 ENCOUNTER — Encounter: Payer: Self-pay | Admitting: Internal Medicine

## 2019-09-04 ENCOUNTER — Other Ambulatory Visit: Payer: Self-pay

## 2019-09-04 VITALS — BP 138/100 | HR 94 | Ht 73.0 in | Wt 246.4 lb

## 2019-09-04 DIAGNOSIS — I10 Essential (primary) hypertension: Secondary | ICD-10-CM | POA: Diagnosis not present

## 2019-09-04 DIAGNOSIS — R079 Chest pain, unspecified: Secondary | ICD-10-CM | POA: Diagnosis not present

## 2019-09-04 DIAGNOSIS — D869 Sarcoidosis, unspecified: Secondary | ICD-10-CM

## 2019-09-04 MED ORDER — NAPROXEN 500 MG PO TABS: 500.0000 mg | ORAL_TABLET | Freq: Two times a day (BID) | ORAL | Status: AC

## 2019-09-04 MED ORDER — FAMOTIDINE 20 MG PO TABS: 20.0000 mg | ORAL_TABLET | Freq: Two times a day (BID) | ORAL | Status: DC

## 2019-09-04 NOTE — Patient Instructions (Signed)
Medication Instructions:  Your physician has recommended you make the following change in your medication:  1- TAKE Naproxen 500 mg by mouth two times a day for 1 week - over-the-counter. 2- TAKE Famotidine 20 mg by mouth two times a day - over-the-counter.  *If you need a refill on your cardiac medications before your next appointment, please call your pharmacy*  Follow-Up: At [facility], you and your health needs are our priority.  As part of our continuing mission to provide you with exceptional heart care, we have created designated Provider Care Teams.  These Care Teams include your primary Cardiologist (physician) and Advanced Practice Providers (APPs -  Physician Assistants and Nurse Practitioners) who all work together to provide you with the care you need, when you need it.  We recommend signing up for the patient portal called "MyChart".  Sign up information is provided on this After Visit Summary.  MyChart is used to connect with patients for Virtual Visits (Telemedicine).  Patients are able to view lab/test results, encounter notes, upcoming appointments, etc.  Non-urgent messages can be sent to your provider as well.   To learn more about what you can do with MyChart, go to [email or web address].    Your next appointment:   1 month(s)  The format for your next appointment:   In Person  Provider:    You may see DR Harrell Gave END or one of the following Advanced Practice Providers on your designated Care Team:    Murray Hodgkins, NP  Christell Faith, PA-C  Marrianne Mood, PA-C

## 2019-09-04 NOTE — Progress Notes (Signed)
Follow-up Outpatient Visit Date: 09/04/2019  Primary Care Provider: Kizzie Bane [place] [address]  Chief Complaint: Chest pain  HPI:  [name] is a 39 y.o. male with history of sarcoidosis, pericarditis, hypertension, arthritis, and GERD, who presents for follow-up of pericarditis and sarcoidosis.  He was last seen for via virtual visit in March by Levell July, NP.  At that time, he noted significant improvement in chest pain with only occasional transient chest discomfort.  Chronic exertional dyspnea with stable.  Remained on colchicine twice daily given his history of recurrent pericarditis, as well as prednisone for management of his pulmonary sarcoidosis.  Today, [name] has multiple complaints.  He continues to have left-sided chest pain, which seems to be worse when he moves in certain ways or takes a deep breath.  The pain usually lasts for a few seconds at a time and is not exertional.  He notes occasional tenderness on the left lateral chest wall.  He also notes occasional chest pain when eating.  He denies shortness of breath, palpitations, lightheadedness, and edema.  [name] was previously on prednisone, though his prescription ran out and he was unable to refill this through his pharmacy.  He reached out to Dr. Domingo Dimes office last month regarding his chest pain and was advised that he should go to the ER for further evaluation; he did not but now wonders if he should go to the ED.  --------------------------------------------------------------------------------------------------  Cardiovascular History & Procedures: Cardiovascular Problems:  Chest pain and sarcoidosis  Pericarditis  Risk Factors:  Hypertension, male gender, and obesity  Cath/PCI:  None  CV Surgery:  None  EP Procedures and Devices:  None  Non-Invasive Evaluation(s):  Cardiac MRI (04/29/2019): Normal LV size and function (LVEF 70%).  Normal  RV size and function.  Trivial mitral and aortic regurgitation.  No pericardial effusion.  No abnormal delayed hyperenhancement to suggest scar, infiltration, sarcoidosis.  TTE (12/11/2018): Normal LV size and wall thickness.  LVEF 60-65% with normal diastolic function.  Normal RV size and function.  Trivial mitral, pulmonic, and tricuspid regurgitation.  Moderately dilated pulmonary artery.  Borderline dilation of the aortic root (3.8 cm).  Recent CV Pertinent Labs: Lab Results  Component Value Date   BNP 4.0 12/21/2017   K 4.4 04/03/2019   BUN 16 04/03/2019   CREATININE 0.98 04/03/2019    Past medical and surgical history were reviewed and updated in EPIC.  Current Meds  Medication Sig  . acetaminophen (TYLENOL) 500 MG tablet Take 500-1,000 mg by mouth every 6 (six) hours as needed.  Marland Kitchen amLODipine (NORVASC) 10 MG tablet Take 10 mg by mouth every morning.   . Colchicine (MITIGARE) 0.6 MG CAPS Take 1 capsule by mouth 2 (two) times daily.  . fluticasone (FLONASE) 50 MCG/ACT nasal spray Place 1 spray into both nostrils 2 (two) times daily.    Allergies: Latex  Social History   Tobacco Use  . Smoking status: Former Smoker    Packs/day: 1.00    Years: 21.00    Pack years: 21.00    Types: Cigarettes    Start date: 03/07/1998    Quit date: 08/14/2019    Years since quitting: 0.0  . Smokeless tobacco: Never Used  . Tobacco comment: down to 6 cigarettes a day   Vaping Use  . Vaping Use: Former  . Start date: 04/18/2012  . Quit date: 04/18/2016  Substance Use Topics  . Alcohol use: Not Currently  . Drug  use: No    Family History  Problem Relation Age of Onset  . Hypertension Mother   . Diabetes Mother   . Diabetes Father   . Hypertension Father   . Heart attack Father 50    Review of Systems: Luis Mcknight complains of multiple joint pains, most notable in his knees.  Otherwise, a 12-system review of systems was performed and was negative except as noted in the  HPI.  --------------------------------------------------------------------------------------------------  Physical Exam: BP (!) 138/100 (BP Location: Left Arm, Patient Position: Sitting, Cuff Size: Normal)   Pulse 94   Ht 6\' 1"  (1.854 m)   Wt 246 lb 6 oz (111.8 kg)   SpO2 98%   BMI 32.51 kg/m   General: NAD. Neck: No JVD or HJR. Lungs: Clear to auscultation without wheeze or crackles. Heart: Regular rate and rhythm without murmurs, rubs, or gallops.  Mild left lateral chest wall tenderness noted with palpation of ribs. Abdomen: Soft, nontender, nondistended. Extremities: No lower extremity edema.  EKG: Normal sinus rhythm without abnormality.  Lab Results  Component Value Date   WBC 7.1 01/01/2019   HGB 14.7 01/01/2019   HCT 45.7 01/01/2019   MCV 80.3 01/01/2019   PLT 237 01/01/2019    Lab Results  Component Value Date   NA 141 04/03/2019   K 4.4 04/03/2019   CL 103 04/03/2019   CO2 23 04/03/2019   BUN 16 04/03/2019   CREATININE 0.98 04/03/2019   GLUCOSE 95 04/03/2019   ALT 52 (H) 01/01/2019    No results found for: CHOL, HDL, LDLCALC, LDLDIRECT, TRIG, CHOLHDL  --------------------------------------------------------------------------------------------------  ASSESSMENT AND PLAN: Chest pain: Pain is not consistent with angina or acute pericarditis.  I most suspicious for musculoskeletal etiology, though pleurisy is also a concern.  EKG today is normal.  I have suggested an empiric course of naproxen 500 mg twice daily x1 week as well as famotidine 20 mg twice daily.  If symptoms do not improve, Mr. Petter should contact us or seek immediate medical attention.  We will continue colchicine for now to complete a 31-month course of 0.6 mg twice daily.  At that point, I would recommend tapering off.  Sarcoidosis: I suspect many of Mr. Havery Moros pain complaints could be related to sarcoidosis.  He has a follow-up appointment with pulmonology (Dr. Halford Chessman), next month.  I have  asked Mr. Cadman to reach out to the pulmonary clinic in the meantime to determine if he needs to restart prednisone.  Hypertension: Blood pressure mildly elevated today in the setting of pain.  I will defer escalation of his current antihypertensive therapy consisting of amlodipine.  Follow-up: Return to clinic in 1 month.  Nelva Bush, MD 09/04/2019 11:01 AM

## 2019-09-05 ENCOUNTER — Telehealth: Payer: Self-pay | Admitting: Pulmonary Disease

## 2019-09-05 MED ORDER — PREDNISONE 10 MG PO TABS: 10.0000 mg | ORAL_TABLET | Freq: Every day | ORAL | 1 refills | Status: DC

## 2019-09-05 NOTE — Progress Notes (Signed)
He has missed appointments with me.  He has a sleep medicine appointment coming up however he failed to follow-up with me as previously scheduled.  In addition, he NEVER called to get his prednisone refilled.  I will make sure he has an upcoming appointment with me.

## 2019-09-05 NOTE — Telephone Encounter (Signed)
Per Dr. Patsey Berthold verbally- refill prednisone 10mg  daily. She had spoken with Dr. Saunders Revel in regards to pt needing a refill on this medication. Pt felt that our office had declined refills. Per our records, I do not see that a request was sent from pharmacy or that pt called in requesting this medication.  Pt is aware that a refill for prednisone 10mg  has been sent to [facility]. Nothing further is needed at this time.

## 2019-09-11 DIAGNOSIS — G4733 Obstructive sleep apnea (adult) (pediatric): Secondary | ICD-10-CM | POA: Diagnosis not present

## 2019-09-16 ENCOUNTER — Telehealth: Payer: Self-pay | Admitting: Internal Medicine

## 2019-09-16 NOTE — Telephone Encounter (Signed)
Symptoms seem unusual for naproxen and famotidine.  However, since there is some temporal correlation, I suggest that the patient stop both of these medications.  If he continues to have headaches and temple swelling, he should follow-up with his PCP.  Nelva Bush, MD [facility]

## 2019-09-16 NOTE — Telephone Encounter (Signed)
Spoke with the patients. Patients wife made aware of Dr. Darnelle Bos response and recommendation. She will update the patient and Dr. Darnelle Bos recommendation. Patient wife voiced appreciation for the assistance.

## 2019-09-16 NOTE — Telephone Encounter (Signed)
Patient wife states his temples are swelling and has had a headache . States this started when he started taking Naproxen (recommended by Dr. Saunders Revel) and also on Prednisone. Please call to discuss .

## 2019-09-16 NOTE — Telephone Encounter (Signed)
DPR on file. Spoke with the patients wife. Patient has completed the 1 week recommended dosage of Naproxen.   He is taking  Prednisone 10 mg daily Amlodipine 10 mg daily Colchicine 0.6 mg bid Famotidine 20 mg daily (OTC)  Patients wife sts that yesterday the patient complained of a headache and swelling of his temples. Patient denies any other sign of swelling, no difficulty swallowing. No fever or cough. Patients BP yesterday 137/107. Patient laid down and took a nap. BP recheck 134/87.  Patient has had a systemic rash that comes and goes and has been responding to benadryl. He has notice that this started when he started taking famotidine.  Adv the patients wife that I will fwd the message to Dr. Saunders Revel and call back with his response and recommendation. Patients wife verbalized understanding and voiced appreciation for the call back.

## 2019-09-26 ENCOUNTER — Encounter: Payer: Self-pay | Admitting: Pulmonary Disease

## 2019-09-26 ENCOUNTER — Other Ambulatory Visit: Payer: Self-pay

## 2019-09-26 ENCOUNTER — Ambulatory Visit (INDEPENDENT_AMBULATORY_CARE_PROVIDER_SITE_OTHER): Payer: Medicaid Other | Admitting: Pulmonary Disease

## 2019-09-26 VITALS — BP 142/82 | HR 84 | Temp 97.3°F | Ht 73.0 in | Wt 247.0 lb

## 2019-09-26 DIAGNOSIS — G4733 Obstructive sleep apnea (adult) (pediatric): Secondary | ICD-10-CM | POA: Diagnosis not present

## 2019-09-26 NOTE — Progress Notes (Signed)
Pulmonary, Critical Care, and Sleep Medicine  Chief Complaint  Patient presents with  . Follow-up    wearing cpap avg 7hr nightly- feels that pressure and mask are okay.     Constitutional:  BP (!) 142/82 (BP Location: Left Arm, Cuff Size: Large)   Pulse 84   Temp (!) 97.3 F (36.3 C) (Temporal)   Ht 6\' 1"  (1.854 m)   Wt 247 lb (112 kg)   SpO2 98%   BMI 32.59 kg/m   Past Medical History:  OA, Depression, GERD, HTN, PNA  Summary:  Luis Mcknight is a 39 y.o. male former smoker with obstructive sleep apnea and sarcoidosis.  Subjective:   He tries to use CPAP nightly.  Falls asleep often on the cough while watching TV.  He then won't wake up until the next morning.  When he uses CPAP, he feels this helps.  Has full face mask.  No issue with mask fit.  Denies sinus congestion, sore throat, dry mouth, or aerophagia.   Physical Exam:   Appearance - well kempt  ENMT - no sinus tenderness, no nasal discharge, no oral exudate, Mallampati 4, elongated uvula, TM clear b/l  Respiratory - no wheeze, or rales  CV - regular rate and rhythm, no murmurs  GI - soft, non tender  Lymph - no adenopathy noted in neck  Ext - no edema  Skin - no rashes  Neuro - normal strength, oriented x 3  Psych - normal mood and affect   Assessment/Plan:   Obstructive sleep apnea. - he reports benefit from CPAP - discussed options to help make sure he puts CPAP on before falling asleep - continue auto CPAP 8 -12 cm H2O  Sarcoidosis. - he will follow up with Dr. Patsey Berthold  Atypical chest pain. - he will follow up with cardiology  A total of  24 minutes spent addressing patient care issues on day of visit.  Follow up:  Patient Instructions  Follow up in 1 year   Signature:  Chesley Mires, MD [place] Pager: [phone] 09/26/2019, 9:33 AM  Flow Sheet     Pulmonary tests:   Quantiferon gold 09/27/18 >> negative  Bronchoscopy 10/08/18 >> non  necrotizing granulomatous inflammation  PFT 11/14/18 >> FEV1 3.85 (94%), FEV1% 84, TLC 5.35 (71%), DLCO 61%  Chest imaging:   CT angio chest 03/27/18 >> nodular ASD in LUL, scoliosis  CT chest 09/27/18 >> enlarged LN, widespread micronodularity  CT chest 01/14/19 >> mediastinal and b/l hilar LAN, nodularity  Sleep tests:   PSG 02/06/19 >> AHI 34.3, SpO2 low 88%  Auto CPAP 08/25/19 to 09/23/19 >> used on 13 of 30 nights with average 7 hrs 8 min.  Average AHI 0.3 with median CPAP 8 and 95 th percentile CPAP 10 cm H2O  Cardiac tests:  Echo 12/11/18 >> EF 60 to 65%, aortic root 38 mm  Medications:   Allergies as of 09/26/2019      Reactions   Latex Itching   GLOVES      Medication List       Accurate as of September 26, 2019  9:33 AM. If you have any questions, ask your nurse or doctor.        STOP taking these medications   fluticasone 50 MCG/ACT nasal spray Commonly known as: FLONASE Stopped by: Chesley Mires, MD     TAKE these medications   acetaminophen 500 MG tablet Commonly known as: TYLENOL Take 500-1,000 mg by mouth every 6 (six) hours  as needed.   amLODipine 10 MG tablet Commonly known as: NORVASC Take 10 mg by mouth every morning.   Colchicine 0.6 MG Caps Commonly known as: Mitigare Take 1 capsule by mouth 2 (two) times daily.   predniSONE 10 MG tablet Commonly known as: DELTASONE Take 1 tablet (10 mg total) by mouth daily with breakfast.       Past Surgical History:  He  has a past surgical history that includes No past surgeries; Endobronchial ultrasound (Left, 10/08/2018); and Electormagnetic navigation bronchoscopy (Left, 10/08/2018).  Family History:  His family history includes Diabetes in his father and mother; Heart attack (age of onset: 6) in his father; Hypertension in his father and mother.  Social History:  He  reports that he quit smoking about 6 weeks ago. His smoking use included cigarettes. He started smoking about 21 years ago. He has a  21.00 pack-year smoking history. He has never used smokeless tobacco. He reports previous alcohol use. He reports that he does not use drugs.

## 2019-09-26 NOTE — Progress Notes (Signed)
f °

## 2019-09-26 NOTE — Patient Instructions (Signed)
Follow up in 1 year.

## 2019-10-03 NOTE — Progress Notes (Signed)
Office Visit    Patient Name: Luis Mcknight Date of Encounter: 10/04/2019  Primary Care Provider:  Ferrel Logan, PA-C Primary Cardiologist:  Nelva Bush, MD Electrophysiologist:  None   Chief Complaint    Luis Mcknight is a 39 y.o. male with a hx of sarcoidisis, pericarditis, HTN, arthritis, GERD presents today for follow up of chest pain.    Past Medical History    Past Medical History:  Diagnosis Date  . Arthritis   . Depression    NO MEDS  . GERD (gastroesophageal reflux disease)    NO MEDS  . Hypertension   . Pneumonia    JUNE 2020   Past Surgical History:  Procedure Laterality Date  . ELECTROMAGNETIC NAVIGATION BROCHOSCOPY Left 10/08/2018   Procedure: ELECTROMAGNETIC NAVIGATION BRONCHOSCOPY, LEFT;  Surgeon: Tyler Pita, MD;  Location: [facility] ORS;  Service: Cardiopulmonary;  Laterality: Left;  . ENDOBRONCHIAL ULTRASOUND Left 10/08/2018   Procedure: ENDOBRONCHIAL ULTRASOUND, LEFT;  Surgeon: Tyler Pita, MD;  Location: [facility] ORS;  Service: Cardiopulmonary;  Laterality: Left;  . NO PAST SURGERIES      Allergies  Allergies  Allergen Reactions  . Latex Itching    GLOVES    History of Present Illness    Luis Mcknight is a 39 y.o. male with a hx of sarcoidosis, pericarditis, HTN, arthritis, GERD last seen 09/04/19 by Dr. Saunders Revel.  He was seen in clinic 05/2019 doing overall well with improvement in chest pain and occasional transient chest discomfort. 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 Seen 09/04/19 with worsening left sided chest pain. He had run out of his prednisone. Chest pain was not consistent with angina or acute pericarditis with likely etiology musculoskeletal vs pleurisy. He was recommended for naproxen for one week and famotidine. Recommend continuing colchicine to complete 6 month course and then taper.   He called the office one week later reporting  headache, swelling of temples, rash. He was recommended to stop naproxen and famotidine though symptoms were atypical for reaction to these medications.   Reports he has been having really bad "gas pain" that happens daily. He has been doing pepto tablet which seem to give relief. Feels it in his mid to right chest up to his neck. Tells me it sometimes is better when he eats. He is using Gas-X sometimes. Pain is worst at rest or if he lays down.   BP at home is 112-120/89 per his report via arm cuff. He tells me he is out of the colchicine. He stopped it about 2-3 weeks ago, but did not taper. No chest pain similar to what he previously had with pericarditis.  Reports no shortness of breath nor dyspnea on exertion. No edema, orthopnea, PND. Reports no palpitations.   EKGs/Labs/Other Studies Reviewed:   The following studies were reviewed today:  EKG:  EKG is ordered today.  The ekg ordered today demonstrates SR 74 bpm with occasional PAC.  Recent Labs: 01/01/2019: ALT 52; Hemoglobin 14.7; Platelets 237 04/03/2019: BUN 16; Creatinine, Ser 0.98; Potassium 4.4; Sodium 141  Recent Lipid Panel No results found for: CHOL, TRIG, HDL, CHOLHDL, VLDL, LDLCALC, LDLDIRECT  Home Medications   Current Meds  Medication Sig  . acetaminophen (TYLENOL) 500 MG tablet Take 500-1,000 mg by mouth every 6 (six) hours as needed.  Marland Kitchen amLODipine (NORVASC) 10 MG tablet Take 10 mg by mouth every morning.   . predniSONE (DELTASONE) 10 MG tablet Take 1  tablet (10 mg total) by mouth daily with breakfast.  . [DISCONTINUED] Colchicine (MITIGARE) 0.6 MG CAPS Take 1 capsule by mouth 2 (two) times daily.    Review of Systems    Review of Systems  Constitutional: Negative for chills, fever and malaise/fatigue.  Cardiovascular: Positive for chest pain. Negative for dyspnea on exertion, leg swelling, near-syncope, orthopnea, palpitations and syncope.  Respiratory: Negative for cough, shortness of breath and wheezing.    Gastrointestinal: Positive for constipation. Negative for nausea and vomiting.       (+) "gas"  Neurological: Negative for dizziness, light-headedness and weakness.   All other systems reviewed and are otherwise negative except as noted above.  Physical Exam    VS:  BP (!) 144/100 (BP Location: Left Arm, Patient Position: Sitting, Cuff Size: Normal)   Pulse 74   Ht 5\' 11"  (1.803 m)   Wt (!) 247 lb (112 kg)   SpO2 98%   BMI 34.45 kg/m  , BMI Body mass index is 34.45 kg/m. GEN: Well nourished, well developed, in no acute distress. HEENT: normal. Neck: Supple, no JVD, carotid bruits, or masses. Cardiac: RRR, no murmurs, rubs, or gallops. No clubbing, cyanosis, edema.  Radials/DP/PT 2+ and equal bilaterally.  Respiratory:  Respirations regular and unlabored, clear to auscultation bilaterally. GI: Soft, nontender, nondistended, BS + x 4. MS: No deformity or atrophy. Skin: Warm and dry, no rash. Neuro:  Strength and sensation are intact. Psych: Normal affect.  Assessment & Plan    1. Chest pain - Atypical for angina. Reports previous chest pain has resolved. Now with "gas pain" that is in his right chest at rest, relieved by belching and GasX. No chest pain nor dyspnea with exertion. EKG today with SR and occasional PAC - no acute ST/T wave changes. Symptoms are atypical for angina. No indication for ischemic evaluation. Likely etiology GERD. Given Rx for Protonix 20mg  daily. Encouraged to follow up with his PCP regarding GERD and reported constipation. Current symptoms are different than previous with pericarditis - he completed 6 month course of Colchicine - as he stopped 2-3 weeks ago will not taper at this time.   2. Sarcoidosis - Continue prednisone per pulmonology. Encouraged to schedule his appointment with Dr. Patsey Berthold.  3. HTN - BP elevated. Continue Amlodipine 10mg  daily. Start Losartan 25mg  daily.  4. OSA - CPAP compliance encouraged.  5. Tobacco use - Working on smoking  cessation. Smoking cessation encouraged. Recommend utilization of [id number].  6. Constipation - Has used over the counter enema and magnesium citrate. Encouraged to discuss with PCP. Encouraged to drink fluids and eat fiber.   Disposition: Follow up in 1 month(s) with Dr. Saunders Revel or APP for re-evaluation of BP  Loel Dubonnet, NP 10/04/2019, 3:59 PM

## 2019-10-04 ENCOUNTER — Encounter: Payer: Self-pay | Admitting: Family

## 2019-10-04 ENCOUNTER — Ambulatory Visit (INDEPENDENT_AMBULATORY_CARE_PROVIDER_SITE_OTHER): Payer: Medicaid Other | Admitting: Family

## 2019-10-04 ENCOUNTER — Other Ambulatory Visit: Payer: Self-pay

## 2019-10-04 VITALS — BP 144/100 | HR 74 | Ht 71.0 in | Wt 247.0 lb

## 2019-10-04 DIAGNOSIS — Z8679 Personal history of other diseases of the circulatory system: Secondary | ICD-10-CM

## 2019-10-04 DIAGNOSIS — K5900 Constipation, unspecified: Secondary | ICD-10-CM

## 2019-10-04 DIAGNOSIS — D869 Sarcoidosis, unspecified: Secondary | ICD-10-CM | POA: Diagnosis not present

## 2019-10-04 DIAGNOSIS — K219 Gastro-esophageal reflux disease without esophagitis: Secondary | ICD-10-CM

## 2019-10-04 DIAGNOSIS — I10 Essential (primary) hypertension: Secondary | ICD-10-CM

## 2019-10-04 DIAGNOSIS — R0789 Other chest pain: Secondary | ICD-10-CM | POA: Diagnosis not present

## 2019-10-04 DIAGNOSIS — G4733 Obstructive sleep apnea (adult) (pediatric): Secondary | ICD-10-CM

## 2019-10-04 MED ORDER — PANTOPRAZOLE SODIUM 20 MG PO TBEC: 20.0000 mg | DELAYED_RELEASE_TABLET | Freq: Every day | ORAL | 2 refills | Status: DC

## 2019-10-04 MED ORDER — LOSARTAN POTASSIUM 25 MG PO TABS: 25.0000 mg | ORAL_TABLET | Freq: Every day | ORAL | 3 refills | Status: DC

## 2019-10-04 NOTE — Patient Instructions (Addendum)
Medication Instructions:  Your physician has recommended you make the following change in your medication:   Ok to remain on off Colchicine.  START Pantoprazole 20mg  daily   START Losartan 25 mg  (1 tablet) by mouth once a day.  You may use a stool softener or Miralax as needed for constipation.   *If you need a refill on your cardiac medications before your next appointment, please call your pharmacy*  Lab Work: No lab work today.   Testing/Procedures: Your EKG today showed normal sinus rhythm with a single PAC (premature atrial contraction) this is an early beat in the top chambers of your heart that is really common and not dangerous.   Follow-Up: At [facility], you and your health needs are our priority.  As part of our continuing mission to provide you with exceptional heart care, we have created designated Provider Care Teams.  These Care Teams include your primary Cardiologist (physician) and Advanced Practice Providers (APPs -  Physician Assistants and Nurse Practitioners) who all work together to provide you with the care you need, when you need it.  We recommend signing up for the patient portal called "MyChart".  Sign up information is provided on this After Visit Summary.  MyChart is used to connect with patients for Virtual Visits (Telemedicine).  Patients are able to view lab/test results, encounter notes, upcoming appointments, etc.  Non-urgent messages can be sent to your provider as well.   To learn more about what you can do with MyChart, go to [email or web address].    Your next appointment:   1 month(s)  The format for your next appointment:   In Person  Provider:    You may see Nelva Bush, MD or one of the following Advanced Practice Providers on your designated Care Team:    Murray Hodgkins, NP  Christell Faith, PA-C  Marrianne Mood, PA-C  Laurann Montana, NP  Other Instructions Recommend you schedule your appointment with Dr. Patsey Berthold when  you check out today.

## 2019-10-09 ENCOUNTER — Telehealth: Payer: Self-pay | Admitting: Pulmonary Disease

## 2019-10-09 NOTE — Telephone Encounter (Signed)
Called and spoke to pt. Pt stated that his temples are swelling when eating. This has happened 3-4 times.  I have recommended that pt contact PCP.  Pt voiced his understanding and had no further questions.  Nothing further is needed.

## 2019-10-10 ENCOUNTER — Telehealth: Payer: Self-pay | Admitting: Pulmonary Disease

## 2019-10-10 NOTE — Telephone Encounter (Signed)
Pt has been made aware to contact medical records.  Provided patient with contact number. Nothing further is needed.

## 2019-10-10 NOTE — Telephone Encounter (Signed)
Lm for pt.   **pt will need to contact medical records to obtain records.

## 2019-10-11 DIAGNOSIS — I10 Essential (primary) hypertension: Secondary | ICD-10-CM | POA: Diagnosis not present

## 2019-10-11 DIAGNOSIS — G8929 Other chronic pain: Secondary | ICD-10-CM | POA: Diagnosis not present

## 2019-10-11 DIAGNOSIS — M26609 Unspecified temporomandibular joint disorder, unspecified side: Secondary | ICD-10-CM | POA: Diagnosis not present

## 2019-10-11 DIAGNOSIS — M79672 Pain in left foot: Secondary | ICD-10-CM | POA: Diagnosis not present

## 2019-10-11 DIAGNOSIS — M79671 Pain in right foot: Secondary | ICD-10-CM | POA: Diagnosis not present

## 2019-10-11 DIAGNOSIS — M545 Low back pain: Secondary | ICD-10-CM | POA: Diagnosis not present

## 2019-10-23 ENCOUNTER — Telehealth: Payer: Self-pay | Admitting: Pulmonary Disease

## 2019-10-23 NOTE — Telephone Encounter (Signed)
Called and spoke to patient.  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 A mouthguard was mentioned to him by PCP.  He is interested in a mouthguard vs cpap.   Dr. Halford Chessman, please advise. Thanks

## 2019-10-23 NOTE — Telephone Encounter (Signed)
Mouthguard for treating TMJ would have different format compared to oral appliance for treating obstructive sleep apnea.    I would recommend he continue auto CPAP.

## 2019-10-23 NOTE — Telephone Encounter (Signed)
Patient is aware of below message and voiced his understanding.  Nothing further is needed at this time.

## 2019-10-28 DIAGNOSIS — M79672 Pain in left foot: Secondary | ICD-10-CM | POA: Diagnosis not present

## 2019-10-28 DIAGNOSIS — M722 Plantar fascial fibromatosis: Secondary | ICD-10-CM | POA: Diagnosis not present

## 2019-10-28 DIAGNOSIS — M79671 Pain in right foot: Secondary | ICD-10-CM | POA: Diagnosis not present

## 2019-10-28 DIAGNOSIS — M7731 Calcaneal spur, right foot: Secondary | ICD-10-CM | POA: Diagnosis not present

## 2019-11-04 ENCOUNTER — Encounter: Payer: Self-pay | Admitting: Family

## 2019-11-04 ENCOUNTER — Telehealth (INDEPENDENT_AMBULATORY_CARE_PROVIDER_SITE_OTHER): Payer: Medicaid Other | Admitting: Family

## 2019-11-04 ENCOUNTER — Telehealth: Payer: Self-pay | Admitting: Family

## 2019-11-04 VITALS — BP 139/89 | HR 77 | Ht 73.0 in | Wt 245.0 lb

## 2019-11-04 DIAGNOSIS — K219 Gastro-esophageal reflux disease without esophagitis: Secondary | ICD-10-CM | POA: Diagnosis not present

## 2019-11-04 DIAGNOSIS — I10 Essential (primary) hypertension: Secondary | ICD-10-CM

## 2019-11-04 DIAGNOSIS — Z8679 Personal history of other diseases of the circulatory system: Secondary | ICD-10-CM

## 2019-11-04 DIAGNOSIS — G4733 Obstructive sleep apnea (adult) (pediatric): Secondary | ICD-10-CM | POA: Diagnosis not present

## 2019-11-04 DIAGNOSIS — D869 Sarcoidosis, unspecified: Secondary | ICD-10-CM

## 2019-11-04 MED ORDER — LOSARTAN POTASSIUM 50 MG PO TABS: 50.0000 mg | ORAL_TABLET | Freq: Every day | ORAL | 1 refills | Status: DC

## 2019-11-04 NOTE — Telephone Encounter (Signed)
  Patient Consent for Virtual Visit         Luis Mcknight has provided verbal consent on 11/04/2019 for a virtual visit (video or telephone).   CONSENT FOR VIRTUAL VISIT FOR:  Luis Mcknight  By participating in this virtual visit I agree to the following:  I hereby voluntarily request, consent and authorize [facility] and its employed or contracted physicians, physician assistants, nurse practitioners or other licensed health care professionals (the Practitioner), to provide me with telemedicine health care services (the "Services") as deemed necessary by the treating Practitioner. I acknowledge and consent to receive the Services by the Practitioner via telemedicine. I understand that the telemedicine visit will involve communicating with the Practitioner through live audiovisual communication technology and the disclosure of certain medical information by electronic transmission. I acknowledge that I have been given the opportunity to request an in-person assessment or other available alternative prior to the telemedicine visit and am voluntarily participating in the telemedicine visit.  I understand that I have the right to withhold or withdraw my consent to the use of telemedicine in the course of my care at any time, without affecting my right to future care or treatment, and that the Practitioner or I may terminate the telemedicine visit at any time. I understand that I have the right to inspect all information obtained and/or recorded in the course of the telemedicine visit and may receive copies of available information for a reasonable fee.  I understand that some of the potential risks of receiving the Services via telemedicine include:  Marland Kitchen Delay or interruption in medical evaluation due to technological equipment failure or disruption; . Information transmitted may not be sufficient (e.g. poor resolution of images) to allow for appropriate medical decision making by the Practitioner;  and/or  . In rare instances, security protocols could fail, causing a breach of personal health information.  Furthermore, I acknowledge that it is my responsibility to provide information about my medical history, conditions and care that is complete and accurate to the best of my ability. I acknowledge that Practitioner's advice, recommendations, and/or decision may be based on factors not within their control, such as incomplete or inaccurate data provided by me or distortions of diagnostic images or specimens that may result from electronic transmissions. I understand that the practice of medicine is not an exact science and that Practitioner makes no warranties or guarantees regarding treatment outcomes. I acknowledge that a copy of this consent can be made available to me via my patient portal ([facility]), or I can request a printed copy by calling the office of [facility].    I understand that my insurance will be billed for this visit.   I have read or had this consent read to me. . I understand the contents of this consent, which adequately explains the benefits and risks of the Services being provided via telemedicine.  . I have been provided ample opportunity to ask questions regarding this consent and the Services and have had my questions answered to my satisfaction. . I give my informed consent for the services to be provided through the use of telemedicine in my medical care

## 2019-11-04 NOTE — Patient Instructions (Signed)
Medication Instructions:  Your provider has recommended the following:  CONTINUE Losartan 50mg  daily A refill was sent to the [facility] in [place]. You may use up your 25mg  tablets by taking two until you run out.   CONTINUE Amlodipine 10mg  daily  *If you need a refill on your cardiac medications before your next appointment, please call your pharmacy*  Lab Work: No lab work ordered today.  Testing/Procedures: None ordered today.   Follow-Up: At [facility], you and your health needs are our priority.  As part of our continuing mission to provide you with exceptional heart care, we have created designated Provider Care Teams.  These Care Teams include your primary Cardiologist (physician) and Advanced Practice Providers (APPs -  Physician Assistants and Nurse Practitioners) who all work together to provide you with the care you need, when you need it.  We recommend signing up for the patient portal called "MyChart".  Sign up information is provided on this After Visit Summary.  MyChart is used to connect with patients for Virtual Visits (Telemedicine).  Patients are able to view lab/test results, encounter notes, upcoming appointments, etc.  Non-urgent messages can be sent to your provider as well.   To learn more about what you can do with MyChart, go to [email or web address].    Your next appointment:   3 month(s)  The format for your next appointment:   In Person  Provider:   You may see Nelva Bush, MD or one of the following Advanced Practice Providers on your designated Care Team:    Murray Hodgkins, NP  Christell Faith, PA-C  Marrianne Mood, PA-C  Laurann Montana, NP  Other Instructions  Laurann Montana, NP will send you a MyChart message in a couple weeks to check in on your blood pressure. Our eventual goal is for it to be less than 130/80, but we will take steps to get there. Many people require more than one medication to help get their blood pressure to goal.  We will check in on your blood pressure in a couple weeks and determine if any additional changes are needed at that time.

## 2019-11-04 NOTE — Progress Notes (Signed)
Virtual Visit via Telephone Note   This visit type was conducted due to national recommendations for restrictions regarding the COVID-19 Pandemic (e.g. social distancing) in an effort to limit this patient's exposure and mitigate transmission in our community.  Due to his co-morbid illnesses, this patient is at least at moderate risk for complications without adequate follow up.  This format is felt to be most appropriate for this patient at this time.  The patient did not have access to video technology/had technical difficulties with video requiring transitioning to audio format only (telephone).  All issues noted in this document were discussed and addressed.  No physical exam could be performed with this format.  Please refer to the patient's chart for his  consent to telehealth for [facility].    Date:  11/04/2019   ID:  Luis Mcknight, DOB 20-Apr-1980, MRN [id number] The patient was identified using 2 identifiers.  Patient Location: Home Provider Location: Office/Clinic  PCP:  Ferrel Logan, PA-C  Cardiologist:  Nelva Bush, MD  Electrophysiologist:  None   Evaluation Performed:  Follow-Up Visit  Chief Complaint: HTN follow up  History of Present Illness:    Luis Mcknight is a 39 y.o. male with sarcoidisis, pericarditis, HTN, arthritis, GERD. He was last seen 10/04/19.  He was seen in clinic 05/2019 doing overall well with improvement in chest pain and occasional transient chest discomfort. 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 Seen 09/04/19 with worsening left sided chest pain. He had run out of his prednisone. Chest pain was not consistent with angina or acute pericarditis with likely etiology musculoskeletal vs pleurisy. He was recommended for naproxen for one week and famotidine. Recommended continuing colchicine to complete 6 month course and then taper.  He called the office one  week later reporting headache, swelling of temples, rash. He was recommended to stop naproxen and famotidine though symptoms were atypical for reaction to these medications.    Seen in clinic 10/04/19. Noted to have discontinued his colchicine 3 weeks prior without taper and no recurrent chest discomfort. He also noted "gas pain" relieved by pepto bismol or Gas-X. He was given Rx for Pantoprazole 20mg  daily. He was also started on Losartan 25mg  daily for elevated BP.   Reports improvement in indigestion since addition of Protonix. Reports no recurrent chest pain, pressure, tightness. Reports no shortness of breath nor dyspnea on exertion. BP at home routinely 130s/80s. Has been on increased dose Losartan 50mg  as recommended by PCP for approximately one week. No low BP readings nor lightheadedness. Reports his left side pain which he attributes to sarcoidosis is stable at baseline. Has upcoming follow up with pulmonology.   The patient does not have symptoms concerning for COVID-19 infection (fever, chills, cough, or new shortness of breath).    Past Medical History:  Diagnosis Date  . Arthritis   . Depression    NO MEDS  . GERD (gastroesophageal reflux disease)    NO MEDS  . Hypertension   . Pneumonia    JUNE 2020   Past Surgical History:  Procedure Laterality Date  . ELECTROMAGNETIC NAVIGATION BROCHOSCOPY Left 10/08/2018   Procedure: ELECTROMAGNETIC NAVIGATION BRONCHOSCOPY, LEFT;  Surgeon: Tyler Pita, MD;  Location: [facility] ORS;  Service: Cardiopulmonary;  Laterality: Left;  . ENDOBRONCHIAL ULTRASOUND Left 10/08/2018   Procedure: ENDOBRONCHIAL ULTRASOUND, LEFT;  Surgeon: Tyler Pita, MD;  Location: [facility] ORS;  Service: Cardiopulmonary;  Laterality: Left;  . NO  PAST SURGERIES       No outpatient medications have been marked as taking for the 11/04/19 encounter (Appointment) with Loel Dubonnet, NP.     Allergies:   Latex   Social History   Tobacco Use  . Smoking status:  Former Smoker    Packs/day: 1.00    Years: 21.00    Pack years: 21.00    Types: Cigarettes    Start date: 03/07/1998    Quit date: 08/14/2019    Years since quitting: 0.2  . Smokeless tobacco: Never Used  . Tobacco comment: down to 6 cigarettes a day   Vaping Use  . Vaping Use: Former  . Start date: 04/18/2012  . Quit date: 04/18/2016  Substance Use Topics  . Alcohol use: Not Currently  . Drug use: No     Family Hx: The patient's family history includes Diabetes in his father and mother; Heart attack (age of onset: 20) in his father; Hypertension in his father and mother.  ROS:   Please see the history of present illness.    Review of Systems  Constitutional: Negative for chills, fever and malaise/fatigue.  Cardiovascular: Negative for chest pain, dyspnea on exertion, leg swelling, near-syncope, orthopnea, palpitations and syncope.  Respiratory: Negative for cough, shortness of breath and wheezing.   Gastrointestinal: Positive for heartburn ("getting better"). Negative for nausea and vomiting.  Neurological: Negative for dizziness, light-headedness and weakness.   All other systems reviewed and are negative.  Prior CV studies:   The following studies were reviewed today:  None.  Labs/Other Tests and Data Reviewed:    EKG:  An ECG dated 10/04/19 was personally reviewed today and demonstrated:  NSR 74 bpm with occasional PAC  Recent Labs: 01/01/2019: ALT 52; Hemoglobin 14.7; Platelets 237 04/03/2019: BUN 16; Creatinine, Ser 0.98; Potassium 4.4; Sodium 141   Recent Lipid Panel No results found for: CHOL, TRIG, HDL, CHOLHDL, LDLCALC, LDLDIRECT  Wt Readings from Last 3 Encounters:  10/04/19 (!) 247 lb (112 kg)  09/26/19 247 lb (112 kg)  09/04/19 246 lb 6 oz (111.8 kg)     Objective:    Vital Signs:  There were no vitals taken for this visit.   VITAL SIGNS:  reviewed  ASSESSMENT & PLAN:    1. Hx of pericarditis - Has been off colchicine for approx 2 months without  recurrent chest discomfort. No further intervention at this time.  2. GERD - Symptoms improved since addition of Protonix 20mg  daily, continue.   3. Sarcoidosis - Upcoming follow up with pulmonology. Continues on prednisone.  4. HTN - BP improved since addition of Losartan and subsequent up-titration by PCP. Continue Losartan 50mg  daily and Amlodipine 10mg  daily. Will check in via MyChart message in 2 weeks as he has only been on increased Losartan dose a short while. If BP not at goal of <130/80, will plan to increase Losartan at that time. Low sodium, heart healthy diet and regular cardiovascular exercise encouraged.  5. OSA - CPAP compliance encouraged.  6. Tobacco use - Continues to work on quitting. Congratulated on progress. Smoking cessation encouraged. Recommend utilization of 1800QUITNOW.   Time:   Today, I have spent 15 minutes with the patient with telehealth technology discussing the above problems.     Medication Adjustments/Labs and Tests Ordered: Current medicines are reviewed at length with the patient today.  Concerns regarding medicines are outlined above.   Tests Ordered: No orders of the defined types were placed in this encounter.   Medication  Changes: No orders of the defined types were placed in this encounter.   Follow Up:  In Person in 3 month(s) with Dr. Saunders Revel or APP  Signed, Loel Dubonnet, NP  11/04/2019 8:24 AM    [place]

## 2019-11-12 ENCOUNTER — Other Ambulatory Visit: Payer: Self-pay | Admitting: Pulmonary Disease

## 2019-11-13 DIAGNOSIS — M47812 Spondylosis without myelopathy or radiculopathy, cervical region: Secondary | ICD-10-CM | POA: Diagnosis not present

## 2019-11-13 DIAGNOSIS — M47816 Spondylosis without myelopathy or radiculopathy, lumbar region: Secondary | ICD-10-CM | POA: Diagnosis not present

## 2019-11-19 ENCOUNTER — Other Ambulatory Visit: Payer: Self-pay

## 2019-11-19 ENCOUNTER — Ambulatory Visit
Admission: RE | Admit: 2019-11-19 | Discharge: 2019-11-19 | Disposition: A | Discharge status: Home / Self Care | Payer: Medicaid Other | Source: Ambulatory Visit | Attending: Pulmonary Disease | Admitting: Pulmonary Disease

## 2019-11-19 ENCOUNTER — Ambulatory Visit
Admission: RE | Admit: 2019-11-19 | Discharge: 2019-11-19 | Disposition: A | Discharge status: Home / Self Care | Payer: Medicaid Other | Attending: Pulmonary Disease | Admitting: Pulmonary Disease

## 2019-11-19 ENCOUNTER — Encounter: Payer: Self-pay | Admitting: Pulmonary Disease

## 2019-11-19 ENCOUNTER — Ambulatory Visit (INDEPENDENT_AMBULATORY_CARE_PROVIDER_SITE_OTHER): Payer: Medicaid Other | Admitting: Pulmonary Disease

## 2019-11-19 ENCOUNTER — Encounter: Payer: Self-pay | Admitting: Family

## 2019-11-19 VITALS — BP 146/100 | HR 99 | Temp 98.0°F | Ht 73.0 in | Wt 246.2 lb

## 2019-11-19 DIAGNOSIS — R079 Chest pain, unspecified: Secondary | ICD-10-CM | POA: Diagnosis not present

## 2019-11-19 DIAGNOSIS — I10 Essential (primary) hypertension: Secondary | ICD-10-CM | POA: Diagnosis not present

## 2019-11-19 DIAGNOSIS — D869 Sarcoidosis, unspecified: Secondary | ICD-10-CM

## 2019-11-19 DIAGNOSIS — Z0000 Encounter for general adult medical examination without abnormal findings: Secondary | ICD-10-CM | POA: Diagnosis not present

## 2019-11-19 DIAGNOSIS — F17200 Nicotine dependence, unspecified, uncomplicated: Secondary | ICD-10-CM

## 2019-11-19 DIAGNOSIS — G4733 Obstructive sleep apnea (adult) (pediatric): Secondary | ICD-10-CM | POA: Diagnosis not present

## 2019-11-19 NOTE — Assessment & Plan Note (Signed)
Plan: Continue prednisone 10 mg Chest x-ray today May need to consider prednisone taper We will have 4-week follow-up with Dr. Patsey Berthold, may need to consider additional steroid sparing agent

## 2019-11-19 NOTE — Patient Instructions (Addendum)
You were seen today by Lauraine Rinne, NP  for:   1. Sarcoidosis  Continue prednisone 10 mg daily  Chest x-ray today  If you continue to have bouts of atypical chest pain we may need to consider repeat echocardiogram this is similar to what she had in October/2020  We will see you back in close follow-up in 4 weeks with Dr. Patsey Berthold  2. OSA (obstructive sleep apnea)  You have severe obstructive sleep apnea and your CPAP compliance report shows poor compliance  I do not believe it would be an appropriate intervention for you to start an oral appliance for management of obstructive sleep apnea given your TMJ as well as your severe obstructive sleep apnea  Would recommend resuming CPAP therapy  We will place an order for a mask fitting in the sleep lab  If we continue to be intolerant with CPAP therapy may need to consider inspire device which would require a referral to an ear nose and throat specialist Dr. Redmond Baseman  We recommend that you continue using your CPAP daily >>>Keep up the hard work using your device >>> Goal should be wearing this for the entire night that you are sleeping, at least 4 to 6 hours  Remember:  . Do not drive or operate heavy machinery if tired or drowsy.  . Please notify the supply company and office if you are unable to use your device regularly due to missing supplies or machine being broken.  . Work on maintaining a healthy weight and following your recommended nutrition plan  . Maintain proper daily exercise and movement  . Maintaining proper use of your device can also help improve management of other chronic illnesses such as: Blood pressure, blood sugars, and weight management.   BiPAP/ CPAP Cleaning:  >>>Clean weekly, with Dawn soap, and bottle brush.  Set up to air dry. >>> Wipe mask out daily with wet wipe or towelette    3. Essential hypertension  Please ensure that you have updated primary care as well as cardiology that your blood pressure  remains elevated  4. Chest pain of uncertain etiology  Keep follow-up with cardiology  Chest x-ray today  If pain continues to persist could consider repeat echocardiogram  5. Current smoker  We recommend that you stop smoking.  >>>You need to set a quit date >>>If you have friends or family who smoke, let them know you are trying to quit and not to smoke around you or in your living environment  Smoking Cessation Resources:  1 800 QUIT NOW  >>> Patient to call this resource and utilize it to help support her quit smoking >>> Keep up your hard work with stopping smoking  You can also contact the [facility] >>>For smoking cessation classes call [phone]  We do not recommend using e-cigarettes as a form of stopping smoking  You can sign up for smoking cessation support texts and information:  >>>https://smokefree.gov/smokefreetxt   6.  Health maintenance  We recommend the seasonal flu vaccine when available in fall/2021  We recommend that you obtain the COVID-19 vaccinations as you are at risk for pulmonary complications due to SEGBT-51 given the year sarcoidosis as well as your daily prednisone use    Follow Up:    Return in about 4 weeks (around 12/17/2019), or if symptoms worsen or fail to improve, for [facility] - Dr. Patsey Berthold.   Notification of test results are managed in the following manner: If there are  any recommendations  or changes to the  plan of care discussed in office today,  we will contact you and let you know what they are. If you do not hear from [place], then your results are normal and you can view them through your  MyChart account , or a letter will be sent to you. Thank you again for trusting [place] with your care  - Thank you, [facility]    It is flu season:   >>> Best ways to protect herself from the flu: Receive the yearly flu vaccine, practice good hand hygiene washing with soap and also using hand sanitizer when  available, eat a nutritious meals, get adequate rest, hydrate appropriately       Please contact the office if your symptoms worsen or you have concerns that you are not improving.   Thank you for choosing [facility] for your healthcare, and for allowing [place] to partner with you on your healthcare journey. I am thankful to be able to provide care to you today.   Wyn Quaker FNP-C

## 2019-11-19 NOTE — Progress Notes (Signed)
@Patient  ID: Luis Mcknight, male    DOB: April 04, 1980, 39 y.o.   MRN: [id number]  Chief Complaint  Patient presents with   Follow-up    Follow up for Sarcoidosis. Bought exercise bike and tries to do 10 minutes a day and is able to do it without stopping twice a day. Shortnness of breath with exertion. Cough with yellow sputum.    Referring provider: Kizzie Bane  HPI:  39 year old male current smoker followed in our office for sarcoidosis, severe obstructive sleep apnea and allergic rhinitis  Past medical history: Hypertension Smoking history: Current smoker. 4-5 cigarettes a day.  Maintenance: 10 mg daily prednisone Patient of Dr. Patsey Berthold  11/19/2019  - Visit   39 year old male current smoker followed in our office for sarcoidosis and severe obstructive sleep apnea.  He is followed in our office by Dr. Patsey Berthold as well as Dr. Halford Chessman for sleep.  He is presenting today as a follow-up visit.  He remains adherent to 10 mg of daily prednisone for management of sarcoid.  He has started CPAP therapy but he feels the CPAP may be giving him TMJ.  He is unsure if this is the case.  It was suggested by primary care as well as his dentist that he should try an oral appliance.  This was discussed with Dr. Elvera Bicker Dr. Halford Chessman did not feel that an oral appliance would be appropriate given the fact that he has TMJ as well as severe obstructive sleep apnea  Patient continues to have daily atypical chest pain.  He was last evaluated by cardiology Dr. Saunders Revel on 09/04/2019.  The assessment and plan from that visit is listed below:  ASSESSMENT AND PLAN: Chest pain: Pain is not consistent with angina or acute pericarditis.  I most suspicious for musculoskeletal etiology, though pleurisy is also a concern.  EKG today is normal.  I have suggested an empiric course of naproxen 500 mg twice daily x1 week as well as famotidine 20 mg twice daily.  If symptoms do not improve, Mr. Dettore should contact us or  seek immediate medical attention.  We will continue colchicine for now to complete a 67-month course of 0.6 mg twice daily.  At that point, I would recommend tapering off.  Sarcoidosis: I suspect many of Mr. Havery Moros pain complaints could be related to sarcoidosis.  He has a follow-up appointment with pulmonology (Dr. Halford Chessman), next month.  I have asked Mr. Reister to reach out to the pulmonary clinic in the meantime to determine if he needs to restart prednisone.  Hypertension: Blood pressure mildly elevated today in the setting of pain.  I will defer escalation of his current antihypertensive therapy consisting of amlodipine.  Follow-up: Return to clinic in 1 month.  Patient's blood pressure is also elevated today.  He is already notified primary care.  He has not yet had scheduled follow-up with cardiology.  He feels of the losartan that he is on for management of his blood pressure is not helping with his blood pressure.  We will discuss this today.  Patient continues to have a productive cough with yellow mucus.  Still having occasional dyspnea.  Unfortunately the patient does still continue to smoke 4 to 6 cigarettes a day.  He has not received the COVID-19 vaccinations.  CPAP compliance report listed below:  10/20/2019-11/18/2019-CPAP compliance report-6 at a last 30 days used, 6 of those days greater than 4 hours, average usage 7 hours and 3 minutes, APAP setting 8-12, AHI 0.5  Questionaires / Pulmonary Flowsheets:   ACT:  No flowsheet data found.  MMRC: No flowsheet data found.  Epworth:  No flowsheet data found.  Tests:   Pulmonary tests:   Quantiferon gold 09/27/18 >> negative  Bronchoscopy 10/08/18 >> non necrotizing granulomatous inflammation  PFT 11/14/18 >> FEV1 3.85 (94%), FEV1% 84, TLC 5.35 (71%), DLCO 61%  Chest imaging:   CT angio chest 03/27/18 >> nodular ASD in LUL, scoliosis  CT chest 09/27/18 >> enlarged LN, widespread micronodularity  CT chest 01/14/19 >>  mediastinal and b/l hilar LAN, nodularity  Sleep tests:   PSG 02/06/19 >> AHI 34.3, SpO2 low 88%  Auto CPAP 08/25/19 to 09/23/19 >> used on 13 of 30 nights with average 7 hrs 8 min.  Average AHI 0.3 with median CPAP 8 and 95 th percentile CPAP 10 cm H2O  Cardiac tests:  Echo 12/11/18 >> EF 60 to 65%, aortic root 38 mm  FENO:  No results found for: NITRICOXIDE  PFT: No flowsheet data found.  WALK:  No flowsheet data found.  Imaging: No results found.  Lab Results:  CBC    Component Value Date/Time   WBC 7.1 01/01/2019 1121   RBC 5.69 01/01/2019 1121   HGB 14.7 01/01/2019 1121   HCT 45.7 01/01/2019 1121   PLT 237 01/01/2019 1121   MCV 80.3 01/01/2019 1121   MCH 25.8 (L) 01/01/2019 1121   MCHC 32.2 01/01/2019 1121   RDW 14.2 01/01/2019 1121   LYMPHSABS 1.3 01/01/2019 1121   MONOABS 0.7 01/01/2019 1121   EOSABS 0.1 01/01/2019 1121   BASOSABS 0.0 01/01/2019 1121    BMET    Component Value Date/Time   NA 141 04/03/2019 1032   K 4.4 04/03/2019 1032   CL 103 04/03/2019 1032   CO2 23 04/03/2019 1032   GLUCOSE 95 04/03/2019 1032   GLUCOSE 102 (H) 01/01/2019 1121   BUN 16 04/03/2019 1032   CREATININE 0.98 04/03/2019 1032   CALCIUM 9.5 04/03/2019 1032   GFRNONAA 99 04/03/2019 1032   GFRAA 114 04/03/2019 1032    BNP    Component Value Date/Time   BNP 4.0 12/21/2017 2201    ProBNP No results found for: PROBNP  Specialty Problems      Pulmonary Problems   Lung nodules   OSA (obstructive sleep apnea)    Chart review states severe obstructive sleep apnea CPAP titration recommend CPAP set pressure of 11         Allergies  Allergen Reactions   Latex Itching    GLOVES     There is no immunization history on file for this patient.  Past Medical History:  Diagnosis Date   Arthritis    Depression    NO MEDS   GERD (gastroesophageal reflux disease)    NO MEDS   Hypertension    Pneumonia    JUNE 2020    Tobacco History: Social History     Tobacco Use  Smoking Status Former Smoker   Packs/day: 1.00   Years: 21.00   Pack years: 21.00   Types: Cigarettes   Start date: 03/07/1998  Smokeless Tobacco Never Used  Tobacco Comment   down to 6 cigarettes a day    Counseling given: Yes Comment: down to 6 cigarettes a day    Continue to not smoke  Outpatient Encounter Medications as of 11/19/2019  Medication Sig   acetaminophen (TYLENOL) 500 MG tablet Take 500-1,000 mg by mouth every 6 (six) hours as needed.   amLODipine (NORVASC) 10  MG tablet Take 10 mg by mouth every morning.    losartan (COZAAR) 50 MG tablet Take 1 tablet (50 mg total) by mouth daily.   pantoprazole (PROTONIX) 20 MG tablet Take 1 tablet (20 mg total) by mouth daily.   predniSONE (DELTASONE) 10 MG tablet Take 1 tablet (10 mg total) by mouth daily with breakfast.   No facility-administered encounter medications on file as of 11/19/2019.     Review of Systems  Review of Systems  Constitutional: Positive for fatigue. Negative for activity change, chills, fever and unexpected weight change.  HENT: Negative for postnasal drip, rhinorrhea, sinus pressure, sinus pain and sore throat.   Eyes: Negative.   Respiratory: Negative for cough, shortness of breath and wheezing.   Cardiovascular: Positive for chest pain. Negative for palpitations.  Gastrointestinal: Negative for constipation, diarrhea, nausea and vomiting.  Endocrine: Negative.   Genitourinary: Negative.   Musculoskeletal: Negative.   Skin: Negative.   Neurological: Negative for dizziness and headaches.  Psychiatric/Behavioral: Negative.  Negative for dysphoric mood. The patient is not nervous/anxious.   All other systems reviewed and are negative.    Physical Exam  BP (!) 146/100 (BP Location: Left Arm, Patient Position: Sitting, Cuff Size: Large)    Pulse 99    Temp 98 F (36.7 C) (Temporal)    Ht 6\' 1"  (1.854 m)    Wt 246 lb 3.2 oz (111.7 kg)    SpO2 98%    BMI 32.48 kg/m   Wt  Readings from Last 5 Encounters:  11/19/19 246 lb 3.2 oz (111.7 kg)  11/04/19 245 lb (111.1 kg)  10/04/19 (!) 247 lb (112 kg)  09/26/19 247 lb (112 kg)  09/04/19 246 lb 6 oz (111.8 kg)    BMI Readings from Last 5 Encounters:  11/19/19 32.48 kg/m  11/04/19 32.32 kg/m  10/04/19 34.45 kg/m  09/26/19 32.59 kg/m  09/04/19 32.51 kg/m     Physical Exam Vitals and nursing note reviewed.  Constitutional:      General: He is not in acute distress.    Appearance: Normal appearance. He is obese.  HENT:     Head: Normocephalic and atraumatic.     Right Ear: Hearing and external ear normal.     Left Ear: Hearing and external ear normal.     Nose: No mucosal edema.     Right Turbinates: Not enlarged.     Left Turbinates: Not enlarged.  Cardiovascular:     Rate and Rhythm: Normal rate and regular rhythm.     Pulses: Normal pulses.     Heart sounds: Normal heart sounds. No murmur heard.   Pulmonary:     Effort: Pulmonary effort is normal.     Breath sounds: Normal breath sounds. No decreased breath sounds, wheezing or rales.  Musculoskeletal:     Cervical back: Normal range of motion.     Right lower leg: No edema.     Left lower leg: No edema.  Lymphadenopathy:     Cervical: No cervical adenopathy.  Skin:    General: Skin is warm and dry.     Capillary Refill: Capillary refill takes less than 2 seconds.     Findings: No erythema or rash.  Neurological:     General: No focal deficit present.     Mental Status: He is alert and oriented to person, place, and time.     Motor: No weakness.     Coordination: Coordination normal.     Gait: Gait is intact. Gait normal.  Psychiatric:        Mood and Affect: Mood normal.        Behavior: Behavior normal. Behavior is cooperative.        Thought Content: Thought content normal.        Judgment: Judgment normal.       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 Patsey Berthold  Sarcoidosis Plan: Continue prednisone 10 mg Chest x-ray today May need to consider prednisone taper We will have 4-week follow-up with Dr. Patsey Berthold, may need to consider additional steroid sparing agent  Current smoker Plan: Emphasized the patient today that he needs to stop smoking  Healthcare maintenance Plan: Recommend seasonal flu vaccine in fall/2021 Recommend the patient obtain COVID-19 vaccinations Emphasized need to stop smoking  Chest pain of uncertain etiology Plan: Continue to work with cardiology Resume CPAP use If atypical chest pain continues to persist could consider repeat echocardiogram Chest x-ray today    Return in about 4 weeks (around 12/17/2019), or if symptoms worsen or fail to improve, for [facility] - Dr. Patsey Berthold.   Lauraine Rinne, NP 11/19/2019   This appointment required 41 minutes of patient care (this includes precharting, chart review, review of results, face-to-face care, etc.).

## 2019-11-19 NOTE — Progress Notes (Signed)
Reviewed and agree with assessment/plan.   Chesley Mires, MD [facility] 11/19/2019, 2:41 PM Pager:  [phone]

## 2019-11-19 NOTE — Assessment & Plan Note (Signed)
Mildly elevated on exam today Patient follows with primary care and cardiology Patient is already notified primary care  Plan: Continue to work with primary care for management of hypertension Continue to follow-up with cardiology

## 2019-11-19 NOTE — Assessment & Plan Note (Signed)
Plan: Emphasized the patient today that he needs to stop smoking

## 2019-11-19 NOTE — Assessment & Plan Note (Signed)
Plan: Recommend seasonal flu vaccine in fall/2021 Recommend the patient obtain COVID-19 vaccinations Emphasized need to stop smoking

## 2019-11-19 NOTE — Assessment & Plan Note (Signed)
Plan: Continue to work with cardiology Resume CPAP use If atypical chest pain continues to persist could consider repeat echocardiogram Chest x-ray today

## 2019-11-19 NOTE — Assessment & Plan Note (Signed)
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 Patsey Berthold

## 2019-11-20 DIAGNOSIS — M9901 Segmental and somatic dysfunction of cervical region: Secondary | ICD-10-CM | POA: Diagnosis not present

## 2019-11-20 DIAGNOSIS — M9902 Segmental and somatic dysfunction of thoracic region: Secondary | ICD-10-CM | POA: Diagnosis not present

## 2019-11-20 DIAGNOSIS — M9903 Segmental and somatic dysfunction of lumbar region: Secondary | ICD-10-CM | POA: Diagnosis not present

## 2019-11-27 ENCOUNTER — Other Ambulatory Visit: Payer: Self-pay | Admitting: Pulmonary Disease

## 2019-11-27 DIAGNOSIS — Z20822 Contact with and (suspected) exposure to covid-19: Secondary | ICD-10-CM | POA: Diagnosis not present

## 2019-11-27 DIAGNOSIS — R519 Headache, unspecified: Secondary | ICD-10-CM | POA: Diagnosis not present

## 2019-11-27 DIAGNOSIS — R0602 Shortness of breath: Secondary | ICD-10-CM | POA: Diagnosis not present

## 2019-11-27 DIAGNOSIS — R079 Chest pain, unspecified: Secondary | ICD-10-CM | POA: Diagnosis not present

## 2019-11-29 ENCOUNTER — Other Ambulatory Visit: Payer: Self-pay | Admitting: Family

## 2019-12-05 ENCOUNTER — Other Ambulatory Visit (HOSPITAL_BASED_OUTPATIENT_CLINIC_OR_DEPARTMENT_OTHER): Payer: Medicaid Other | Admitting: Internal Medicine

## 2019-12-10 DIAGNOSIS — G4733 Obstructive sleep apnea (adult) (pediatric): Secondary | ICD-10-CM | POA: Diagnosis not present

## 2019-12-18 ENCOUNTER — Ambulatory Visit (INDEPENDENT_AMBULATORY_CARE_PROVIDER_SITE_OTHER): Payer: Medicaid Other | Admitting: Pulmonary Disease

## 2019-12-18 ENCOUNTER — Encounter: Payer: Self-pay | Admitting: Pulmonary Disease

## 2019-12-18 ENCOUNTER — Other Ambulatory Visit: Payer: Self-pay

## 2019-12-18 VITALS — BP 140/80 | HR 83 | Temp 96.9°F | Ht 73.0 in | Wt 247.2 lb

## 2019-12-18 DIAGNOSIS — F17210 Nicotine dependence, cigarettes, uncomplicated: Secondary | ICD-10-CM | POA: Diagnosis not present

## 2019-12-18 DIAGNOSIS — D869 Sarcoidosis, unspecified: Secondary | ICD-10-CM | POA: Diagnosis not present

## 2019-12-18 DIAGNOSIS — G4733 Obstructive sleep apnea (adult) (pediatric): Secondary | ICD-10-CM

## 2019-12-18 DIAGNOSIS — D8686 Sarcoid arthropathy: Secondary | ICD-10-CM | POA: Diagnosis not present

## 2019-12-18 NOTE — Progress Notes (Signed)
Subjective:    Patient ID: Luis Mcknight, male    DOB: 1980-11-29, 39 y.o.   MRN: [id number]  HPI  [name] is a 39 year old current smoker, who presents for follow-up on sarcoidosis.  Seen on initial consultation here on 02 October 2018 by Dr. Merton Border.  I assumed care of the patient after Dr. Alva Garnet departure from the practice.  I last saw the patient in February 2021, he has been following up with cardiology and sleep medicine here.  He does have severe obstructive sleep apnea for which he is on CPAP at 11 cm water pressure.  He currently states that he is compliant after having had a brief hiatus from using the CPAP.  He was seen by Warner Mccreedy our nurse practitioner on 19 November 2019.  A chest x-ray was ordered at that time that shows complete resolution of his left upper lobe process previously biopsied during bronchoscopy.  ACE level was normal in October 2020.  He is on prednisone at 10 mg daily.  He continues to have issues with arthralgias and myalgias as well as Raynaud's phenomena.  I suspect he has sarcoid arthritis.  From the pulmonary standpoint however his sarcoidosis is responding to therapy.  He has not had any fevers, chills or sweats.  He unfortunately continues to smoke anywhere between one quarter to 1/2  pack of cigarettes per day.  DATA:  03/27/2018 CT angio scan of the chest no PE, nodular areas of consolidation in the left upper lobe, mediastinal and hilar adenopathy  07/23/2020CT scan of the chest progressive nodularity throughout the left upper lobe with associated mediastinal and bilateral hilar lymphadenopathy  09/27/2018 QuantiFERON gold negative  10/08/2018 bronchoscopy with navigational assistance and endobronchial ultrasound findings consistent with sarcoidosis, Burkholderia cepacia infection (treated)  11/14/2018 pulmonary function testing relatively normal FEV1 3.85 L or 94% predicted. FEV1/FVC 84%, DLCO 61%  10/06/2020echocardiogram essentially  normal  01/01/2019 ACE level normal at 34 U/L  11/09/2020repeat CT scan chest, improvement on previously read parenchymal nodules and coalescent infiltrates as well as improvement in mediastinal adenopathy  02/08/2019 sleep study AHI 34.3, SPO2 low 88%  08/25/2019 auto CPAP titration to 11 cm H2O  04/29/2019 cardiac MRI, normal with no evidence of sarcoid  11/19/2019 clearing of the previously noted left upper lobe infiltration the only mild scarring left, no adenopathy noted  Review of Systems A 10 point review of systems was performed and it is as noted above otherwise negative.  Allergies  Allergen Reactions   Latex Itching    GLOVES   Current Meds  Medication Sig   acetaminophen (TYLENOL) 500 MG tablet Take 500-1,000 mg by mouth every 6 (six) hours as needed.   amLODipine (NORVASC) 10 MG tablet Take 10 mg by mouth every morning.    fluticasone (FLONASE) 50 MCG/ACT nasal spray SHAKE LIQUID AND USE 1 SPRAY IN EACH NOSTRIL TWICE DAILY   losartan (COZAAR) 50 MG tablet Take 1 tablet (50 mg total) by mouth daily.   pantoprazole (PROTONIX) 20 MG tablet TAKE 1 TABLET(20 MG) BY MOUTH DAILY   predniSONE (DELTASONE) 10 MG tablet TAKE 1 TABLET(10 MG) BY MOUTH DAILY WITH BREAKFAST    There is no immunization history on file for this patient.     Objective:   Physical Exam BP 140/80 (BP Location: Left Arm, Patient Position: Sitting, Cuff Size: Normal)    Pulse 83    Temp (!) 96.9 F (36.1 C) (Temporal)    Ht 6\' 1"  (1.854 m)    Wt  247 lb 3.2 oz (112.1 kg)    SpO2 99%    BMI 32.61 kg/m  GENERAL: Well-developed, well-nourished distress, fully ambulatory. HEAD: Normocephalic, atraumatic.  EYES: Pupils equal, round, reactive to light.  No scleral icterus.  MOUTH: Nose/mouth/throat not examined due to masking requirements for COVID 19. NECK: Supple. No thyromegaly. Trachea midline. No JVD.  No adenopathy. PULMONARY: Good air entry bilaterally.  No adventitious  sounds. CARDIOVASCULAR: S1 and S2.  Sinus arrhythmia.  No rubs, murmurs or gallops heard. ABDOMEN: Benign MUSCULOSKELETAL: No joint deformity, no clubbing, no edema.  NEUROLOGIC: No focal deficit noted, speech is fluent, no gait disturbance. SKIN: Intact,warm,dry. PSYCH: Mood and behavior are normal.  Chest x-ray performed 19 November 2019 independently reviewed, marked improvement on previously noted sarcoidosis changes:       Assessment & Plan:     ICD-10-CM   1. Sarcoidosis  D86.9    Patient continues to be on 10 mg of prednisone daily From the pulmonary standpoint markedly improved Consider decreasing steroids after rheumatology eval  2. Sarcoid arthritis  D86.86 Ambulatory referral to Rheumatology   Polyarthralgias myalgias We will have rheumatology weigh in Referral to [facility] at [facility]  3. OSA (obstructive sleep apnea)  G47.33    On CPAP at 11 cm water pressure Followed by Dr. Halford Chessman sleep medicine Has been compliant since September visit  4. Tobacco dependence due to cigarettes  F17.210    Patient counseled regards discontinuation of smoking   Orders Placed This Encounter  Procedures   Ambulatory referral to Rheumatology    Referral Priority:   Routine    Referral Type:   Consultation    Referral Reason:   Specialty Services Required    Requested Specialty:   Rheumatology    Number of Visits Requested:   1   Discussion:  From the pulmonary standpoint the patient's radiographic picture of markedly improved after therapy with prednisone.  He is down to 10 mg daily will defer decreasing further until he has a rheumatology evaluation for his arthralgias/myalgias.  He may need other controller medications for his arthritis.  Suspect sarcoid arthritis.  Continues to follow-up with cardiology but has no evidence of cardiac sarcoidosis.  He is to continue CPAP as sleep medicine.  We will see him in follow-up in 3 months time he is to contact us prior to that time should  any new difficulties arise.  Renold Don, MD [facility]   *This note was dictated using voice recognition software/Dragon.  Despite best efforts to proofread, errors can occur which can change the meaning.  Any change was purely unintentional.

## 2019-12-18 NOTE — Patient Instructions (Signed)
Continue prednisone at 10 mg daily for now.  We are going to refer you to [facility] rheumatology clinic  We will see you in follow-up in 3 months time call sooner should any new difficulties arise.

## 2020-01-03 ENCOUNTER — Ambulatory Visit (INDEPENDENT_AMBULATORY_CARE_PROVIDER_SITE_OTHER): Payer: Medicaid Other

## 2020-01-03 ENCOUNTER — Ambulatory Visit
Admission: EM | Admit: 2020-01-03 | Discharge: 2020-01-03 | Disposition: A | Discharge status: Home / Self Care | Payer: Medicaid Other | Attending: Physician Assistant | Admitting: Physician Assistant

## 2020-01-03 ENCOUNTER — Encounter: Payer: Self-pay | Admitting: Emergency Medicine

## 2020-01-03 ENCOUNTER — Other Ambulatory Visit: Payer: Self-pay

## 2020-01-03 DIAGNOSIS — I10 Essential (primary) hypertension: Secondary | ICD-10-CM | POA: Diagnosis not present

## 2020-01-03 DIAGNOSIS — R0789 Other chest pain: Secondary | ICD-10-CM | POA: Diagnosis not present

## 2020-01-03 DIAGNOSIS — R059 Cough, unspecified: Secondary | ICD-10-CM

## 2020-01-03 DIAGNOSIS — G4733 Obstructive sleep apnea (adult) (pediatric): Secondary | ICD-10-CM | POA: Insufficient documentation

## 2020-01-03 DIAGNOSIS — F17210 Nicotine dependence, cigarettes, uncomplicated: Secondary | ICD-10-CM | POA: Diagnosis not present

## 2020-01-03 DIAGNOSIS — R079 Chest pain, unspecified: Secondary | ICD-10-CM | POA: Diagnosis not present

## 2020-01-03 DIAGNOSIS — Z8249 Family history of ischemic heart disease and other diseases of the circulatory system: Secondary | ICD-10-CM | POA: Diagnosis not present

## 2020-01-03 DIAGNOSIS — Z20822 Contact with and (suspected) exposure to covid-19: Secondary | ICD-10-CM | POA: Diagnosis not present

## 2020-01-03 DIAGNOSIS — D869 Sarcoidosis, unspecified: Secondary | ICD-10-CM | POA: Diagnosis not present

## 2020-01-03 DIAGNOSIS — Z79899 Other long term (current) drug therapy: Secondary | ICD-10-CM | POA: Diagnosis not present

## 2020-01-03 DIAGNOSIS — J22 Unspecified acute lower respiratory infection: Secondary | ICD-10-CM

## 2020-01-03 DIAGNOSIS — Z7952 Long term (current) use of systemic steroids: Secondary | ICD-10-CM | POA: Diagnosis not present

## 2020-01-03 MED ORDER — AZITHROMYCIN 250 MG PO TABS: 250.0000 mg | ORAL_TABLET | Freq: Every day | ORAL | 0 refills | Status: DC

## 2020-01-03 NOTE — ED Triage Notes (Signed)
Patient c/o off and on pain from his left elbow down his arm for years.  Patient states that it has recently gotten worse.  Patient also reports cough and yellow sputum for the past couple of weeks.  Patient denies any chest pain at this time.  Patient denies SOB at this time.  Patient denies fevers.

## 2020-01-03 NOTE — ED Provider Notes (Signed)
[facility]    CSN: [id number] Arrival date & time: 01/03/20  0809      History   Chief Complaint Chief Complaint  Patient presents with  . Arm Pain    left   . Cough  . Chest Pain    HPI Luis Mcknight is a 39 y.o. male presenting for cough productive of yellowish sputum for the past "week or 2."  Patient also states that he has intermittent central and left-sided chest pains with occasional radiation to the left arm.  This has been present for the last couple of months and he has followed up with a couple of specialist for this including cardiology and pulmonology.  Patient denies any current chest pain.  He also denies any dizziness, palpitations or weakness.  Denies sweats.  Patient states that he has had similar symptoms in the past and was told he had a "respiratory infection."  Patient denies known Covid exposure and not vaccinated for COVID.  He denies any fever, fatigue, nasal congestion, sore throat, breathing difficulty, abdominal pain, nausea, vomiting, diarrhea.  No smell or taste changes.   Past medical history significant for sarcoidosis, tobacco use disorder, obstructive sleep apnea, and hypertension.  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  Patient not found to have any involvement of his heart.  His sarcoidosis is managed by daily prednisone of 10 mg.  He says he also has an inhaler to use as needed, but does not like to take it because it increases heart rate.  He has been taking over-the-counter Coricidin HBP for cough/cold symptoms.  Patient denies any worsening of symptoms, but says he is not improved.  He denies any other complaints or concerns today.  HPI  Past Medical History:  Diagnosis Date  . Arthritis   . Depression    NO MEDS  . GERD (gastroesophageal reflux disease)    NO MEDS  . Hypertension   . Pneumonia    JUNE 2020    Patient Active Problem List   Diagnosis  Date Noted  . Chest pain of uncertain etiology 27/78/2423  . OSA (obstructive sleep apnea) 06/04/2019  . Current smoker 06/04/2019  . Healthcare maintenance 06/04/2019  . Acute pericarditis 04/03/2019  . Sarcoidosis 10/16/2018  . Lung nodules   . Adenopathy   . Essential hypertension 11/28/2017    Past Surgical History:  Procedure Laterality Date  . ELECTROMAGNETIC NAVIGATION BROCHOSCOPY Left 10/08/2018   Procedure: ELECTROMAGNETIC NAVIGATION BRONCHOSCOPY, LEFT;  Surgeon: Tyler Pita, MD;  Location: [facility] ORS;  Service: Cardiopulmonary;  Laterality: Left;  . ENDOBRONCHIAL ULTRASOUND Left 10/08/2018   Procedure: ENDOBRONCHIAL ULTRASOUND, LEFT;  Surgeon: Tyler Pita, MD;  Location: [facility] OR[facility];  Service: Cardiopulmonary;  Laterality: Left;  . NO PAST SURGERIES         Home Medications    Prior to Admission medications   Medication Sig Start Date End Date Taking? Authorizing Provider  acetaminophen (TYLENOL) 500 MG tablet Take 500-1,000 mg by mouth every 6 (six) hours as needed.   Yes [provider]  amLODipine (NORVASC) 10 MG tablet Take 10 mg by mouth every morning.  03/19/18  Yes [provider]  fluticasone (FLONASE) 50 MCG/ACT nasal spray SHAKE LIQUID AND USE 1 SPRAY IN EACH NOSTRIL TWICE DAILY 11/28/19  Yes Tyler Pita, MD  losartan (COZAAR) 50 MG tablet Take 1 tablet (50 mg total) by mouth daily. 11/04/19  Yes Loel Dubonnet, NP  pantoprazole (PROTONIX) 20 MG tablet TAKE 1 TABLET(20 MG) BY MOUTH DAILY 11/29/19  Yes Loel Dubonnet, NP  predniSONE (DELTASONE) 10 MG tablet TAKE 1 TABLET(10 MG) BY MOUTH DAILY WITH BREAKFAST 11/28/19  Yes Tyler Pita, MD  azithromycin (ZITHROMAX) 250 MG tablet Take 1 tablet (250 mg total) by mouth daily. Take first 2 tablets together, then 1 every day until finished. 01/03/20   Danton Clap, PA-C    Family History Family History  Problem Relation Age of Onset  . Hypertension Mother   . Diabetes Mother    . Diabetes Father   . Hypertension Father   . Heart attack Father 26    Social History Social History   Tobacco Use  . Smoking status: Current Some Day Smoker    Packs/day: 1.00    Years: 21.00    Pack years: 21.00    Types: Cigarettes    Start date: 03/07/1998  . Smokeless tobacco: Never Used  . Tobacco comment: down to 6 cigarettes a day   Vaping Use  . Vaping Use: Former  . Start date: 04/18/2012  . Quit date: 04/18/2016  Substance Use Topics  . Alcohol use: Not Currently  . Drug use: No     Allergies   Latex   Review of Systems Review of Systems  Constitutional: Negative for fatigue and fever.  HENT: Positive for congestion. Negative for rhinorrhea, sinus pressure, sinus pain and sore throat.   Respiratory: Positive for cough. Negative for chest tightness, shortness of breath and wheezing.   Cardiovascular: Positive for chest pain (not currently, is chronic and intermittent). Negative for palpitations.  Gastrointestinal: Negative for abdominal pain, diarrhea, nausea and vomiting.  Musculoskeletal: Positive for arthralgias. Negative for myalgias.  Neurological: Negative for dizziness, weakness, light-headedness and headaches.  Hematological: Negative for adenopathy.     Physical Exam Triage Vital Signs ED Triage Vitals  Enc Vitals Group     BP 01/03/20 0839 (!) 143/102     Pulse Rate 01/03/20 0839 75     Resp 01/03/20 0839 16     Temp 01/03/20 0839 98.4 F (36.9 C)     Temp Source 01/03/20 0839 Oral     SpO2 01/03/20 0839 99 %     Weight 01/03/20 0836 245 lb (111.1 kg)     Height 01/03/20 0836 6\' 1"  (1.854 m)     Head Circumference --      Peak Flow --      Pain Score 01/03/20 0836 6     Pain Loc --      Pain Edu? --      Excl. in [place]? --    No data found.  Updated Vital Signs BP (!) 143/102 (BP Location: Right Arm)   Pulse 75   Temp 98.4 F (36.9 C) (Oral)   Resp 16   Ht 6\' 1"  (1.854 m)   Wt 245 lb (111.1 kg)   SpO2 99%   BMI 32.32 kg/m        Physical Exam Vitals and nursing note reviewed.  Constitutional:      General: He is not in acute distress.    Appearance: Normal appearance. He is well-developed. He is not ill-appearing or toxic-appearing.  HENT:     Head: Normocephalic and atraumatic.  Eyes:     General: No scleral icterus.    Conjunctiva/sclera: Conjunctivae normal.  Cardiovascular:     Rate and Rhythm: Normal rate and regular rhythm.     Heart sounds: Normal heart  sounds. No murmur heard.   Pulmonary:     Effort: Pulmonary effort is normal. No respiratory distress.     Breath sounds: Normal breath sounds. No wheezing, rhonchi or rales.  Chest:     Chest wall: No tenderness.  Musculoskeletal:     Cervical back: Neck supple.  Skin:    General: Skin is warm and dry.  Neurological:     General: No focal deficit present.     Mental Status: He is alert and oriented to person, place, and time. Mental status is at baseline.     Motor: No weakness.     Gait: Gait normal.  Psychiatric:        Mood and Affect: Mood normal.        Behavior: Behavior normal.        Thought Content: Thought content normal.      UC Treatments / Results  Labs (all labs ordered are listed, but only abnormal results are displayed) Labs Reviewed  SARS CORONAVIRUS 2 (TAT 6-24 HRS)    EKG   Radiology DG Chest 2 View  Result Date: 01/03/2020 CLINICAL DATA:  Chest pain, cough, sarcoidosis EXAM: CHEST - 2 VIEW COMPARISON:  11/19/2019 FINDINGS: The heart size and mediastinal contours are within normal limits. No new consolidation or edema. No pleural effusion or pneumothorax. Is the visualized skeletal structures are unremarkable. IMPRESSION: No acute process in the chest. Electronically Signed   By: Macy Mis M.D.   On: 01/03/2020 09:26    Procedures ED EKG  Date/Time: 01/03/2020 9:07 AM Performed by: Danton Clap, PA-C Authorized by: Danton Clap, PA-C   ECG reviewed by ED Physician in the absence of a  cardiologist: yes   Previous ECG:    Previous ECG:  Compared to current   Comparison ECG info:  Compared to EKGs from June and July 2021   Similarity:  No change Interpretation:    Interpretation: normal   Rate:    ECG rate:  79   ECG rate assessment: normal   Rhythm:    Rhythm: sinus rhythm   Ectopy:    Ectopy: none   QRS:    QRS axis:  Normal   QRS intervals:  Normal Conduction:    Conduction: normal   ST segments:    ST segments:  Normal T waves:    T waves: normal   Comments:     Sinus arrhythmia, regular rhythm. No ST-T wave changes   (including critical care time)  Medications Ordered in UC Medications - No data to display  Initial Impression / Assessment and Plan / UC Course  I have reviewed the triage vital signs and the nursing notes.  Pertinent labs & imaging results that were available during my care of the patient were reviewed by me and considered in my medical decision making (see chart for details).    39 year old male with history of sarcoidosis presenting for intermittent chest pains for the past couple of months.  He has been following up with a specialist for this and denies any pain currently.  Patient states that he has a productive cough over the past week or 2.  Patient concern for "respiratory infection."  Vital signs are stable.  Chest is clear to auscultation.  Oxygen 99%.  Blood pressure elevated at 143/102.  I have reviewed office notes from patient's pulmonologist Dr. Vernard Gambles from 12/18/2019.  She noted that there were no concerns for cardiac issues and his chronic chest pain likely due  to "sarcoid arthritis.".  Patient has been referred to rheumatology and is awaiting appointment.  Patient does follow-up with pulmonologist and cardiologist regularly.  EKG performed today and compared to EKGs from June and July 2021.  EKG without any changes.  EKG shows sinus arrhythmia and regular rate.  No ST or T wave changes.  Patient denies chest pain  currently.  Chest x-ray performed as well.  Chest x-ray is within normal limits.  Patient states that he has had this type of infection previously and was treated with azithromycin which seemed to help.  Advised patient that he likely does have a viral infection and it can take a couple weeks to get better.  Advised him to continue his prednisone and Coricidin HBP as well as increasing rest and fluid intake.  Will treat patient with azithromycin at this time due to his underlying conditions including sarcoidosis, sleep apnea, and tobacco abuse.  Advised him to follow-up with PCP about smoking cessation and to consider Zyban.  After reviewing the notes from the patient's pulmonologist and his exam as well as EKG and chest x-ray today his chest pain is not any different he does not have any current chest pain.  There is extremely low concern for any cardiac problems at this time.  Advised patient that if he does have any return of chest pain that is severe and different from any normal experiences along with any new or worsening symptoms he should call EMS or go to the emergency department for evaluation.  Patient is agreeable and understanding.  Advised patient to follow-up with his pulmonologist as scheduled as well as PCP and rheumatologist.  Covid testing was obtained today and will contact patient if results are positive.  He does have access results through [place].  CDC guidelines, isolation protocol and ED precautions discussed if Covid positive.   Final Clinical Impressions(s) / UC Diagnoses   Final diagnoses:  Lower respiratory tract infection  Cough  Atypical chest pain  Sarcoidosis     Discharge Instructions     Your EKG did not have any changes from your previous EKG a couple months ago.  Your chest x-ray does not show any signs of pneumonia or acute changes within your lungs.  I suspect that your productive cough may be due to of viral infection, but I cannot rule out bacterial  process.  You are at increased risk for bacterial infections due to your history of sarcoidosis and tobacco use disorder.  At this time we will have you continue Coricidin HBP, increasing fluids and rest.  Also use inhaler as needed.  Try the azithromycin and complete the course.  As discussed that this is a viral infection it can take a couple weeks to get better.  If you have any fever, increased chest pain or breathing difficulty you should follow back up with our department or go to the ER.  I did review your pulmonologist progress notes and they believe that your chest pain may be due to sarcoidosis.  Make sure that you follow-up with the rheumatologist as scheduled.  If you have any acute worsening of the chest pain or any new or worsening symptoms go to ED or call the ambulance.  Speak with your primary care provider about trying Zyban for smoking cessation.  You have received COVID testing today either for positive exposure, concerning symptoms that could be related to COVID infection, screening purposes, or re-testing after confirmed positive.  Your test obtained today checks for active viral  infection in the last 1-2 weeks. If your test is negative now, you can still test positive later. So, if you do develop symptoms you should either get re-tested and/or isolate x 10 days. Please follow CDC guidelines.  While Rapid antigen tests come back in 15-20 minutes, send out PCR/molecular test results typically come back within 24 hours. In the mean time, if you are symptomatic, assume this could be a positive test and treat/monitor yourself as if you do have COVID.   We will call with test results. Please download the MyChart app and set up a profile to access test results.   If symptomatic, go home and rest. Push fluids. Take Tylenol as needed for discomfort. Gargle warm salt water. Throat lozenges. Take Mucinex DM or Robitussin for cough. Humidifier in bedroom to ease coughing. Warm showers. Also  review the COVID handout for more information.  COVID-19 INFECTION: The incubation period of COVID-19 is approximately 14 days after exposure, with most symptoms developing in roughly 4-5 days. Symptoms may range in severity from mild to critically severe. Roughly 80% of those infected will have mild symptoms. People of any age may become infected with COVID-19 and have the ability to transmit the virus. The most common symptoms include: fever, fatigue, cough, body aches, headaches, sore throat, nasal congestion, shortness of breath, nausea, vomiting, diarrhea, changes in smell and/or taste.    COURSE OF ILLNESS Some patients may begin with mild disease which can progress quickly into critical symptoms. If your symptoms are worsening please call ahead to the Emergency Department and proceed there for further treatment. Recovery time appears to be roughly 1-2 weeks for mild symptoms and 3-6 weeks for severe disease.   GO IMMEDIATELY TO ER FOR FEVER YOU ARE UNABLE TO GET DOWN WITH TYLENOL, BREATHING PROBLEMS, CHEST PAIN, FATIGUE, LETHARGY, INABILITY TO EAT OR DRINK, ETC  QUARANTINE AND ISOLATION: To help decrease the spread of COVID-19 please remain isolated if you have COVID infection or are highly suspected to have COVID infection. This means -stay home and isolate to one room in the home if you live with others. Do not share a bed or bathroom with others while ill, sanitize and wipe down all countertops and keep common areas clean and disinfected. You may discontinue isolation if you have a mild case and are asymptomatic 10 days after symptom onset as long as you have been fever free >24 hours without having to take Motrin or Tylenol. If your case is more severe (meaning you develop pneumonia or are admitted in the hospital), you may have to isolate longer.   If you have been in close contact (within 6 feet) of someone diagnosed with COVID 19, you are advised to quarantine in your home for 14 days as  symptoms can develop anywhere from 2-14 days after exposure to the virus. If you develop symptoms, you  must isolate.  Most current guidelines for COVID after exposure -isolate 10 days if you ARE NOT tested for COVID as long as symptoms do not develop -isolate 7 days if you are tested and remain asymptomatic -You do not necessarily need to be tested for COVID if you have + exposure and        develop   symptoms. Just isolate at home x10 days from symptom onset During this global pandemic, CDC advises to practice social distancing, try to stay at least 47ft away from others at all times. Wear a face covering. Wash and sanitize your hands regularly and avoid going  anywhere that is not necessary.  KEEP IN MIND THAT THE COVID TEST IS NOT 100% ACCURATE AND YOU SHOULD STILL DO EVERYTHING TO PREVENT POTENTIAL SPREAD OF VIRUS TO OTHERS (WEAR MASK, WEAR GLOVES, [place] HANDS AND SANITIZE REGULARLY). IF INITIAL TEST IS NEGATIVE, THIS MAY NOT MEAN YOU ARE DEFINITELY NEGATIVE. MOST ACCURATE TESTING IS DONE 5-7 DAYS AFTER EXPOSURE.   It is not advised by CDC to get re-tested after receiving a positive COVID test since you can still test positive for weeks to months after you have already cleared the virus.   *If you have not been vaccinated for COVID, I strongly suggest you consider getting vaccinated as long as there are no contraindications.      ED Prescriptions    Medication Sig Dispense Auth. Provider   azithromycin (ZITHROMAX) 250 MG tablet Take 1 tablet (250 mg total) by mouth daily. Take first 2 tablets together, then 1 every day until finished. 6 tablet Gretta Cool     PDMP not reviewed this encounter.   Danton Clap, PA-C 01/03/20 [phone]

## 2020-01-03 NOTE — Discharge Instructions (Addendum)
Your EKG did not have any changes from your previous EKG a couple months ago.  Your chest x-ray does not show any signs of pneumonia or acute changes within your lungs.  I suspect that your productive cough may be due to of viral infection, but I cannot rule out bacterial process.  You are at increased risk for bacterial infections due to your history of sarcoidosis and tobacco use disorder.  At this time we will have you continue Coricidin HBP, increasing fluids and rest.  Also use inhaler as needed.  Try the azithromycin and complete the course.  As discussed that this is a viral infection it can take a couple weeks to get better.  If you have any fever, increased chest pain or breathing difficulty you should follow back up with our department or go to the ER.  I did review your pulmonologist progress notes and they believe that your chest pain may be due to sarcoidosis.  Make sure that you follow-up with the rheumatologist as scheduled.  If you have any acute worsening of the chest pain or any new or worsening symptoms go to ED or call the ambulance.  Speak with your primary care provider about trying Zyban for smoking cessation.  You have received COVID testing today either for positive exposure, concerning symptoms that could be related to COVID infection, screening purposes, or re-testing after confirmed positive.  Your test obtained today checks for active viral infection in the last 1-2 weeks. If your test is negative now, you can still test positive later. So, if you do develop symptoms you should either get re-tested and/or isolate x 10 days. Please follow CDC guidelines.  While Rapid antigen tests come back in 15-20 minutes, send out PCR/molecular test results typically come back within 24 hours. In the mean time, if you are symptomatic, assume this could be a positive test and treat/monitor yourself as if you do have COVID.   We will call with test results. Please download the MyChart app and  set up a profile to access test results.   If symptomatic, go home and rest. Push fluids. Take Tylenol as needed for discomfort. Gargle warm salt water. Throat lozenges. Take Mucinex DM or Robitussin for cough. Humidifier in bedroom to ease coughing. Warm showers. Also review the COVID handout for more information.  COVID-19 INFECTION: The incubation period of COVID-19 is approximately 14 days after exposure, with most symptoms developing in roughly 4-5 days. Symptoms may range in severity from mild to critically severe. Roughly 80% of those infected will have mild symptoms. People of any age may become infected with COVID-19 and have the ability to transmit the virus. The most common symptoms include: fever, fatigue, cough, body aches, headaches, sore throat, nasal congestion, shortness of breath, nausea, vomiting, diarrhea, changes in smell and/or taste.    COURSE OF ILLNESS Some patients may begin with mild disease which can progress quickly into critical symptoms. If your symptoms are worsening please call ahead to the Emergency Department and proceed there for further treatment. Recovery time appears to be roughly 1-2 weeks for mild symptoms and 3-6 weeks for severe disease.   GO IMMEDIATELY TO ER FOR FEVER YOU ARE UNABLE TO GET DOWN WITH TYLENOL, BREATHING PROBLEMS, CHEST PAIN, FATIGUE, LETHARGY, INABILITY TO EAT OR DRINK, ETC  QUARANTINE AND ISOLATION: To help decrease the spread of COVID-19 please remain isolated if you have COVID infection or are highly suspected to have COVID infection. This means -stay home and isolate to  one room in the home if you live with others. Do not share a bed or bathroom with others while ill, sanitize and wipe down all countertops and keep common areas clean and disinfected. You may discontinue isolation if you have a mild case and are asymptomatic 10 days after symptom onset as long as you have been fever free >24 hours without having to take Motrin or Tylenol. If  your case is more severe (meaning you develop pneumonia or are admitted in the hospital), you may have to isolate longer.   If you have been in close contact (within 6 feet) of someone diagnosed with COVID 19, you are advised to quarantine in your home for 14 days as symptoms can develop anywhere from 2-14 days after exposure to the virus. If you develop symptoms, you  must isolate.  Most current guidelines for COVID after exposure -isolate 10 days if you ARE NOT tested for COVID as long as symptoms do not develop -isolate 7 days if you are tested and remain asymptomatic -You do not necessarily need to be tested for COVID if you have + exposure and        develop   symptoms. Just isolate at home x10 days from symptom onset During this global pandemic, CDC advises to practice social distancing, try to stay at least 27ft away from others at all times. Wear a face covering. Wash and sanitize your hands regularly and avoid going anywhere that is not necessary.  KEEP IN MIND THAT THE COVID TEST IS NOT 100% ACCURATE AND YOU SHOULD STILL DO EVERYTHING TO PREVENT POTENTIAL SPREAD OF VIRUS TO OTHERS (WEAR MASK, WEAR GLOVES, [place] HANDS AND SANITIZE REGULARLY). IF INITIAL TEST IS NEGATIVE, THIS MAY NOT MEAN YOU ARE DEFINITELY NEGATIVE. MOST ACCURATE TESTING IS DONE 5-7 DAYS AFTER EXPOSURE.   It is not advised by CDC to get re-tested after receiving a positive COVID test since you can still test positive for weeks to months after you have already cleared the virus.   *If you have not been vaccinated for COVID, I strongly suggest you consider getting vaccinated as long as there are no contraindications.

## 2020-01-04 LAB — SARS CORONAVIRUS 2 (TAT 6-24 HRS): SARS Coronavirus 2: NEGATIVE

## 2020-01-06 DIAGNOSIS — G4733 Obstructive sleep apnea (adult) (pediatric): Secondary | ICD-10-CM | POA: Diagnosis not present

## 2020-02-05 DIAGNOSIS — G4733 Obstructive sleep apnea (adult) (pediatric): Secondary | ICD-10-CM | POA: Diagnosis not present

## 2020-02-10 ENCOUNTER — Ambulatory Visit: Payer: Medicaid Other | Admitting: Internal Medicine

## 2020-02-10 NOTE — Progress Notes (Deleted)
Follow-up Outpatient Visit Date: 02/10/2020  Primary Care Provider: Kizzie Bane [place] [address]  Chief Complaint: ***  HPI:  Luis Mcknight is a 39 y.o. male with history of sarcoidosis, pericarditis, hypertension, arthritis, and GERD, who presents for follow-up of pericarditis.  He was last evaluated through a virtual visit in late August by Laurann Montana, NP, at which time he reported feeling well following weaning from colchicine and addition of pantoprazole.  Chronic left-sided pain that he attributes to sarcoidosis was stable.  He was evaluated by Dr. Patsey Berthold (pulmonary) in October and was felt to have a good response to prednisone therapy.  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  --------------------------------------------------------------------------------------------------  Cardiovascular History & Procedures: Cardiovascular Problems:  Chest pain and sarcoidosis  Pericarditis  Risk Factors:  Hypertension, male gender, and obesity  Cath/PCI:  None  CV Surgery:  None  EP Procedures and Devices:  None  Non-Invasive Evaluation(s):  Cardiac MRI (04/29/2019): Normal LV size and function (LVEF 70%).  Normal RV size and function.  Trivial mitral and aortic regurgitation.  No pericardial effusion.  No abnormal delayed hyperenhancement to suggest scar, infiltration, sarcoidosis.  TTE (12/11/2018): Normal LV size and wall thickness. LVEF 60-65% with normal diastolic function. Normal RV size and function. Trivial mitral, pulmonic, and tricuspid regurgitation. Moderately dilated pulmonary artery. Borderline dilation of the aortic root (3.8 cm).  Recent CV Pertinent Labs: Lab Results  Component Value Date   BNP 4.0 12/21/2017   K 4.4 04/03/2019   BUN 16 04/03/2019   CREATININE 0.98 04/03/2019    Past medical and surgical history were reviewed and updated in EPIC.  No outpatient  medications have been marked as taking for the 02/10/20 encounter (Appointment) with Luis Mcknight, Harrell Gave, MD.    Allergies: Latex  Social History   Tobacco Use  . Smoking status: Current Some Day Smoker    Packs/day: 1.00    Years: 21.00    Pack years: 21.00    Types: Cigarettes    Start date: 03/07/1998  . Smokeless tobacco: Never Used  . Tobacco comment: down to 6 cigarettes a day   Vaping Use  . Vaping Use: Former  . Start date: 04/18/2012  . Quit date: 04/18/2016  Substance Use Topics  . Alcohol use: Not Currently  . Drug use: No    Family History  Problem Relation Age of Onset  . Hypertension Mother   . Diabetes Mother   . Diabetes Father   . Hypertension Father   . Heart attack Father 87    Review of Systems: A 12-system review of systems was performed and was negative except as noted in the HPI.  --------------------------------------------------------------------------------------------------  Physical Exam: There were no vitals taken for this visit.  General:  *** HEENT: No conjunctival pallor or scleral icterus. Facemask in place. Neck: Supple without lymphadenopathy, thyromegaly, JVD, or HJR. Lungs: Normal work of breathing. Clear to auscultation bilaterally without wheezes or crackles. Heart: Regular rate and rhythm without murmurs, rubs, or gallops. Non-displaced PMI. Abd: Bowel sounds present. Soft, NT/ND without hepatosplenomegaly Ext: No lower extremity edema. Radial, PT, and DP pulses are 2+ bilaterally. Skin: Warm and dry without rash.  EKG:  ***  Lab Results  Component Value Date   WBC 7.1 01/01/2019   HGB 14.7 01/01/2019   HCT 45.7 01/01/2019   MCV 80.3 01/01/2019   PLT 237 01/01/2019    Lab Results  Component Value Date   NA 141 04/03/2019  K 4.4 04/03/2019   CL 103 04/03/2019   CO2 23 04/03/2019   BUN 16 04/03/2019   CREATININE 0.98 04/03/2019   GLUCOSE 95 04/03/2019   ALT 52 (H) 01/01/2019    No results found for: CHOL, HDL,  LDLCALC, LDLDIRECT, TRIG, CHOLHDL  --------------------------------------------------------------------------------------------------  ASSESSMENT AND PLAN: ***  Nelva Bush, MD 02/10/2020 6:49 AM

## 2020-02-17 NOTE — Progress Notes (Deleted)
Office Visit    Patient Name: Luis Mcknight Date of Encounter: 02/17/2020  Primary Care Provider:  Ferrel Logan, PA-C Primary Cardiologist:  Nelva Bush, MD Electrophysiologist:  None   Chief Complaint    Luis Mcknight is a 39 y.o. male with a hx of sarcoidosis, pericarditis, HTN, arthritis, GERD presents today for ***   Past Medical History    Past Medical History:  Diagnosis Date  . Arthritis   . Depression    NO MEDS  . GERD (gastroesophageal reflux disease)    NO MEDS  . Hypertension   . Pneumonia    JUNE 2020   Past Surgical History:  Procedure Laterality Date  . ELECTROMAGNETIC NAVIGATION BROCHOSCOPY Left 10/08/2018   Procedure: ELECTROMAGNETIC NAVIGATION BRONCHOSCOPY, LEFT;  Surgeon: Tyler Pita, MD;  Location: [facility];  Service: Cardiopulmonary;  Laterality: Left;  . ENDOBRONCHIAL ULTRASOUND Left 10/08/2018   Procedure: ENDOBRONCHIAL ULTRASOUND, LEFT;  Surgeon: Tyler Pita, MD;  Location: [facility];  Service: Cardiopulmonary;  Laterality: Left;  . NO PAST SURGERIES      Allergies  Allergies  Allergen Reactions  . Latex Itching    GLOVES    History of Present Illness    Luis Mcknight is a 39 y.o. male with a hx of sarcoidosis, pericarditis, HTN, arthritis, GERD last seen 11/04/19 via telemedicine.    He was seen in clinic 05/2019 doing overall well with improvement in chest pain and occasional transient chest discomfort. 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 Seen 09/04/19 with worsening left sided chest pain. He had run out of his prednisone. Chest pain was not consistent with angina or acute pericarditis with likely etiology musculoskeletal vs pleurisy. He was recommended for naproxen for one week and famotidine. Recommended continuing colchicine to complete 6 month course and then taper.   He called the office one week later reporting headache,  swelling of temples, rash. He was recommended to stop naproxen and famotidine though symptoms were atypical for reaction to these medications.     Seen in clinic 10/04/19. Noted to have discontinued his colchicine 3 weeks prior without taper and no recurrent chest discomfort. He also noted "gas pain" relieved by pepto bismol or Gas-X. He was given Rx for Pantoprazole 20mg  daily. He was also started on Losartan 25mg  daily for elevated BP.   Seen in follow up via telemedicine 11/04/19. His Losartan was increased to 50mg  daily by his PCP with improvement in BP.   ***  EKGs/Labs/Other Studies Reviewed:   The following studies were reviewed today: ***  EKG:  EKG is ordered today.  The ekg ordered today demonstrates ***  Recent Labs: 04/03/2019: BUN 16; Creatinine, Ser 0.98; Potassium 4.4; Sodium 141  Recent Lipid Panel No results found for: CHOL, TRIG, HDL, CHOLHDL, VLDL, LDLCALC, LDLDIRECT   Home Medications   No outpatient medications have been marked as taking for the 02/18/20 encounter (Appointment) with Loel Dubonnet, NP.     Review of Systems   ***   ROS All other systems reviewed and are otherwise negative except as noted above.  Physical Exam    VS:  There were no vitals taken for this visit. , BMI There is no height or weight on file to calculate BMI.  Wt Readings from Last 3 Encounters:  01/03/20 245 lb (111.1 kg)  12/18/19 247 lb 3.2 oz (112.1 kg)  11/19/19 246 lb 3.2 oz (111.7 kg)  GEN: Well nourished, well developed, in no acute distress. HEENT: normal. Neck: Supple, no JVD, carotid bruits, or masses. Cardiac: ***RRR, no murmurs, rubs, or gallops. No clubbing, cyanosis, edema.  ***Radials/DP/PT 2+ and equal bilaterally.  Respiratory:  ***Respirations regular and unlabored, clear to auscultation bilaterally. GI: Soft, nontender, nondistended. MS: No deformity or atrophy. Skin: Warm and dry, no rash. Neuro:  Strength and sensation are intact. Psych: Normal  affect.  Assessment & Plan    1. Hx of pericarditis -   2. GERD -   3. HTN -   4. OSA -   5. Tobacco use -   Disposition: Follow up {follow up:15908} with Dr. Saunders Revel or APP.   Signed, Loel Dubonnet, NP 02/17/2020, 8:26 PM [place]

## 2020-02-18 ENCOUNTER — Ambulatory Visit: Payer: Medicaid Other | Admitting: Family

## 2020-02-19 ENCOUNTER — Other Ambulatory Visit: Payer: Self-pay | Admitting: Pulmonary Disease

## 2020-02-24 NOTE — Progress Notes (Deleted)
Office Visit    Patient Name: Luis Mcknight Date of Encounter: 02/24/2020  Primary Care Provider:  Ferrel Logan, PA-C Primary Cardiologist:  Nelva Bush, MD Electrophysiologist:  None   Chief Complaint    Luis Mcknight is a 39 y.o. male with a hx of sarcoidosis, pericarditis, HTN, arthritis, GERD presents today for ***   Past Medical History    Past Medical History:  Diagnosis Date  . Arthritis   . Depression    NO MEDS  . GERD (gastroesophageal reflux disease)    NO MEDS  . Hypertension   . Pneumonia    JUNE 2020   Past Surgical History:  Procedure Laterality Date  . ELECTROMAGNETIC NAVIGATION BROCHOSCOPY Left 10/08/2018   Procedure: ELECTROMAGNETIC NAVIGATION BRONCHOSCOPY, LEFT;  Surgeon: Tyler Pita, MD;  Location: [facility];  Service: Cardiopulmonary;  Laterality: Left;  . ENDOBRONCHIAL ULTRASOUND Left 10/08/2018   Procedure: ENDOBRONCHIAL ULTRASOUND, LEFT;  Surgeon: Tyler Pita, MD;  Location: [facility] OR[facility];  Service: Cardiopulmonary;  Laterality: Left;  . NO PAST SURGERIES      Allergies  Allergies  Allergen Reactions  . Latex Itching    GLOVES    History of Present Illness    Luis Mcknight is a 39 y.o. male with a hx of sarcoidosis, pericarditis, HTN, arthritis, GERD last seen 11/04/19 via telemedicine.    He was seen in clinic 05/2019 doing overall well with improvement in chest pain and occasional transient chest discomfort. 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 Seen 09/04/19 with worsening left sided chest pain. He had run out of his prednisone. Chest pain was not consistent with angina or acute pericarditis with likely etiology musculoskeletal vs pleurisy. He was recommended for naproxen for one week and famotidine. Recommended continuing colchicine to complete 6 month course and then taper.   He called the office one week later reporting headache,  swelling of temples, rash. He was recommended to stop naproxen and famotidine though symptoms were atypical for reaction to these medications.     Seen in clinic 10/04/19. Noted to have discontinued his colchicine 3 weeks prior without taper and no recurrent chest discomfort. He also noted "gas pain" relieved by pepto bismol or Gas-X. He was given Rx for Pantoprazole 20mg  daily. He was also started on Losartan 25mg  daily for elevated BP.   Seen in follow up via telemedicine 11/04/19. His Losartan was increased to 50mg  daily by his PCP with improvement in BP.   ***  EKGs/Labs/Other Studies Reviewed:   The following studies were reviewed today: ***  EKG:  EKG is ordered today.  The ekg ordered today demonstrates ***  Recent Labs: 04/03/2019: BUN 16; Creatinine, Ser 0.98; Potassium 4.4; Sodium 141  Recent Lipid Panel No results found for: CHOL, TRIG, HDL, CHOLHDL, VLDL, LDLCALC, LDLDIRECT   Home Medications   No outpatient medications have been marked as taking for the 02/25/20 encounter (Appointment) with Loel Dubonnet, NP.     Review of Systems   ***   ROS All other systems reviewed and are otherwise negative except as noted above.  Physical Exam    VS:  There were no vitals taken for this visit. , BMI There is no height or weight on file to calculate BMI.  Wt Readings from Last 3 Encounters:  01/03/20 245 lb (111.1 kg)  12/18/19 247 lb 3.2 oz (112.1 kg)  11/19/19 246 lb 3.2 oz (111.7 kg)  GEN: Well nourished, well developed, in no acute distress. HEENT: normal. Neck: Supple, no JVD, carotid bruits, or masses. Cardiac: ***RRR, no murmurs, rubs, or gallops. No clubbing, cyanosis, edema.  ***Radials/DP/PT 2+ and equal bilaterally.  Respiratory:  ***Respirations regular and unlabored, clear to auscultation bilaterally. GI: Soft, nontender, nondistended. MS: No deformity or atrophy. Skin: Warm and dry, no rash. Neuro:  Strength and sensation are intact. Psych: Normal  affect.  Assessment & Plan    1. Hx of pericarditis -   2. GERD -   3. HTN -   4. OSA -   5. Tobacco use -   Disposition: Follow up {follow up:15908} with Dr. Saunders Revel or APP.   Signed, Loel Dubonnet, NP 02/24/2020, 9:31 PM [facility]

## 2020-02-25 ENCOUNTER — Ambulatory Visit: Payer: Medicaid Other | Admitting: Family

## 2020-02-27 ENCOUNTER — Encounter: Payer: Self-pay | Admitting: Family

## 2020-03-07 DIAGNOSIS — G4733 Obstructive sleep apnea (adult) (pediatric): Secondary | ICD-10-CM | POA: Diagnosis not present

## 2020-03-12 ENCOUNTER — Telehealth: Payer: Self-pay | Admitting: Family Medicine

## 2020-03-12 NOTE — Telephone Encounter (Signed)
Reason for CRM: Pt called to cancel new patient appt with Dr. Yetta Barre. Pt stated he tested positive for Covid and he has some questions. Pt requests that a nurse return his call. Cb# [phone]  Patient did + COVID home test- had NP appointment- that has been canceled- he does want to reschedule  Pt reports symptoms of sinus symptoms, headaches, SOB- which has improves and he reports is better Criteria for self-isolation:  -Please quarantine and isolate at home  for at least 10 days since symptoms started AND - At least 24 hours fever free without the use of fever reducing medications such as Tylenol or Ibuprofen AND - Improvement in respiratory symptoms Use over-the-counter medications for symptoms.If you develop respiratory issues/distress, seek medical care in the Emergency Department.  If you must leave home or if you have to be around others please wear a mask. Please limit contact with immediate family members in the home, practice social distancing, frequent handwashing and clean hard surfaces touched frequently with household cleaning products. Members of your household will also need to quarantine and test. Pt informed that the health department will likely follow up and may have additional recommendations.

## 2020-03-12 NOTE — Telephone Encounter (Unsigned)
Copied from CRM [id number]. Topic: General - Other >> Mar 12, 2020  9:58 AM Marylen Ponto wrote: Reason for CRM: Pt called to cancel new patient appt with Dr. Yetta Barre. Pt stated he tested positive for Covid and he has some questions. Pt requests that a nurse return his call. Cb# [phone]

## 2020-03-13 ENCOUNTER — Ambulatory Visit: Payer: Medicaid Other | Admitting: Family Medicine

## 2020-03-18 DIAGNOSIS — G4733 Obstructive sleep apnea (adult) (pediatric): Secondary | ICD-10-CM | POA: Diagnosis not present

## 2020-04-18 ENCOUNTER — Other Ambulatory Visit: Payer: Self-pay | Admitting: Family

## 2020-04-18 DIAGNOSIS — G4733 Obstructive sleep apnea (adult) (pediatric): Secondary | ICD-10-CM | POA: Diagnosis not present

## 2020-04-20 NOTE — Telephone Encounter (Signed)
Please schedule overdue F/U appointment. Patient did not show for last scheduled visit. Thank you!

## 2020-04-20 NOTE — Telephone Encounter (Signed)
Scheduled

## 2020-05-05 ENCOUNTER — Other Ambulatory Visit: Payer: Self-pay | Admitting: Family

## 2020-05-05 DIAGNOSIS — I10 Essential (primary) hypertension: Secondary | ICD-10-CM

## 2020-05-06 ENCOUNTER — Telehealth (INDEPENDENT_AMBULATORY_CARE_PROVIDER_SITE_OTHER): Payer: Medicaid Other | Admitting: Family

## 2020-05-06 ENCOUNTER — Telehealth: Payer: Self-pay | Admitting: Family

## 2020-05-06 ENCOUNTER — Encounter: Payer: Self-pay | Admitting: Family

## 2020-05-06 VITALS — BP 140/90 | HR 90 | Ht 73.0 in | Wt 250.0 lb

## 2020-05-06 DIAGNOSIS — D869 Sarcoidosis, unspecified: Secondary | ICD-10-CM | POA: Diagnosis not present

## 2020-05-06 DIAGNOSIS — Z8679 Personal history of other diseases of the circulatory system: Secondary | ICD-10-CM | POA: Diagnosis not present

## 2020-05-06 DIAGNOSIS — K219 Gastro-esophageal reflux disease without esophagitis: Secondary | ICD-10-CM

## 2020-05-06 DIAGNOSIS — G4733 Obstructive sleep apnea (adult) (pediatric): Secondary | ICD-10-CM

## 2020-05-06 DIAGNOSIS — I10 Essential (primary) hypertension: Secondary | ICD-10-CM

## 2020-05-06 MED ORDER — PANTOPRAZOLE SODIUM 20 MG PO TBEC: 20.0000 mg | DELAYED_RELEASE_TABLET | Freq: Every day | ORAL | 1 refills | Status: DC

## 2020-05-06 MED ORDER — ISOSORBIDE MONONITRATE ER 60 MG PO TB24: 60.0000 mg | ORAL_TABLET | Freq: Every day | ORAL | 2 refills | Status: DC

## 2020-05-06 NOTE — Progress Notes (Signed)
Virtual Visit via Telephone Note   This visit type was conducted due to national recommendations for restrictions regarding the COVID-19 Pandemic (e.g. social distancing) in an effort to limit this patient's exposure and mitigate transmission in our community.  Due to his co-morbid illnesses, this patient is at least at moderate risk for complications without adequate follow up.  This format is felt to be most appropriate for this patient at this time.  The patient did not have access to video technology/had technical difficulties with video requiring transitioning to audio format only (telephone).  All issues noted in this document were discussed and addressed.  No physical exam could be performed with this format.  Please refer to the patient's chart for his  consent to telehealth for [facility].    Date:  05/06/2020   ID:  Luis Mcknight, DOB 11/18/1980, MRN [id number] The patient was identified using 2 identifiers.  Patient Location: Home Provider Location: Office/Clinic   PCP:  Davis, Diane Marie, [place]  Cardiologist:  Nelva Bush, MD  Advanced Practice Provider:  No care team member to display Electrophysiologist:  None   Evaluation Performed:  Follow-Up Visit  Chief Complaint:  Follow up HTN  History of Present Illness:     Luis Mcknight is a 40 y.o. male with a hx of sarcoidisis, pericarditis, HTN, arthritis, GERD. He was last seen 11/04/19.  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 Seen 09/04/19 with worsening left sided chest pain in setting of running out of prednisone. Chest pain was not consistent with angina or acute pericarditis with likely etiology musculoskeletal vs pleurisy. He was recommended for naproxen for one week and famotidine. Recommended continuing colchicine to complete 6 month course and then taper.    He called the office one week later reporting headache, swelling of temples, rash. He was recommended to stop naproxen and famotidine though symptoms were atypical for reaction to these medications.     Seen in clinic 10/04/19. Noted to have discontinued his colchicine 3 weeks prior without taper and no recurrent chest discomfort. He also noted "gas pain" relieved by pepto bismol or Gas-X. He was given Rx for Pantoprazole 20mg  daily. He was also started on Losartan 25mg  daily for elevated BP.   At follow up via telemedicine 11/04/19 indigestion had improved and BP was well controlled on Losartan 50mg  QD as the dose had been escalated by PCP. No changes were made at that time.  He was seen by pulmonology 12/18/19 and referred to rheumatology at [facility] due to sarcoidosis. Not yet seen.   Presents today for follow up. He has been exercising on his bike indoors. Endorses eating both at home and in the restaurant and we discussed the likely restaurants. Tells me he feel mostly recuperated from having COVID last month. Still reports mild productive cough. We talked about using Muccinex or Coricidin. Tells me his blood pressure has been high with readings 135/89 - 140s/100s. It has been high for about 3 weeks per his report. Tells me he previously tried higher dose of Losartan but felt it did not make much of a difference. He wishes to come off Losartan.   Reports aching in his left arm. Tells me Tylenol does not help much. Stress ball does help. No chest pain, pressure, tightness. Denies injury. We discussed that this was not consistent with cardiac etiology and felt  different than his previous pericarditis.   Past Medical History:  Diagnosis Date  . Arthritis   . Depression    NO MEDS  . GERD (gastroesophageal reflux disease)    NO MEDS  . Hypertension   . Pneumonia    JUNE 2020   Past Surgical History:  Procedure Laterality Date  . ELECTROMAGNETIC NAVIGATION BROCHOSCOPY Left 10/08/2018   Procedure:  ELECTROMAGNETIC NAVIGATION BRONCHOSCOPY, LEFT;  Surgeon: Tyler Pita, MD;  Location: [facility] ORS;  Service: Cardiopulmonary;  Laterality: Left;  . ENDOBRONCHIAL ULTRASOUND Left 10/08/2018   Procedure: ENDOBRONCHIAL ULTRASOUND, LEFT;  Surgeon: Tyler Pita, MD;  Location: [facility] ORS;  Service: Cardiopulmonary;  Laterality: Left;  . NO PAST SURGERIES       Current Meds  Medication Sig  . acetaminophen (TYLENOL) 500 MG tablet Take 500-1,000 mg by mouth every 6 (six) hours as needed.  Marland Kitchen amLODipine (NORVASC) 10 MG tablet Take 10 mg by mouth every morning.   . isosorbide mononitrate (IMDUR) 60 MG 24 hr tablet Take 1 tablet (60 mg total) by mouth daily.  . predniSONE (DELTASONE) 10 MG tablet TAKE 1 TABLET(10 MG) BY MOUTH DAILY WITH BREAKFAST  . [DISCONTINUED] losartan (COZAAR) 50 MG tablet Take 1 tablet (50 mg total) by mouth daily. Please keep appointment to get further refills. Thank you!  . [DISCONTINUED] pantoprazole (PROTONIX) 20 MG tablet TAKE 1 TABLET(20 MG) BY MOUTH DAILY     Allergies:   Latex   Social History   Tobacco Use  . Smoking status: Current Some Day Smoker    Packs/day: 1.00    Years: 21.00    Pack years: 21.00    Types: Cigarettes    Start date: 03/07/1998  . Smokeless tobacco: Never Used  . Tobacco comment: down to 6 cigarettes a day   Vaping Use  . Vaping Use: Former  . Start date: 04/18/2012  . Quit date: 04/18/2016  Substance Use Topics  . Alcohol use: Not Currently  . Drug use: No     Family Hx: The patient's family history includes Diabetes in his father and mother; Heart attack (age of onset: 30) in his father; Hypertension in his father and mother.  ROS:   Please see the history of present illness.     All other systems reviewed and are negative.   Prior CV studies:   The following studies were reviewed today:  None  Labs/Other Tests and Data Reviewed:    EKG:  No ECG reviewed.  Recent Labs: No results found for requested labs within  last 8760 hours.   Recent Lipid Panel No results found for: CHOL, TRIG, HDL, CHOLHDL, LDLCALC, LDLDIRECT  Wt Readings from Last 3 Encounters:  05/06/20 250 lb (113.4 kg)  01/03/20 245 lb (111.1 kg)  12/18/19 247 lb 3.2 oz (112.1 kg)     Objective:    Vital Signs:  BP 140/90   Pulse 90   Ht 6\' 1"  (1.854 m)   Wt 250 lb (113.4 kg)   BMI 32.98 kg/m    VITAL SIGNS:  reviewed  ASSESSMENT & PLAN:    1. Hx of pericarditis - No recurrence. No indication for colchicine at this time. On Prednisone 10mg  daily per pulmonology due to sarcoidosis.   2. GERD - Reports this is well controlled on the Protonix. Refill provided.   3. Sarcoidosis - Continue to follow with pulmonology. Referred to rheumatology by pulmonology.   4. HTN - BP elevated. Wishes to come off Losartan as he does not  feel it is effective. Continue Amlodipine 10mg  QD. Stop Losartan 50mg  QD. Hesitant to utilize alternate ARB/HCTZ as he often changes to phone appointment and serial lab work would be difficult. Start Imdur 60mg  daily. Will send MyChart message in 1 week to check in. If tolerates and BP not at goal, consider increased dose. If he does not tolerate, consider trial of Telmisartan with BMP prior.   5. OSA - CPAP compliance encouraged.   6. Tobacco use - Present some day smoker. Smoking cessation encouraged. Recommend utilization of 1800QUITNOW.  7. Left arm pain - Starts in shoulder and goes down to hand. Describes as "aching". No relief with Tylenol though is relieved by stress ball. Likely etiology musculoskeletal vs nerve. Encouraged to follow up with PCP. Recommend heat, PRN ibuprofen, stretching. Low suspicion cardiac etiology as atypical with no chest pain, pressure, tightness and has been going on for some time.   Time:   Today, I have spent 12 minutes with the patient with telehealth technology discussing the above problems.     Medication Adjustments/Labs and Tests Ordered: Current medicines are  reviewed at length with the patient today.  Concerns regarding medicines are outlined above.   Tests Ordered: No orders of the defined types were placed in this encounter.  Medication Changes: Meds ordered this encounter  Medications  . isosorbide mononitrate (IMDUR) 60 MG 24 hr tablet    Sig: Take 1 tablet (60 mg total) by mouth daily.    Dispense:  30 tablet    Refill:  2    Order Specific Question:   Supervising Provider    Answer:   Richardo Priest [id number]  . pantoprazole (PROTONIX) 20 MG tablet    Sig: Take 1 tablet (20 mg total) by mouth daily.    Dispense:  90 tablet    Refill:  1    Order Specific Question:   Supervising Provider    Answer:   Richardo Priest [[id number]]    Follow Up:  In Person in 3 month(s)  Signed, Loel Dubonnet, NP  05/06/2020 9:29 AM    [place]

## 2020-05-06 NOTE — Patient Instructions (Addendum)
Medication Instructions:  Your physician has recommended you make the following change in your medication:   STOP Losartan  START Isosorbide Mononitrate (Imdur) 60mg  once per day  Isosorbide Mononitrate (Imdur) is a daily medication to help with your blood pressure. The most common side effect is a mild headache which typically resolves after a few days. If you have a persistent headache, please call our office and we will consider changing the medication.   You may use Guafenesin (Muccinex) as needed for chest congestion.   *If you need a refill on your cardiac medications before your next appointment, please call your pharmacy*   Lab Work: None ordered today.   We need to have your next office visit in-person to collect routine lab work unless it has been drawn by your primary care provider recently.   Testing/Procedures: None ordered today.   Follow-Up: At [facility], you and your health needs are our priority.  As part of our continuing mission to provide you with exceptional heart care, we have created designated Provider Care Teams.  These Care Teams include your primary Cardiologist (physician) and Advanced Practice Providers (APPs -  Physician Assistants and Nurse Practitioners) who all work together to provide you with the care you need, when you need it.  We recommend signing up for the patient portal called "MyChart".  Sign up information is provided on this After Visit Summary.  MyChart is used to connect with patients for Virtual Visits (Telemedicine).  Patients are able to view lab/test results, encounter notes, upcoming appointments, etc.  Non-urgent messages can be sent to your provider as well.   To learn more about what you can do with MyChart, go to [email or web address].    Your next appointment:   3 month(s)  The format for your next appointment:   In Person  Provider:   You may see Nelva Bush, MD or one of the following Advanced Practice Providers  on your designated Care Team:    Murray Hodgkins, NP  Christell Faith, PA-C  Marrianne Mood, PA-C  Cadence Kathlen Mody, [place]  Laurann Montana, NP  Other Instructions  Heart Healthy Diet Recommendations: A low-salt diet is recommended. Meats should be grilled, baked, or boiled. Avoid fried foods. Focus on lean protein sources like fish or chicken with vegetables and fruits. The American Heart Association is a Microbiologist!  American Heart Association Diet and Lifeystyle Recommendations   Exercise recommendations: The American Heart Association recommends 150 minutes of moderate intensity exercise weekly. Try 30 minutes of moderate intensity exercise 4-5 times per week. This could include walking, jogging, or swimming.  Your left arm pain sounds like a muscle ache or pinched nerve. Recommend following up with your primary care provider. If Tylenol is not effective, you could try Ibuprofen as needed. Also recommend trying gentle stretching exercises below and a heat pack.  Stretching Exercises  Rotation neck stretching 1. Sit in a chair or stand up. 2. Place your feet flat on the floor, shoulder width apart. 3. Slowly turn your head (rotate) to the right until a slight stretch is felt. Turn it all the way to the right so you can look over your right shoulder. Do not tilt or tip your head. 4. Hold this position for 10-30 seconds. 5. Slowly turn your head (rotate) to the left until a slight stretch is felt. Turn it all the way to the left so you can look over your left shoulder. Do not tilt or tip your head. 6. Hold this position  for 10-30 seconds.    Scapular retraction 1. Stand with your arms at your sides. Look straight ahead. 2. Slowly pull both shoulders (scapulae) backward and downward (retraction) until you feel a stretch between your shoulder blades in your upper back. 3. Hold for 10-30 seconds. 4. Relax and repeat.

## 2020-05-06 NOTE — Telephone Encounter (Signed)
  Patient Consent for Virtual Visit         Luis Mcknight has provided verbal consent on 05/06/2020 for a virtual visit (video or telephone).   CONSENT FOR VIRTUAL VISIT FOR:  Luis Mcknight  By participating in this virtual visit I agree to the following:  I hereby voluntarily request, consent and authorize [facility] and its employed or contracted physicians, physician assistants, nurse practitioners or other licensed health care professionals (the Practitioner), to provide me with telemedicine health care services (the "Services") as deemed necessary by the treating Practitioner. I acknowledge and consent to receive the Services by the Practitioner via telemedicine. I understand that the telemedicine visit will involve communicating with the Practitioner through live audiovisual communication technology and the disclosure of certain medical information by electronic transmission. I acknowledge that I have been given the opportunity to request an in-person assessment or other available alternative prior to the telemedicine visit and am voluntarily participating in the telemedicine visit.  I understand that I have the right to withhold or withdraw my consent to the use of telemedicine in the course of my care at any time, without affecting my right to future care or treatment, and that the Practitioner or I may terminate the telemedicine visit at any time. I understand that I have the right to inspect all information obtained and/or recorded in the course of the telemedicine visit and may receive copies of available information for a reasonable fee.  I understand that some of the potential risks of receiving the Services via telemedicine include:  Marland Kitchen Delay or interruption in medical evaluation due to technological equipment failure or disruption; . Information transmitted may not be sufficient (e.g. poor resolution of images) to allow for appropriate medical decision making by the Practitioner;  and/or  . In rare instances, security protocols could fail, causing a breach of personal health information.  Furthermore, I acknowledge that it is my responsibility to provide information about my medical history, conditions and care that is complete and accurate to the best of my ability. I acknowledge that Practitioner's advice, recommendations, and/or decision may be based on factors not within their control, such as incomplete or inaccurate data provided by me or distortions of diagnostic images or specimens that may result from electronic transmissions. I understand that the practice of medicine is not an exact science and that Practitioner makes no warranties or guarantees regarding treatment outcomes. I acknowledge that a copy of this consent can be made available to me via my patient portal ([facility]), or I can request a printed copy by calling the office of [facility].    I understand that my insurance will be billed for this visit.   I have read or had this consent read to me. . I understand the contents of this consent, which adequately explains the benefits and risks of the Services being provided via telemedicine.  . I have been provided ample opportunity to ask questions regarding this consent and the Services and have had my questions answered to my satisfaction. . I give my informed consent for the services to be provided through the use of telemedicine in my medical care

## 2020-05-12 DIAGNOSIS — Z79899 Other long term (current) drug therapy: Secondary | ICD-10-CM | POA: Diagnosis not present

## 2020-05-12 DIAGNOSIS — M722 Plantar fascial fibromatosis: Secondary | ICD-10-CM | POA: Diagnosis not present

## 2020-05-12 DIAGNOSIS — M50322 Other cervical disc degeneration at C5-C6 level: Secondary | ICD-10-CM | POA: Diagnosis not present

## 2020-05-12 DIAGNOSIS — M25519 Pain in unspecified shoulder: Secondary | ICD-10-CM | POA: Diagnosis not present

## 2020-05-12 DIAGNOSIS — G478 Other sleep disorders: Secondary | ICD-10-CM | POA: Diagnosis not present

## 2020-05-12 DIAGNOSIS — M791 Myalgia, unspecified site: Secondary | ICD-10-CM | POA: Diagnosis not present

## 2020-05-12 DIAGNOSIS — M7912 Myalgia of auxiliary muscles, head and neck: Secondary | ICD-10-CM | POA: Diagnosis not present

## 2020-05-12 DIAGNOSIS — M19012 Primary osteoarthritis, left shoulder: Secondary | ICD-10-CM | POA: Diagnosis not present

## 2020-05-12 DIAGNOSIS — R5383 Other fatigue: Secondary | ICD-10-CM | POA: Diagnosis not present

## 2020-05-12 DIAGNOSIS — R21 Rash and other nonspecific skin eruption: Secondary | ICD-10-CM | POA: Diagnosis not present

## 2020-05-12 DIAGNOSIS — M436 Torticollis: Secondary | ICD-10-CM | POA: Diagnosis not present

## 2020-05-12 DIAGNOSIS — R079 Chest pain, unspecified: Secondary | ICD-10-CM | POA: Diagnosis not present

## 2020-05-12 DIAGNOSIS — D8686 Sarcoid arthropathy: Secondary | ICD-10-CM | POA: Diagnosis not present

## 2020-05-12 DIAGNOSIS — M79676 Pain in unspecified toe(s): Secondary | ICD-10-CM | POA: Diagnosis not present

## 2020-05-12 DIAGNOSIS — G8929 Other chronic pain: Secondary | ICD-10-CM | POA: Diagnosis not present

## 2020-05-12 DIAGNOSIS — M25512 Pain in left shoulder: Secondary | ICD-10-CM | POA: Diagnosis not present

## 2020-05-12 DIAGNOSIS — D860 Sarcoidosis of lung: Secondary | ICD-10-CM | POA: Diagnosis not present

## 2020-05-12 DIAGNOSIS — I10 Essential (primary) hypertension: Secondary | ICD-10-CM | POA: Diagnosis not present

## 2020-05-12 DIAGNOSIS — M2550 Pain in unspecified joint: Secondary | ICD-10-CM | POA: Diagnosis not present

## 2020-05-12 DIAGNOSIS — M7989 Other specified soft tissue disorders: Secondary | ICD-10-CM | POA: Diagnosis not present

## 2020-05-12 DIAGNOSIS — M19011 Primary osteoarthritis, right shoulder: Secondary | ICD-10-CM | POA: Diagnosis not present

## 2020-05-12 DIAGNOSIS — M25561 Pain in right knee: Secondary | ICD-10-CM | POA: Diagnosis not present

## 2020-05-12 DIAGNOSIS — M25562 Pain in left knee: Secondary | ICD-10-CM | POA: Diagnosis not present

## 2020-05-12 DIAGNOSIS — Z91040 Latex allergy status: Secondary | ICD-10-CM | POA: Diagnosis not present

## 2020-05-12 DIAGNOSIS — H04123 Dry eye syndrome of bilateral lacrimal glands: Secondary | ICD-10-CM | POA: Diagnosis not present

## 2020-05-13 ENCOUNTER — Encounter: Payer: Self-pay | Admitting: Family

## 2020-05-13 DIAGNOSIS — I10 Essential (primary) hypertension: Secondary | ICD-10-CM

## 2020-05-16 DIAGNOSIS — G4733 Obstructive sleep apnea (adult) (pediatric): Secondary | ICD-10-CM | POA: Diagnosis not present

## 2020-05-18 ENCOUNTER — Ambulatory Visit (INDEPENDENT_AMBULATORY_CARE_PROVIDER_SITE_OTHER): Payer: Medicaid Other | Admitting: Family Medicine

## 2020-05-18 ENCOUNTER — Other Ambulatory Visit: Payer: Self-pay

## 2020-05-18 ENCOUNTER — Encounter: Payer: Self-pay | Admitting: Family Medicine

## 2020-05-18 VITALS — BP 140/88 | HR 75 | Ht 73.0 in | Wt 225.0 lb

## 2020-05-18 DIAGNOSIS — F4323 Adjustment disorder with mixed anxiety and depressed mood: Secondary | ICD-10-CM

## 2020-05-18 DIAGNOSIS — Z7689 Persons encountering health services in other specified circumstances: Secondary | ICD-10-CM | POA: Diagnosis not present

## 2020-05-18 DIAGNOSIS — K219 Gastro-esophageal reflux disease without esophagitis: Secondary | ICD-10-CM | POA: Diagnosis not present

## 2020-05-18 DIAGNOSIS — M5030 Other cervical disc degeneration, unspecified cervical region: Secondary | ICD-10-CM | POA: Diagnosis not present

## 2020-05-18 MED ORDER — DULOXETINE HCL 20 MG PO CPEP
20.0000 mg | ORAL_CAPSULE | Freq: Two times a day (BID) | ORAL | 3 refills | Status: DC
Start: 2020-05-18 — End: 2020-06-01

## 2020-05-18 MED ORDER — PANTOPRAZOLE SODIUM 20 MG PO TBEC: 20.0000 mg | DELAYED_RELEASE_TABLET | Freq: Every day | ORAL | 1 refills | Status: DC

## 2020-05-18 MED ORDER — DULOXETINE HCL 20 MG PO CPEP: 20.0000 mg | ORAL_CAPSULE | Freq: Two times a day (BID) | ORAL | 3 refills | Status: DC

## 2020-05-18 NOTE — Progress Notes (Signed)
[place]   Date:  05/18/2020   Name:  Luis Mcknight   DOB:  Mar 04, 1981   MRN:  [id number]   Chief Complaint: Establish Care (New patient. ), Depression (PHQ9 -18 and GAD7- 15), and Neck Pain (Neck pain that radiates into head. Started 2 years ago and feels like getting worse. Had Xrays done recently on neck. Was wanting PT recommendations for neck pain. )  Depression        This is a new problem.  The current episode started more than 1 month ago.   The onset quality is gradual.   The problem occurs intermittently.  The problem has been waxing and waning since onset.  Associated symptoms include decreased concentration, insomnia, irritable, restlessness, decreased interest and sad.  Associated symptoms include no fatigue, no helplessness, no hopelessness, no appetite change, no body aches, no myalgias, no headaches, no indigestion and no suicidal ideas.  Past treatments include nothing. Neck Pain  This is a chronic problem. The current episode started more than 1 year ago. The problem has been waxing and waning. The pain is associated with nothing. Pain location: trapezius. The quality of the pain is described as aching. Associated symptoms include tingling. Pertinent negatives include no chest pain, fever, headaches or weakness. Associated symptoms comments: Radicular pain left arm/.  Gastroesophageal Reflux He reports no abdominal pain, no belching, no chest pain, no choking, no coughing, no nausea, no sore throat or no wheezing. The problem has been gradually improving. The symptoms are aggravated by certain foods. Pertinent negatives include no fatigue. The treatment provided mild relief.    Lab Results  Component Value Date   CREATININE 0.98 04/03/2019   BUN 16 04/03/2019   NA 141 04/03/2019   K 4.4 04/03/2019   CL 103 04/03/2019   CO2 23 04/03/2019   No results found for: CHOL, HDL, LDLCALC, LDLDIRECT, TRIG, CHOLHDL No results found for: TSH No results found for: HGBA1C Lab Results   Component Value Date   WBC 7.1 01/01/2019   HGB 14.7 01/01/2019   HCT 45.7 01/01/2019   MCV 80.3 01/01/2019   PLT 237 01/01/2019   Lab Results  Component Value Date   ALT 52 (H) 01/01/2019   AST 23 01/01/2019   ALKPHOS 101 01/01/2019   BILITOT 0.6 01/01/2019     Review of Systems  Constitutional: Negative for appetite change, chills, fatigue and fever.  HENT: Negative for drooling, ear discharge, ear pain and sore throat.   Respiratory: Negative for cough, choking, shortness of breath and wheezing.   Cardiovascular: Negative for chest pain, palpitations and leg swelling.  Gastrointestinal: Negative for abdominal pain, blood in stool, constipation, diarrhea and nausea.  Endocrine: Negative for polydipsia.  Genitourinary: Negative for dysuria, frequency, hematuria and urgency.  Musculoskeletal: Positive for neck pain. Negative for back pain and myalgias.  Skin: Negative for rash.  Allergic/Immunologic: Negative for environmental allergies.  Neurological: Positive for tingling. Negative for dizziness, weakness and headaches.  Hematological: Does not bruise/bleed easily.  Psychiatric/Behavioral: Positive for decreased concentration and depression. Negative for suicidal ideas. The patient has insomnia. The patient is not nervous/anxious.     Patient Active Problem List   Diagnosis Date Noted  . Chest pain of uncertain etiology 81/19/1478  . OSA (obstructive sleep apnea) 06/04/2019  . Current smoker 06/04/2019  . Healthcare maintenance 06/04/2019  . Acute pericarditis 04/03/2019  . Sarcoidosis 10/16/2018  . Lung nodules   . Adenopathy   . Essential hypertension 11/28/2017    Allergies  Allergen  Reactions  . Latex Itching    GLOVES    Past Surgical History:  Procedure Laterality Date  . ELECTROMAGNETIC NAVIGATION BROCHOSCOPY Left 10/08/2018   Procedure: ELECTROMAGNETIC NAVIGATION BRONCHOSCOPY, LEFT;  Surgeon: Tyler Pita, MD;  Location: [facility] ORS;  Service:  Cardiopulmonary;  Laterality: Left;  . ENDOBRONCHIAL ULTRASOUND Left 10/08/2018   Procedure: ENDOBRONCHIAL ULTRASOUND, LEFT;  Surgeon: Tyler Pita, MD;  Location: [facility] ORS;  Service: Cardiopulmonary;  Laterality: Left;  . NO PAST SURGERIES      Social History   Tobacco Use  . Smoking status: Current Some Day Smoker    Packs/day: 0.25    Years: 20.00    Pack years: 5.00    Types: Cigarettes    Start date: 03/07/1998    Last attempt to quit: 08/06/2019    Years since quitting: 0.7  . Smokeless tobacco: Never Used  . Tobacco comment: down to 6 cigarettes a day   Vaping Use  . Vaping Use: Former  . Start date: 04/18/2012  . Quit date: 04/18/2016  Substance Use Topics  . Alcohol use: Not Currently  . Drug use: No     Medication list has been reviewed and updated.  Current Meds  Medication Sig  . acetaminophen (TYLENOL) 500 MG tablet Take 500-1,000 mg by mouth every 6 (six) hours as needed.  Marland Kitchen amLODipine (NORVASC) 10 MG tablet Take 10 mg by mouth every morning. Cardiology  . isosorbide mononitrate (IMDUR) 60 MG 24 hr tablet Take 1 tablet (60 mg total) by mouth daily. (Patient taking differently: Take 60 mg by mouth daily. Cardiology)  . pantoprazole (PROTONIX) 20 MG tablet Take 1 tablet (20 mg total) by mouth daily.  . predniSONE (DELTASONE) 10 MG tablet TAKE 1 TABLET(10 MG) BY MOUTH DAILY WITH BREAKFAST (Patient taking differently: Dr Dewayne Hatch - Pulmonology)  . UNABLE TO FIND CPAP AT NIGHT    PHQ 2/9 Scores 05/18/2020  PHQ - 2 Score 6  PHQ- 9 Score 18    GAD 7 : Generalized Anxiety Score 05/18/2020  Nervous, Anxious, on Edge 2  Control/stop worrying 2  Worry too much - different things 2  Trouble relaxing 3  Restless 3  Easily annoyed or irritable 2  Afraid - awful might happen 1  Total GAD 7 Score 15  Anxiety Difficulty Somewhat difficult    BP Readings from Last 3 Encounters:  05/18/20 140/88  05/06/20 140/90  01/03/20 (!) 143/102    Physical Exam Vitals and  nursing note reviewed.  Constitutional:      General: He is irritable.  HENT:     Head: Normocephalic.     Right Ear: Tympanic membrane, ear canal and external ear normal.     Left Ear: Tympanic membrane, ear canal and external ear normal.     Nose: Nose normal.  Eyes:     General: No scleral icterus.       Right eye: No discharge.        Left eye: No discharge.     Conjunctiva/sclera: Conjunctivae normal.     Pupils: Pupils are equal, round, and reactive to light.  Neck:     Thyroid: No thyromegaly.     Vascular: No JVD.     Trachea: No tracheal deviation.     Comments: Mild trapezius spasm Cardiovascular:     Rate and Rhythm: Normal rate and regular rhythm.     Heart sounds: Normal heart sounds. No murmur heard. No friction rub. No gallop.   Pulmonary:  Effort: No respiratory distress.     Breath sounds: Normal breath sounds. No wheezing or rales.  Abdominal:     General: Bowel sounds are normal.     Palpations: Abdomen is soft. There is no mass.     Tenderness: There is no abdominal tenderness. There is no guarding or rebound.  Musculoskeletal:        General: No tenderness. Normal range of motion.     Cervical back: Full passive range of motion without pain, normal range of motion and neck supple.  Lymphadenopathy:     Cervical: No cervical adenopathy.  Skin:    General: Skin is warm.     Findings: No rash.  Neurological:     Mental Status: He is alert and oriented to person, place, and time.     Cranial Nerves: No cranial nerve deficit.     Deep Tendon Reflexes: Reflexes are normal and symmetric.     Wt Readings from Last 3 Encounters:  05/18/20 225 lb (102.1 kg)  05/06/20 250 lb (113.4 kg)  01/03/20 245 lb (111.1 kg)    BP 140/88   Pulse 75   Ht 6\' 1"  (1.854 m)   Wt 225 lb (102.1 kg)   SpO2 97%   BMI 29.69 kg/m   Assessment and Plan:  1. Establishing care with new doctor, encounter for Patient establishing care with new physician.  Patient's  chart was reviewed for previous encounters, most recent labs, most recent x-rays, and care everywhere.  Patient has multiple concerns including sarcoidosis which involve cardiac and pulmonary seen by a Domenick Bookbinder as well as rheumatologic concerns with which he is seen in [facility]  2. Gastroesophageal reflux disease without esophagitis Chronic.  Controlled.  Stable.  We will refill his pantoprazole 20 mg once a day. - pantoprazole (PROTONIX) 20 MG tablet; Take 1 tablet (20 mg total) by mouth daily.  Dispense: 90 tablet; Refill: 1  3. Adjustment reaction with anxiety and depression Patient has a history of depression for which she has not been on any medications his current PHQ is 18 with a gad score 15.  Rather than selecting a SSRI I have gone in the direction of SNRI specifically Cymbalta given that he has had significant pain in his neck and an x-ray that is suggesting that he has significant degenerative disc disease in the cervical concern.  4. Degenerative disc disease, cervical New onset.  Persistent.  Patient has an x-ray which shows significant degenerative changes for which we will refer to sports medicine Dr. Zigmund Daniel for evaluation.  In the meantime we will initiate Cymbalta at 20 mg at 1 a day and then after 1 week we will increase to twice a day.  I will recheck on Cymbalta in 3 months but have referred to sports medicine for evaluation and titration may continue with sports medicine if needed.

## 2020-05-18 NOTE — Patient Instructions (Signed)

## 2020-05-19 ENCOUNTER — Telehealth: Payer: Self-pay | Admitting: Pulmonary Disease

## 2020-05-19 NOTE — Telephone Encounter (Signed)
I have spoken to [facility] with Dr. Epimenio Foot office and made her aware that patient had negative cardiac MRI, as instructed by Dr. Patsey Berthold.

## 2020-05-21 MED ORDER — TRIAMTERENE-HCTZ 75-50 MG PO TABS: 1.0000 | ORAL_TABLET | Freq: Every day | ORAL | 0 refills | Status: DC

## 2020-06-01 ENCOUNTER — Ambulatory Visit (INDEPENDENT_AMBULATORY_CARE_PROVIDER_SITE_OTHER): Payer: Medicaid Other | Admitting: Family Medicine

## 2020-06-01 ENCOUNTER — Encounter: Payer: Self-pay | Admitting: Family Medicine

## 2020-06-01 ENCOUNTER — Other Ambulatory Visit: Payer: Self-pay

## 2020-06-01 VITALS — BP 114/76 | HR 81 | Temp 98.3°F | Ht 73.0 in | Wt 248.8 lb

## 2020-06-01 DIAGNOSIS — M47816 Spondylosis without myelopathy or radiculopathy, lumbar region: Secondary | ICD-10-CM

## 2020-06-01 DIAGNOSIS — M722 Plantar fascial fibromatosis: Secondary | ICD-10-CM | POA: Diagnosis not present

## 2020-06-01 DIAGNOSIS — M75102 Unspecified rotator cuff tear or rupture of left shoulder, not specified as traumatic: Secondary | ICD-10-CM

## 2020-06-01 DIAGNOSIS — M4722 Other spondylosis with radiculopathy, cervical region: Secondary | ICD-10-CM | POA: Diagnosis not present

## 2020-06-01 MED ORDER — MELOXICAM 15 MG PO TABS: 15.0000 mg | ORAL_TABLET | Freq: Every day | ORAL | 0 refills | Status: DC

## 2020-06-01 MED ORDER — DULOXETINE HCL 60 MG PO CPEP: 60.0000 mg | ORAL_CAPSULE | Freq: Every day | ORAL | 3 refills | Status: DC

## 2020-06-01 NOTE — Patient Instructions (Signed)
-  Transition to duloxetine 60 mg daily -Start new medication meloxicam once daily (take with food) until follow-up -Start formal physical therapy with referral provided and conduct home exercises -Return for follow-up in 6 weeks, contact us for any questions between now and then
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
## 2020-06-01 NOTE — Assessment & Plan Note (Signed)
Degenerative changes noted on radiology report, clinically reassuring exam with mild paraspinal spasm, tightness throughout the core and lower extremities and negative straight leg raise.  Medications as discussed, physical therapy, home exercises, and follow-up in 6 weeks.  If persistently symptomatic, advanced imaging at appropriate follow-up treatment such as cortisone injections can be considered.

## 2020-06-01 NOTE — Assessment & Plan Note (Signed)
Acute on chronic plantar fasciitis left greater than right, he has modified shoe wear, examination is focal to the calcaneus at the plantar fascia.  I have reviewed the importance of home and formal rehab for this, additionally he will be placed on meloxicam, reevaluation in 6 weeks.  If limited response, local ultrasound-guided injection to be considered, can also consider night splints, heel cup inserts pending clinical picture at return.

## 2020-06-01 NOTE — Assessment & Plan Note (Signed)
He does have outside films that do reveal multilevel moderate degenerative changes with focality to the C5-C6 junction, I am unable to review images however his physical exam is consistent with radiculopathy on the left side in the same distribution.  I have advised him of the same as well as various surgical and nonsurgical treatment strategies.  We will titrate his Cymbalta to 60 mg daily, initiate meloxicam scheduled for 6 weeks, and have him start a formal physical therapy program as well as home exercises.  He is to return for follow-up in 6 weeks.  If limited response, anticipate MRI imaging and consideration of corticosteroid injections.  If improved, gradual transition to fully home-based program, wean from meloxicam, anticipate continuation of duloxetine.

## 2020-06-01 NOTE — Assessment & Plan Note (Signed)
Relapsing and remitting left shoulder pain, atraumatic, this is in the setting of known multilevel cervical spondylosis with radiculopathy and degenerative changes of the left shoulder glenohumeral and acromioclavicular joints.  That being said, his examination is very reassuring, rotator cuff strength preserved, pain during isolated supraspinatus testing and muscle spasm noted in the periscapular and paraspinal regions.  Medications as discussed, I anticipate this to improve/resolve once his cervical symptoms have been addressed

## 2020-06-05 ENCOUNTER — Other Ambulatory Visit: Payer: Self-pay | Admitting: Family

## 2020-06-05 DIAGNOSIS — I10 Essential (primary) hypertension: Secondary | ICD-10-CM

## 2020-06-22 ENCOUNTER — Other Ambulatory Visit: Payer: Self-pay | Admitting: Family

## 2020-06-22 ENCOUNTER — Other Ambulatory Visit: Payer: Self-pay | Admitting: Pulmonary Disease

## 2020-06-22 DIAGNOSIS — I10 Essential (primary) hypertension: Secondary | ICD-10-CM

## 2020-06-22 NOTE — Telephone Encounter (Signed)
Rx request sent to pharmacy.  

## 2020-06-30 ENCOUNTER — Ambulatory Visit: Payer: Medicaid Other | Admitting: Physical Therapy

## 2020-07-05 DIAGNOSIS — G4733 Obstructive sleep apnea (adult) (pediatric): Secondary | ICD-10-CM | POA: Diagnosis not present

## 2020-07-07 ENCOUNTER — Ambulatory Visit: Payer: Medicaid Other | Attending: Family Medicine | Admitting: Physical Therapy

## 2020-07-07 ENCOUNTER — Encounter: Payer: Self-pay | Admitting: Physical Therapy

## 2020-07-07 ENCOUNTER — Other Ambulatory Visit: Payer: Self-pay

## 2020-07-07 DIAGNOSIS — R262 Difficulty in walking, not elsewhere classified: Secondary | ICD-10-CM | POA: Insufficient documentation

## 2020-07-07 DIAGNOSIS — M79672 Pain in left foot: Secondary | ICD-10-CM | POA: Insufficient documentation

## 2020-07-07 DIAGNOSIS — M79671 Pain in right foot: Secondary | ICD-10-CM | POA: Insufficient documentation

## 2020-07-07 DIAGNOSIS — M545 Low back pain, unspecified: Secondary | ICD-10-CM | POA: Diagnosis not present

## 2020-07-07 NOTE — Therapy (Signed)
[place] [facility] [address] [place], [place], [address] Phone: [phone]   Fax:  [phone]  Physical Therapy Evaluation  Patient Details  Name: Luis Mcknight MRN: [id number] Date of Birth: 01/24/1981 Referring Provider (PT): Rosette Reveal    Encounter Date: 07/07/2020   PT End of Session - 07/07/20 1433    Visit Number 1    Number of Visits 12    Date for PT Re-Evaluation 08/18/20    Authorization Type IE 07/07/2020    PT Start Time 1015    PT Stop Time 1055    PT Time Calculation (min) 40 min    Activity Tolerance Patient tolerated treatment well    Behavior During Therapy [facility] for tasks assessed/performed;Anxious           Past Medical History:  Diagnosis Date  . Anxiety   . Arthritis   . Depression    NO MEDS  . GERD (gastroesophageal reflux disease)    NO MEDS  . Hypertension   . OSA on CPAP   . Pneumonia    JUNE 2020  . Sleep apnea     Past Surgical History:  Procedure Laterality Date  . ELECTROMAGNETIC NAVIGATION BROCHOSCOPY Left 10/08/2018   Procedure: ELECTROMAGNETIC NAVIGATION BRONCHOSCOPY, LEFT;  Surgeon: Tyler Pita, MD;  Location: [facility] ORS;  Service: Cardiopulmonary;  Laterality: Left;  . ENDOBRONCHIAL ULTRASOUND Left 10/08/2018   Procedure: ENDOBRONCHIAL ULTRASOUND, LEFT;  Surgeon: Tyler Pita, MD;  Location: [facility] ORS;  Service: Cardiopulmonary;  Laterality: Left;  . NO PAST SURGERIES      There were no vitals filed for this visit.    Subjective Assessment - 07/07/20 1021    Subjective Patient referred to PT for L plantar fasciitis, cervical spondylosis with radiculopathy, supraspinatus syndrome L, and lumbar spondylosis. Patient prioritizes B heel pain/plantar fasciitis and low back pain that radiates into the sides.    Pertinent History Patient has had both issues for > 1 year. 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 Patient  worked 12 hour shifts and was on his feet throughout that time which was when his B heel pain was worsened. Patient notes back pain radiates around B flanks. Patient sleeps on his stomach which is his only comfortable position as laying on his side exacerbates his neck and L shoulder/UE pain.    Limitations Lifting;Standing;House hold activities;Walking    Currently in Pain? Yes    Pain Score 4     Pain Location Back    Pain Orientation Lower    Pain Descriptors / Indicators Aching    Pain Frequency Intermittent    Aggravating Factors  standing, walking, sleeping on the stomach    Pain Relieving Factors hot soak, lying down    Multiple Pain Sites Yes    Pain Score 6    Pain Location Foot    Pain Orientation Left;Right    Pain Descriptors / Indicators Throbbing    Pain Onset More than a month ago    Pain Frequency Constant    Aggravating Factors  standing, walking    Pain Relieving Factors laying down              [facility] PT Assessment - 07/07/20 0001      Assessment   Referring Provider (PT) Rosette Reveal    Hand Dominance Right    Prior Therapy None      Balance Screen   Has the patient  fallen in the past 6 months Yes    How many times? 1          24 hour pain behavior: waking up with more pain in the back; feet are constant How long can you sit: back- 15 min How long can you stand: feet- less than 5 minutes How long can you walk: feet- 30 min  Occupational demands: school, parental duties  Current activities: bike riding  Patient stated goals: hiking, bike riding, return to work  [place]: Yes Have you had any night sweats? yes Unexplained weight loss? No Saddle anesthesia? none Unexplained changes in bowel or bladder habits? 3 weeks of constipation, bloody stool. With laxative 2x/day (sensation of incomplete emptying), Straining positive. Baseline 1x/day  Mental Status Patient is oriented to person, place and time.  Recent memory is intact.   Remote memory is intact.  Attention span and concentration are intact.  Expressive speech is intact.  Patient's fund of knowledge is within normal limits for educational level.  POSTURE/OBSERVATIONS:  Lumbar lordosis: mildly increased Thoracic kyphosis: WNL Iliac crest height: equal bilaterally Lumbar lateral shift: negative Pelvic obliquity: negative Leg length discrepancy: negative  Posture guarded overall with increased anterior chain shortening in sitting.  GAIT: Grossly WFL, patient ambulates with increased forefoot contact likely to decrease heel strike and pain exacerbation  RANGE OF MOTION:    LEFT RIGHT  Lumbar forward flexion (65):  WNL*    Lumbar extension (30): WNL    Lumbar lateral flexion (25):  WNL* WNL*  Thoracic and Lumbar rotation (30):    WNL WNL  Hip Flexion (125):   WNL WNL  Hip IR (45):  WNL* WNL*  Hip ER (45):  WNL* WNL*  Hip Abduction (40):  WNL WNL  Hip extension (15):  WNL WNL  Knee flexion (135):  WNL WNL  Knee extension (0):  WNL WNL  Plantarflexion (20-50):  WNL WNL  Dorsiflexion (0-20):  WNL WNL  Ankle inversion (30):  WNL WNL  Ankle eversion (15):  WNL WNL    SENSATION: Grossly intact to light touch bilateral LEs as determined by testing dermatomes L2-S2 Proprioception and hot/cold testing deferred on this date  STRENGTH: MMT   RLE LLE  Hip Flexion 5 5  Hip Extension 5 5  Hip Abduction  5 5  Hip Adduction  5 5  Hip ER  5 5  Hip IR  5 5  Knee Extension 5 5  Knee Flexion 5 5  Dorsiflexion  5 5  Plantarflexion (seated) 5 5     PALPATION: TTP over B plantar surfaces at medial and lateral aspects of calcaneus and along medial aspect of longitudinal arch with greatest pain at heel.   SPECIAL TESTS Centralization and Peripheralization (SN 92, -LR 0.12): Negative Slump (SN 83, -LR 0.32): R: Negative L: Negative SLR (SN 92, -LR 0.29): R: Negative L:  Negative Crossed SLR (SP 90): R: Negative L: Negative FABER (SN 81): R: Positive  L: Positive FADDIR (SN 94): R: Positive L: Positive  FUNCTIONAL TESTS: Sit to stand: Negative    ASSESSMENT Patient is a 40 year old presenting to clinic with chief complaints of B plantar fasciitis, low back pain, neck pain with LUE symptoms, and R knee pain. Evaluation today does not reveal any concerning loss of strength or mobility in the lumbar spine or BLE; however patient does present with signs of central sensitization. Patient does have pain with lumbar flexion and side bending and pain along the plantar surface  of B feet. Patient has limited tolerance to activity as a result of pain. Patient's progress may be limited due to persistence and complexity of pain; however, patient's motivation is advantageous. Patient was able to achieve basic understanding of self-mobilization of plantar surface of the feet for decreased tension during today's evaluation and responded positively to "What is Pain?" handout provided in clinic. Patient will benefit from continued skilled therapeutic intervention to address deficits in pain, nervous system downregulation, posture, and mobility in order to increase function and improve overall QOL.  EDUCATION Patient educated on prognosis, POC, and provided with HEP including: plantar fascia mobilization, seated soleus stretch, and hooklying trunk rotations. Patient articulated understanding and returned demonstration. Patient will benefit from further education in order to maximize compliance and understanding for long-term therapeutic gains.            Objective measurements completed on examination: See above findings.        PT Long Term Goals - 07/08/20 3329      PT LONG TERM GOAL #1   Title Patient will be independent with HEP in order to decrease ankle pain and increase strength in order to improve pain-free function at home and work.    Baseline IE: provided    Time 6    Period Weeks    Status New    Target Date 08/18/20      PT LONG  TERM GOAL #2   Title Patient will demonstrate improved function as evidenced by a score of (to be determined at next visit) on FOTO measure for full participation in activities at home and in the community.    Baseline IE: collected at next visit    Time 6    Period Weeks    Status New    Target Date 08/18/20      PT LONG TERM GOAL #3   Title Patient will decrease worst pain as reported on NPRS by at least 2 points to demonstrate clinically significant reduction in pain in order to restore/improve function and overall QOL.    Baseline IE: 8/10 B heels    Time 6    Period Weeks    Status New    Target Date 08/18/20      PT LONG TERM GOAL #4   Title Patient will report being able to return to activities including, but not limited to: standing and walking for > 2 hours with pain controlled and minimal limitation to indicate complete resolution of the chief complaint and return to prior level of participation at home and in the community.    Baseline IE: standing < 5 min, walking 30 min    Time 6    Period Weeks    Status New    Target Date 08/19/20      PT LONG TERM GOAL #5   Title Patient will demonstrate understanding of basic self-management/down-regulation of the nervous system for persistent pain condition and stress as evidenced by diaphragmatic breathing without cueing, body scan/progressive relaxation meditation, and improved sleep hygiene in order to transition to independent management of patient's chief complaint:persistent pain in multiple regions of the body.    Baseline Ie: not demonstrated    Time 6    Period Weeks    Status New    Target Date 08/18/20             Plan - 07/07/20 1434    Clinical Impression Statement Patient is a 40 year old presenting to clinic with chief complaints of  B plantar fasciitis, low back pain, neck pain with LUE symptoms, and R knee pain. Evaluation today does not reveal any concerning loss of strength or mobility in the lumbar spine or  BLE; however patient does present with signs of central sensitization. Patient does have pain with lumbar flexion and side bending and pain along the plantar surface of B feet. Patient has limited tolerance to activity as a result of pain. Patient's progress may be limited due to persistence and complexity of pain; however, patient's motivation is advantageous. Patient was able to achieve basic understanding of self-mobilization of plantar surface of the feet for decreased tension during today's evaluation and responded positively to "What is Pain?" handout provided in clinic. Patient will benefit from continued skilled therapeutic intervention to address deficits in pain, nervous system downregulation, posture, and mobility in order to increase function and improve overall QOL.    Personal Factors and Comorbidities Age;Behavior Pattern;Comorbidity 3+;Past/Current Experience;Time since onset of injury/illness/exacerbation;Profession    Comorbidities anxiety, arthritis, depression, GERD, OSA    Examination-Activity Limitations Bed Mobility;Sleep;Sit;Squat;Stairs;Stand;Locomotion Level;Lift    Examination-Participation Restrictions Occupation;Cleaning;Yard Work;Community Activity;Shop    Stability/Clinical Decision Making Evolving/Moderate complexity    Clinical Decision Making Moderate    Rehab Potential Fair    PT Frequency 2x / week    PT Duration 6 weeks    PT Treatment/Interventions Taping;Dry needling;Joint Manipulations;Spinal Manipulations;Manual techniques;Patient/family education;Neuromuscular re-education;Therapeutic exercise;Moist Heat;Cryotherapy;Electrical Stimulation    PT Next Visit Plan manual as needed, trunk stabilization, BLE stretching    PT Home Exercise Plan gentle mobilization of plantar surface, seated soleus stretch, hooklying trunk rotations    Consulted and Agree with Plan of Care Patient           Patient will benefit from skilled therapeutic intervention in order to  improve the following deficits and impairments:  Pain,Improper body mechanics,Difficulty walking,Hypomobility,Decreased activity tolerance  Visit Diagnosis: Bilateral foot pain  Low back pain, unspecified back pain laterality, unspecified chronicity, unspecified whether sciatica present  Difficulty in walking, not elsewhere classified     Problem List Patient Active Problem List   Diagnosis Date Noted  . Cervical spondylosis with radiculopathy 06/01/2020  . Supraspinatus syndrome, 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  . Lumbar spondylosis 06/01/2020  . Plantar fasciitis, 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  . Chest pain of uncertain etiology [id number]  . OSA (obstructive sleep apnea) 06/04/2019  . Current smoker 06/04/2019  . Healthcare maintenance 06/04/2019  . Acute pericarditis 04/03/2019  . Sarcoidosis 10/16/2018  . Lung nodules   . Adenopathy   . Essential hypertension 11/28/2017   Myles Gip PT, DPT [phone]  07/07/2020, 2:43 PM  [place] [address] [place], [place], [address] Phone: [phone]   Fax:  [phone]  Name: Kas Shyne MRN: [id number] Date of Birth: August 18, 1980

## 2020-07-09 ENCOUNTER — Ambulatory Visit: Payer: Medicaid Other | Admitting: Physical Therapy

## 2020-07-14 ENCOUNTER — Ambulatory Visit: Payer: Medicaid Other | Admitting: Physical Therapy

## 2020-07-14 ENCOUNTER — Other Ambulatory Visit: Payer: Self-pay

## 2020-07-14 ENCOUNTER — Ambulatory Visit (INDEPENDENT_AMBULATORY_CARE_PROVIDER_SITE_OTHER): Payer: Medicaid Other | Admitting: Family Medicine

## 2020-07-14 ENCOUNTER — Encounter: Payer: Self-pay | Admitting: Physical Therapy

## 2020-07-14 ENCOUNTER — Encounter: Payer: Self-pay | Admitting: Family Medicine

## 2020-07-14 VITALS — BP 110/62 | HR 87 | Temp 98.2°F | Ht 73.0 in | Wt 250.4 lb

## 2020-07-14 DIAGNOSIS — M545 Low back pain, unspecified: Secondary | ICD-10-CM | POA: Diagnosis not present

## 2020-07-14 DIAGNOSIS — M4722 Other spondylosis with radiculopathy, cervical region: Secondary | ICD-10-CM

## 2020-07-14 DIAGNOSIS — M722 Plantar fascial fibromatosis: Secondary | ICD-10-CM | POA: Diagnosis not present

## 2020-07-14 DIAGNOSIS — R262 Difficulty in walking, not elsewhere classified: Secondary | ICD-10-CM

## 2020-07-14 DIAGNOSIS — M79671 Pain in right foot: Secondary | ICD-10-CM

## 2020-07-14 DIAGNOSIS — M47816 Spondylosis without myelopathy or radiculopathy, lumbar region: Secondary | ICD-10-CM | POA: Diagnosis not present

## 2020-07-14 DIAGNOSIS — M79672 Pain in left foot: Secondary | ICD-10-CM | POA: Diagnosis not present

## 2020-07-14 DIAGNOSIS — M75102 Unspecified rotator cuff tear or rupture of left shoulder, not specified as traumatic: Secondary | ICD-10-CM

## 2020-07-14 MED ORDER — TRIAMCINOLONE ACETONIDE 40 MG/ML IJ SUSP
40.0000 mg | Freq: Once | INTRAMUSCULAR | Status: AC
  Administered 2020-07-14: 40 mg

## 2020-07-14 MED ORDER — DICLOFENAC SODIUM 50 MG PO TBEC: 50.0000 mg | DELAYED_RELEASE_TABLET | Freq: Two times a day (BID) | ORAL | 1 refills | Status: DC | PRN

## 2020-07-14 NOTE — Therapy (Signed)
[facility] [address] [place], [place], [address] Phone: [phone]   Fax:  [phone]  Physical Therapy Treatment  Patient Details  Name: Luis Mcknight MRN: [id number] Date of Birth: 04-19-1980 Referring Provider (PT): Rosette Reveal   Encounter Date: 07/14/2020   PT End of Session - 07/14/20 0957    Visit Number 2    Number of Visits 12    Date for PT Re-Evaluation 08/18/20    Authorization Type IE 07/07/2020    PT Start Time 0930    PT Stop Time 1010    PT Time Calculation (min) 40 min    Activity Tolerance Patient tolerated treatment well    Behavior During Therapy [facility] for tasks assessed/performed           Past Medical History:  Diagnosis Date  . Anxiety   . Arthritis   . Depression    NO MEDS  . GERD (gastroesophageal reflux disease)    NO MEDS  . Hypertension   . OSA on CPAP   . Pneumonia    JUNE 2020  . Sleep apnea     Past Surgical History:  Procedure Laterality Date  . ELECTROMAGNETIC NAVIGATION BROCHOSCOPY Left 10/08/2018   Procedure: ELECTROMAGNETIC NAVIGATION BRONCHOSCOPY, LEFT;  Surgeon: Tyler Pita, MD;  Location: [facility] ORS;  Service: Cardiopulmonary;  Laterality: Left;  . ENDOBRONCHIAL ULTRASOUND Left 10/08/2018   Procedure: ENDOBRONCHIAL ULTRASOUND, LEFT;  Surgeon: Tyler Pita, MD;  Location: [facility] ORS;  Service: Cardiopulmonary;  Laterality: Left;  . NO PAST SURGERIES      There were no vitals filed for this visit.   Subjective Assessment - 07/14/20 0957    Subjective Patient reports that his pain is well managed this morning. He denies any concerns, but also doesn't note any significant changes since evaluation.    Pertinent History Patient has had both issues for > 1 year. 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 Patient worked 12 hour shifts and was on his feet throughout that time which was when his B heel pain was  worsened. Patient notes back pain radiates around B flanks. Patient sleeps on his stomach which is his only comfortable position as laying on his side exacerbates his neck and L shoulder/UE pain.    Limitations Lifting;Standing;House hold activities;Walking    Currently in Pain? No/denies    Pain Onset More than a month ago          TREATMENT  Manual Therapy: STM and TPR performed to B calves and lumbar paraspinal mm to allow for decreased tension and pain and improved posture and function CPA/UPA T11-L5 mobilizations for decreased spasm and improved mobility, grade II/III  Neuromuscular Re-education: Seated elliptical, Seat 16, L4.0, x6 min for graded exposure to movement and nervous system downregulation  Supine hooklying trunk rotations with coordinated breath for improved lumbar mobility and decreased pain Supine single knee to chest with diaphragmatic breath for improved lumbar mobility and decreased pain, BLE Supine double knee to chest with diaphragmatic breath for improved lumbar mobility and decreased pain Supine pelvic tilts with coordinated breath for improved lumbar mobility and decreased pain Supine figure 4 stretch, BLE, for improved mobility and decreased pain   Treatments unbilled: MHP to lumbar during manual interventions of B gastroc/soleus complex   Patient educated throughout session on appropriate technique and form using multi-modal cueing, HEP, and activity modification. Patient articulated understanding and returned demonstration.  Patient Response to  interventions: "I feel good"  ASSESSMENT Patient presents to clinic with excellent motivation to participate in therapy. Patient demonstrates deficits in pain, nervous system downregulation, posture, and mobility. Patient able to achieve pain controlled state with manual interventions and gentle stretching during today's session and responded positively to active and passive interventions. Patient will benefit  from continued skilled therapeutic intervention to address remaining deficits in pain, nervous system downregulation, posture, and mobility in order to increase function and improve overall QOL.     PT Long Term Goals - 07/08/20 2706      PT LONG TERM GOAL #1   Title Patient will be independent with HEP in order to decrease ankle pain and increase strength in order to improve pain-free function at home and work.    Baseline IE: provided    Time 6    Period Weeks    Status New    Target Date 08/18/20      PT LONG TERM GOAL #2   Title Patient will demonstrate improved function as evidenced by a score of (to be determined at next visit) on FOTO measure for full participation in activities at home and in the community.    Baseline IE: collected at next visit    Time 6    Period Weeks    Status New    Target Date 08/18/20      PT LONG TERM GOAL #3   Title Patient will decrease worst pain as reported on NPRS by at least 2 points to demonstrate clinically significant reduction in pain in order to restore/improve function and overall QOL.    Baseline IE: 8/10 B heels    Time 6    Period Weeks    Status New    Target Date 08/18/20      PT LONG TERM GOAL #4   Title Patient will report being able to return to activities including, but not limited to: standing and walking for > 2 hours with pain controlled and minimal limitation to indicate complete resolution of the chief complaint and return to prior level of participation at home and in the community.    Baseline IE: standing < 5 min, walking 30 min    Time 6    Period Weeks    Status New    Target Date 08/19/20      PT LONG TERM GOAL #5   Title Patient will demonstrate understanding of basic self-management/down-regulation of the nervous system for persistent pain condition and stress as evidenced by diaphragmatic breathing without cueing, body scan/progressive relaxation meditation, and improved sleep hygiene in order to transition to  independent management of patient's chief complaint:persistent pain in multiple regions of the body.    Baseline Ie: not demonstrated    Time 6    Period Weeks    Status New    Target Date 08/18/20                 Plan - 07/14/20 0958    Clinical Impression Statement Patient presents to clinic with excellent motivation to participate in therapy. Patient demonstrates deficits in pain, nervous system downregulation, posture, and mobility. Patient able to achieve pain controlled state with manual interventions and gentle stretching during today's session and responded positively to active and passive interventions. Patient will benefit from continued skilled therapeutic intervention to address remaining deficits in pain, nervous system downregulation, posture, and mobility in order to increase function and improve overall QOL.    Personal Factors and Comorbidities Age;Behavior Pattern;Comorbidity 3+;Past/Current  Experience;Time since onset of injury/illness/exacerbation;Profession    Comorbidities anxiety, arthritis, depression, GERD, OSA    Examination-Activity Limitations Bed Mobility;Sleep;Sit;Squat;Stairs;Stand;Locomotion Level;Lift    Examination-Participation Restrictions Occupation;Cleaning;Yard Work;Community Activity;Shop    Stability/Clinical Decision Making Evolving/Moderate complexity    Rehab Potential Fair    PT Frequency 2x / week    PT Duration 6 weeks    PT Treatment/Interventions Taping;Dry needling;Joint Manipulations;Spinal Manipulations;Manual techniques;Patient/family education;Neuromuscular re-education;Therapeutic exercise;Moist Heat;Cryotherapy;Electrical Stimulation    PT Next Visit Plan manual as needed, trunk stabilization, BLE stretching    PT Home Exercise Plan gentle mobilization of plantar surface, seated soleus stretch, hooklying trunk rotations    Consulted and Agree with Plan of Care Patient           Patient will benefit from skilled therapeutic  intervention in order to improve the following deficits and impairments:  Pain,Improper body mechanics,Difficulty walking,Hypomobility,Decreased activity tolerance  Visit Diagnosis: Bilateral foot pain  Low back pain, unspecified back pain laterality, unspecified chronicity, unspecified whether sciatica present  Difficulty in walking, not elsewhere classified     Problem List Patient Active Problem List   Diagnosis Date Noted  . Plantar fasciitis, right 07/14/2020  . Cervical spondylosis with radiculopathy 06/01/2020  . Supraspinatus syndrome, 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  . Lumbar spondylosis 06/01/2020  . Plantar fasciitis, 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  . Chest pain of uncertain etiology 02/27/8249  . OSA (obstructive sleep apnea) 06/04/2019  . Current smoker 06/04/2019  . Healthcare maintenance 06/04/2019  . Acute pericarditis 04/03/2019  . Sarcoidosis 10/16/2018  . Lung nodules   . Adenopathy   . Essential hypertension 11/28/2017   Myles Gip PT, DPT [phone]  07/14/2020, 3:58 PM  [facility] [address] [place], [place], [address] Phone: [phone]   Fax:  [phone]  Name: Luis Mcknight MRN: [id number] Date of Birth: [birth year]

## 2020-07-14 NOTE — Progress Notes (Signed)
Primary Care / Sports Medicine Office Visit  Patient Information:  Patient ID: Luis Mcknight, male DOB: [birth year] Age: 40 y.o. MRN: [id number]   Luis Mcknight is a pleasant 40 y.o. male presenting with the following:  Chief Complaint  Patient presents with  . Follow-up  . Cervical spondylosis with radiculopathy    Doing about the same as last visit; not taking Duloxetine or Meloxicam due to blood pressure changes and headaches per patient, Started Physical Therapy today and tolerated session well; takes prednisone 10 mg daily for sarcoidosis; 5/10 pain in office  . Plantar Fasciitis    Bilateral; 5/10 pain    Review of Systems pertinent details above   Patient Active Problem List   Diagnosis Date Noted  . Plantar fasciitis, right 07/14/2020  . Cervical spondylosis with radiculopathy 06/01/2020  . Supraspinatus syndrome, 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  . Lumbar spondylosis 06/01/2020  . Plantar fasciitis, 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  . Chest pain of uncertain etiology 99/37/1696  . OSA (obstructive sleep apnea) 06/04/2019  . Current smoker 06/04/2019  . Healthcare maintenance 06/04/2019  . Acute pericarditis 04/03/2019  . Sarcoidosis 10/16/2018  . Lung nodules   . Adenopathy   . Essential hypertension 11/28/2017   Past Medical History:  Diagnosis Date  . Anxiety   . Arthritis   . Depression    NO MEDS  . GERD (gastroesophageal reflux disease)    NO MEDS  . Hypertension   . OSA on CPAP   . Pneumonia    JUNE 2020  . Sleep apnea    Outpatient Medications Prior to Visit  Medication Sig Dispense Refill  . acetaminophen (TYLENOL) 500 MG tablet Take 500-1,000 mg by mouth every 6 (six) hours as needed.    Marland Kitchen amLODipine (NORVASC) 10 MG tablet Take 10 mg by mouth every morning. Cardiology    . pantoprazole (PROTONIX) 20 MG tablet Take 1 tablet (20 mg total) by mouth daily. 90 tablet 1  . predniSONE (DELTASONE) 10 MG tablet TAKE 1 TABLET(10 MG) BY MOUTH DAILY WITH BREAKFAST 30 tablet 1  .  triamterene-hydrochlorothiazide (MAXZIDE) 75-50 MG tablet TAKE 1 TABLET BY MOUTH DAILY 30 tablet 2  . UNABLE TO FIND CPAP AT NIGHT    . DULoxetine (CYMBALTA) 60 MG capsule Take 1 capsule (60 mg total) by mouth daily. (Patient not taking: Reported on 07/14/2020) 30 capsule 3  . meloxicam (MOBIC) 15 MG tablet Take 1 tablet (15 mg total) by mouth daily. (Patient not taking: Reported on 07/14/2020) 45 tablet 0   No facility-administered medications prior to visit.    Past Surgical History:  Procedure Laterality Date  . ELECTROMAGNETIC NAVIGATION BROCHOSCOPY Left 10/08/2018   Procedure: ELECTROMAGNETIC NAVIGATION BRONCHOSCOPY, LEFT;  Surgeon: Tyler Pita, MD;  Location: [facility] ORS;  Service: Cardiopulmonary;  Laterality: Left;  . ENDOBRONCHIAL ULTRASOUND Left 10/08/2018   Procedure: ENDOBRONCHIAL ULTRASOUND, LEFT;  Surgeon: Tyler Pita, MD;  Location: [facility] ORS;  Service: Cardiopulmonary;  Laterality: Left;  . NO PAST SURGERIES     Social History   Socioeconomic History  . Marital status: Married    Spouse name: Jessup Ogas  . Number of children: 4  . Years of education: 12+  . Highest education level: Some college, no degree  Occupational History  . Occupation: Unemployed  . Occupation: Contractor    Comment: Photography  Tobacco Use  . Smoking status: Current Some Day Smoker    Packs/day: 0.25    Years: 20.00    Pack years: 5.00  Types: Cigarettes    Start date: 03/07/1998    Last attempt to quit: 08/06/2019    Years since quitting: 0.9  . Smokeless tobacco: Never Used  . Tobacco comment: down to 6 cigarettes a day   Vaping Use  . Vaping Use: Former  . Start date: 04/18/2012  . Quit date: 04/18/2016  Substance and Sexual Activity  . Alcohol use: Not Currently  . Drug use: Not Currently  . Sexual activity: Yes  Other Topics Concern  . Not on file  Social History Narrative  . Not on file   Social Determinants of Health   Financial Resource Strain: Not  on file  Food Insecurity: Not on file  Transportation Needs: Not on file  Physical Activity: Not on file  Stress: Not on file  Social Connections: Not on file  Intimate Partner Violence: Not on file   Family History  Problem Relation Age of Onset  . Hypertension Mother   . Diabetes Mother   . Diabetes Father   . Hypertension Father   . Heart attack Father 56  . Lung cancer Maternal Grandmother    Allergies  Allergen Reactions  . Latex Itching    GLOVES    Vitals:   07/14/20 1021  BP: 110/62  Pulse: 87  Temp: 98.2 F (36.8 C)  SpO2: 97%   Vitals:   07/14/20 1021  Weight: 250 lb 6.4 oz (113.6 kg)  Height: 6\' 1"  (4.010 m)   Body mass index is 33.04 kg/m.  No results found.   Independent interpretation of notes and tests performed by another provider:   None  Procedures performed:   Procedure:  Injection of left plantar fascia at calcaneal tuberosity under ultrasound guidance for confirmation of needle placement. Samsung HS60 device utilized with permanent recording / reporting. Consent obtained and verified. Noted no overlying erythema, induration, or other signs of local infection. Skin prepped in a sterile fashion. Ethyl chloride spray for topical analgesia.  Completed without difficulty and tolerated well. Medication: triamcinolone acetonide 40 mg/mL suspension for injection 1 mL total Advised to contact for fevers/chills, erythema, induration, drainage, or persistent bleeding.  Procedure:  Injection of right plantar fascia at the calcaneal tuberosity under ultrasound guidance for confirmation of needle placement. Samsung HS60 device utilized with permanent recording / reporting. Consent obtained and verified. Noted no overlying erythema, induration, or other signs of local infection. Skin prepped in a sterile fashion. Ethyl chloride spray for topical analgesia.  Completed without difficulty and tolerated well. Medication: triamcinolone acetonide 40  mg/mL suspension for injection 1 mL total Advised to contact for fevers/chills, erythema, induration, drainage, or persistent bleeding.   Pertinent History, Exam, Impression, and Recommendations:   Supraspinatus syndrome, left Patient with interval improvement in his stated symptoms, physical exam findings shortness positive though improved Neer's and Hawkins testing.  He has completed 1 session of physical therapy.  At this stage he is amenable to continuing physical therapy, will transition from scheduled meloxicam to as needed diclofenac 50 mg.  He is to return for follow-up in 6 weeks for reevaluation.  Cervical spondylosis with radiculopathy Patient with stable symptoms at the neck, still noting intermittent left upper extremity paresthesias without weakness.  His physical exam findings are stable, given his limited PT thus far, he will continue with physical therapy, dose of diclofenac on an as-needed basis, and return for follow-up in 6 weeks for reevaluation.  Lumbar spondylosis Symptoms stable, continue PT and medications  Plantar fasciitis, left Patient has noted increased symptoms  at his left greater than right plantar fascia, this is despite recent meloxicam and physical therapy.  I did review additional treatment strategies and he is amenable to ultrasound-guided injections of the plantar fascia at the calcaneal tuberosity which we did perform on both the right and left plantar fascia today.  He tolerated the procedure well and appropriate postinjection care outlined.  He is to continue with physical therapy and return for follow-up in 6 weeks.  Plantar fasciitis, right Patient has noted increased symptoms at his left greater than right plantar fascia, this is despite recent meloxicam and physical therapy.  I did review additional treatment strategies and he is amenable to ultrasound-guided injections of the plantar fascia at the calcaneal tuberosity which we did perform on both the  right and left plantar fascia today.  He tolerated the procedure well and appropriate postinjection care outlined.  He is to continue with physical therapy and return for follow-up in 6 weeks.    Orders & Medications Meds ordered this encounter  Medications  . diclofenac (VOLTAREN) 50 MG EC tablet    Sig: Take 1 tablet (50 mg total) by mouth 2 (two) times daily as needed for mild pain or moderate pain.    Dispense:  60 tablet    Refill:  1  . triamcinolone acetonide (KENALOG-40) injection 40 mg  . triamcinolone acetonide (KENALOG-40) injection 40 mg   No orders of the defined types were placed in this encounter.    Return in about 6 weeks (around 08/25/2020), or Patient also requests primary care follow-up visit with Dr. Ronnald Ramp, for follow-up neck, shoulder, back, heels.     Montel Culver, MD   Primary Care Sports Medicine [place]

## 2020-07-14 NOTE — Assessment & Plan Note (Addendum)
Patient has noted increased symptoms at his left greater than right plantar fascia, this is despite recent meloxicam and physical therapy.  I did review additional treatment strategies and he is amenable to ultrasound-guided injections of the plantar fascia at the calcaneal tuberosity which we did perform on both the right and left plantar fascia today.  He tolerated the procedure well and appropriate postinjection care outlined.  He is to continue with physical therapy and return for follow-up in 6 weeks. 

## 2020-07-14 NOTE — Patient Instructions (Signed)
You have just been given a cortisone injection to reduce pain and inflammation. After the injection you may notice immediate relief of pain as a result of the Lidocaine. It is important to rest the area of the injection for 24 to 48 hours after the injection. There is a possibility of some temporary increased discomfort and swelling for up to 72 hours until the cortisone begins to work. If you do have pain, simply rest the joint and use ice.  - Dose diclofenac every 12 hours with food on an as-needed basis (no other NSAIDs while on this medication) - Continue with physical therapy and home exercises that they instruct you to perform - Return for follow-up in 6 weeks, contact us for any questions between now and then

## 2020-07-14 NOTE — Assessment & Plan Note (Signed)
Patient with interval improvement in his stated symptoms, physical exam findings shortness positive though improved Neer's and Hawkins testing.  He has completed 1 session of physical therapy.  At this stage he is amenable to continuing physical therapy, will transition from scheduled meloxicam to as needed diclofenac 50 mg.  He is to return for follow-up in 6 weeks for reevaluation.

## 2020-07-14 NOTE — Assessment & Plan Note (Signed)
Symptoms stable, continue PT and medications

## 2020-07-14 NOTE — Assessment & Plan Note (Addendum)
Patient has noted increased symptoms at his left greater than right plantar fascia, this is despite recent meloxicam and physical therapy.  I did review additional treatment strategies and he is amenable to ultrasound-guided injections of the plantar fascia at the calcaneal tuberosity which we did perform on both the right and left plantar fascia today.  He tolerated the procedure well and appropriate postinjection care outlined.  He is to continue with physical therapy and return for follow-up in 6 weeks.

## 2020-07-14 NOTE — Assessment & Plan Note (Signed)
Patient with stable symptoms at the neck, still noting intermittent left upper extremity paresthesias without weakness.  His physical exam findings are stable, given his limited PT thus far, he will continue with physical therapy, dose of diclofenac on an as-needed basis, and return for follow-up in 6 weeks for reevaluation.

## 2020-07-16 ENCOUNTER — Encounter: Payer: Medicaid Other | Admitting: Physical Therapy

## 2020-07-23 ENCOUNTER — Telehealth: Payer: Self-pay

## 2020-07-23 ENCOUNTER — Other Ambulatory Visit: Payer: Self-pay

## 2020-07-23 NOTE — Telephone Encounter (Signed)
Pt called and stated the police was able to find him and talk to him. Pt said that the police officer suggested that he reach out to [place] to talk to someone. Pt called the office to who he could talk to. Pt called [facility] they told the pt to come in tomorrow 5/20 but it could take hours to see him. I told pt that [facility] is a walk in clinic even though it may take a while they are one of the few places that take walk ins. Also told pt if he needed to talk to someone today that he could go to the ER and talk to the psychiatrist that's there today. Pt verbalized understanding.  KP

## 2020-07-23 NOTE — Telephone Encounter (Signed)
Copied from [place] [phone]. Topic: General - Other >> Jul 23, 2020 10:55 AM Keene Breath wrote: Reason for CRM: Patient called to inform the nurse or doctor that the Cymbalta that was prescribed is not helping the patient.  Patient stated he would like a referral for a specialist for depression or would like to know what else he can do.  Please advise

## 2020-07-23 NOTE — Telephone Encounter (Signed)
Patient called the clinic requesting a Referral for psychiatry. Said his Cymbalta did not help him. He said he discussed this with Dr. Zigmund Daniel at his last appt about stopping his cymbalta due to ED, and suicidal thoughts.  Did a PHQ9 on patient and the patient stated he thinks about harming hisself everyday and he has a plan to do this. He did tell his wife this situation as well. He would not tell me his plan. Told patient first, that he needs to call 911 and they will take him to [facility] to see psychiatry on call.  He said he cannot do that at this time because he has his "children" with him and his wife is at work until 46PM.  He did not sound so sure that he would seek treatment. Kept telling me he "may not harm myself right now." Explained to him because he has a plan I do have to act on this and call his wife and 911.  Tried calling his wife and she did not answer- no voicemail.   Called 911 and explained the situation to the dispatcher. She said she is going to disconnect with me now, and call the patient at this time. Explained to her patient told me he was "out sitting in his car" but he tried to tell me he was not home.   She said they will contact the patient from here.   PHQ9 score - 27.  Patient says he cannot sit still, is restless, low energy, cannot focus, cannot sleep, has bad dreams, is suicidal. Says he stopped cymbalta a few months ago and weaned off the medication instead of stopping abruptly.

## 2020-07-28 ENCOUNTER — Ambulatory Visit: Payer: Medicaid Other | Admitting: Physical Therapy

## 2020-07-30 ENCOUNTER — Encounter: Payer: Medicaid Other | Admitting: Physical Therapy

## 2020-08-04 ENCOUNTER — Encounter: Payer: Medicaid Other | Admitting: Physical Therapy

## 2020-08-05 DIAGNOSIS — I10 Essential (primary) hypertension: Secondary | ICD-10-CM | POA: Diagnosis not present

## 2020-08-05 DIAGNOSIS — G4733 Obstructive sleep apnea (adult) (pediatric): Secondary | ICD-10-CM | POA: Diagnosis not present

## 2020-08-06 ENCOUNTER — Encounter: Payer: Medicaid Other | Admitting: Physical Therapy

## 2020-08-06 ENCOUNTER — Other Ambulatory Visit: Payer: Self-pay

## 2020-08-06 ENCOUNTER — Ambulatory Visit: Payer: Medicaid Other | Attending: Family Medicine | Admitting: Physical Therapy

## 2020-08-06 ENCOUNTER — Encounter: Payer: Self-pay | Admitting: Physical Therapy

## 2020-08-06 DIAGNOSIS — R262 Difficulty in walking, not elsewhere classified: Secondary | ICD-10-CM | POA: Diagnosis not present

## 2020-08-06 DIAGNOSIS — M79672 Pain in left foot: Secondary | ICD-10-CM | POA: Insufficient documentation

## 2020-08-06 DIAGNOSIS — M79671 Pain in right foot: Secondary | ICD-10-CM | POA: Diagnosis not present

## 2020-08-06 DIAGNOSIS — M545 Low back pain, unspecified: Secondary | ICD-10-CM | POA: Diagnosis not present

## 2020-08-06 NOTE — Therapy (Signed)
[facility] [address] [place], [place], [address] Phone: [phone]   Fax:  [phone]  Physical Therapy Treatment  Patient Details  Name: Luis Mcknight MRN: [id number] Date of Birth: 18-Jun-1980 Referring Provider (PT): Rosette Reveal   Encounter Date: 08/06/2020   PT End of Session - 08/06/20 1104    Visit Number 3    Number of Visits 12    Date for PT Re-Evaluation 08/18/20    Authorization Type IE 07/07/2020    Authorization - Visit Number 3    Progress Note Due on Visit 10    PT Start Time 0953    PT Stop Time 1050    PT Time Calculation (min) 57 min    Activity Tolerance Patient tolerated treatment well    Behavior During Therapy [facility] for tasks assessed/performed           Past Medical History:  Diagnosis Date  . Anxiety   . Arthritis   . Depression    NO MEDS  . GERD (gastroesophageal reflux disease)    NO MEDS  . Hypertension   . OSA on CPAP   . Pneumonia    JUNE 2020  . Sleep apnea     Past Surgical History:  Procedure Laterality Date  . ELECTROMAGNETIC NAVIGATION BROCHOSCOPY Left 10/08/2018   Procedure: ELECTROMAGNETIC NAVIGATION BRONCHOSCOPY, LEFT;  Surgeon: Tyler Pita, MD;  Location: [facility] ORS;  Service: Cardiopulmonary;  Laterality: Left;  . ENDOBRONCHIAL ULTRASOUND Left 10/08/2018   Procedure: ENDOBRONCHIAL ULTRASOUND, LEFT;  Surgeon: Tyler Pita, MD;  Location: [facility] ORS;  Service: Cardiopulmonary;  Laterality: Left;  . NO PAST SURGERIES      There were no vitals filed for this visit.   Subjective Assessment - 08/06/20 0956    Subjective Patient reports ongoing pain affecting bilat feet. He reports no major pain in low back at arrival to PT. Patient reports pain mainly concentrated in feet this AM and comorbid pain in palms of his hand. He reports atraumatic onset of pain in his hands. He states it hurts with supporting bodyweight with hands and with holding his phone. He has some  numbness in fingers from history of frostbite. Pt reports pain in heels at rest this AM.    Pertinent History Patient has had both issues for > 1 year. 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 Patient worked 12 hour shifts and was on his feet throughout that time which was when his B heel pain was worsened. Patient notes back pain radiates around B flanks. Patient sleeps on his stomach which is his only comfortable position as laying on his side exacerbates his neck and L shoulder/UE pain.    Limitations Lifting;Standing;House hold activities;Walking    Currently in Pain? Yes    Pain Score 5    in bilat feet mainly, 2-3/10 in low back   Pain Location Foot    Pain Onset More than a month ago          OBJECTIVE FINDINGS Ankle dorsiflexion PROM WFL bilaterally Great toe extension PROM WNL bilaterally  TTP along palmar aponeurosis region. No point tenderness. No erythema, bruising, or other integumentary changes; taut and tender L medial gastrocnemius with moderate referred pain to plantar arch/plantar aspect of calcaneus    TREATMENT   Manual Therapy: DTM/IASTM performed to B calves, B flexor hallucis brevis and quadratus plantae, and lumbar paraspinal mm L3-L5 to allow for decreased tension  and pain/sensitivity and improved posture and function  *not today* CPA/UPA T11-L5 mobilizations for decreased spasm and improved mobility, grade II/III   Neuromuscular Re-education: Seated elliptical, Seat 16, L4.0, x6 min for graded exposure to movement and nervous system downregulation  Supine hooklying trunk rotations with coordinated breath for improved lumbar mobility and decreased pain; 20x Supine hooklying trunk rotations with QL bias lumbar mobility and decreased pain; x10 ea LE Supine double knee to chest with diaphragmatic breath for improved lumbar mobility and decreased pain; x10, 1 sec hold Supine posterior pelvic tilts with  coordinated breath for improved lumbar mobility and decreased pain; 2x10  *not today* Supine figure 4 stretch, BLE, for improved mobility and decreased pain Supine single knee to chest with diaphragmatic breath for improved lumbar mobility and decreased pain, BLE   Therapeutic Exercise: Seated heel raise with great toe extension in towel roll, for graded exposure to plantar arch load; 20x     Patient educated throughout session on appropriate technique and form using multi-modal cueing, HEP, and activity modification. Patient articulated understanding and returned demonstration. Discussed with patient role of pain as "alarm" for nervous system and strategies for nervous system downregulation/desensitization.    Patient Response to interventions: Soreness remaining in calf muscles - no major report of plantar foot pain bothering him at end of session. Patient reports no significant low back pain at conclusion of session.         PT Long Term Goals - 07/08/20 8295      PT LONG TERM GOAL #1   Title Patient will be independent with HEP in order to decrease ankle pain and increase strength in order to improve pain-free function at home and work.    Baseline IE: provided    Time 6    Period Weeks    Status New    Target Date 08/18/20      PT LONG TERM GOAL #2   Title Patient will demonstrate improved function as evidenced by a score of (to be determined at next visit) on FOTO measure for full participation in activities at home and in the community.    Baseline IE: collected at next visit    Time 6    Period Weeks    Status New    Target Date 08/18/20      PT LONG TERM GOAL #3   Title Patient will decrease worst pain as reported on NPRS by at least 2 points to demonstrate clinically significant reduction in pain in order to restore/improve function and overall QOL.    Baseline IE: 8/10 B heels    Time 6    Period Weeks    Status New    Target Date 08/18/20      PT LONG TERM GOAL  #4   Title Patient will report being able to return to activities including, but not limited to: standing and walking for > 2 hours with pain controlled and minimal limitation to indicate complete resolution of the chief complaint and return to prior level of participation at home and in the community.    Baseline IE: standing < 5 min, walking 30 min    Time 6    Period Weeks    Status New    Target Date 08/19/20      PT LONG TERM GOAL #5   Title Patient will demonstrate understanding of basic self-management/down-regulation of the nervous system for persistent pain condition and stress as evidenced by diaphragmatic breathing without cueing, body scan/progressive relaxation meditation,  and improved sleep hygiene in order to transition to independent management of patient's chief complaint:persistent pain in multiple regions of the body.    Baseline Ie: not demonstrated    Time 6    Period Weeks    Status New    Target Date 08/18/20                 Plan - 08/06/20 1320    Clinical Impression Statement Patient has minimal complaint of low back pain during today's visit with mild sensitity to palpation noted during soft tissue mobilization along bilateral L3-L5 erector spinae and quadratus lumborum. Patient has no significant deficit in ankle or great toe extension ROM. Patient has good tolerance of manual work today with further soft tissue work on plantar fascia performed with use of Edge mobility tool. Patient to continue with weekly aerobic work, lower trunk rotation with QL bias, and established HEP at home. Will further progress HEP next visit for more robust home program given 1x/week visits. Patient will benefit from continued skilled PT intervetnion to address remaining deficits in pain, tolerance of weightbearing/loaded activity, and mobility as needed for best return to PLOF.    Personal Factors and Comorbidities Age;Behavior Pattern;Comorbidity 3+;Past/Current Experience;Time  since onset of injury/illness/exacerbation;Profession    Comorbidities anxiety, arthritis, depression, GERD, OSA    Examination-Activity Limitations Bed Mobility;Sleep;Sit;Squat;Stairs;Stand;Locomotion Level;Lift    Examination-Participation Restrictions Occupation;Cleaning;Yard Work;Community Activity;Shop    Stability/Clinical Decision Making Evolving/Moderate complexity    Rehab Potential Fair    PT Frequency 2x / week    PT Duration 6 weeks    PT Treatment/Interventions Taping;Dry needling;Joint Manipulations;Spinal Manipulations;Manual techniques;Patient/family education;Neuromuscular re-education;Therapeutic exercise;Moist Heat;Cryotherapy;Electrical Stimulation    PT Next Visit Plan manual as needed, trunk stabilization, BLE stretching    PT Home Exercise Plan gentle mobilization of plantar surface, seated soleus stretch, hooklying trunk rotations    Consulted and Agree with Plan of Care Patient           Patient will benefit from skilled therapeutic intervention in order to improve the following deficits and impairments:  Pain,Improper body mechanics,Difficulty walking,Hypomobility,Decreased activity tolerance  Visit Diagnosis: Bilateral foot pain  Low back pain, unspecified back pain laterality, unspecified chronicity, unspecified whether sciatica present  Difficulty in walking, not elsewhere classified     Problem List Patient Active Problem List   Diagnosis Date Noted  . Plantar fasciitis, right 07/14/2020  . Cervical spondylosis with radiculopathy 06/01/2020  . Supraspinatus syndrome, 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  . Lumbar spondylosis 06/01/2020  . Plantar fasciitis, 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  . Chest pain of uncertain etiology 58/52/7782  . OSA (obstructive sleep apnea) 06/04/2019  . Current smoker 06/04/2019  . Healthcare maintenance 06/04/2019  . Acute pericarditis 04/03/2019  . Sarcoidosis 10/16/2018  . Lung nodules   . Adenopathy   . Essential hypertension 11/28/2017   Valentina Gu, PT, DPT [id number] Eilleen Kempf 08/06/2020, 1:33 PM  [facility] [address] [place], [place], [address] Phone: [phone]   Fax:  [phone]  Name: Luis Mcknight MRN: [id number] Date of Birth: 01/01/1981

## 2020-08-11 ENCOUNTER — Other Ambulatory Visit: Payer: Self-pay

## 2020-08-11 ENCOUNTER — Ambulatory Visit: Payer: Medicaid Other | Admitting: Physical Therapy

## 2020-08-11 DIAGNOSIS — R262 Difficulty in walking, not elsewhere classified: Secondary | ICD-10-CM

## 2020-08-11 DIAGNOSIS — M79671 Pain in right foot: Secondary | ICD-10-CM | POA: Diagnosis not present

## 2020-08-11 DIAGNOSIS — M545 Low back pain, unspecified: Secondary | ICD-10-CM

## 2020-08-11 DIAGNOSIS — M79672 Pain in left foot: Secondary | ICD-10-CM | POA: Diagnosis not present

## 2020-08-11 NOTE — Therapy (Signed)
[place] [facility] [address] [place], [place], [address] Phone: [phone]   Fax:  [phone]  Physical Therapy Treatment  Patient Details  Name: Luis Mcknight MRN: [id number] Date of Birth: Mar 02, 1981 Referring Provider (PT): Rosette Reveal   Encounter Date: 08/11/2020   PT End of Session - 08/11/20 1256    Visit Number 4    Number of Visits 12    Date for PT Re-Evaluation 08/18/20    Authorization Type IE 07/07/2020    Authorization - Visit Number 4    Authorization - Number of Visits 4    Progress Note Due on Visit 10    PT Start Time 0955    PT Stop Time 1050    PT Time Calculation (min) 55 min    Activity Tolerance Patient tolerated treatment well    Behavior During Therapy [facility] for tasks assessed/performed           Past Medical History:  Diagnosis Date  . Anxiety   . Arthritis   . Depression    NO MEDS  . GERD (gastroesophageal reflux disease)    NO MEDS  . Hypertension   . OSA on CPAP   . Pneumonia    JUNE 2020  . Sleep apnea     Past Surgical History:  Procedure Laterality Date  . ELECTROMAGNETIC NAVIGATION BROCHOSCOPY Left 10/08/2018   Procedure: ELECTROMAGNETIC NAVIGATION BRONCHOSCOPY, LEFT;  Surgeon: Tyler Pita, MD;  Location: [facility] ORS;  Service: Cardiopulmonary;  Laterality: Left;  . ENDOBRONCHIAL ULTRASOUND Left 10/08/2018   Procedure: ENDOBRONCHIAL ULTRASOUND, LEFT;  Surgeon: Tyler Pita, MD;  Location: [facility] ORS;  Service: Cardiopulmonary;  Laterality: Left;  . NO PAST SURGERIES      There were no vitals filed for this visit.   Subjective Assessment - 08/11/20 0954    Subjective Patient reports ongoing pain affecting plantar surface of feet. Patient reports palmar pain is "not too much" at arrival to PT. Patient report some periscapular pain at arrival to PT. Patient reports not having as much low back pain at arrival to PT. Patient reports 139/89 BP this AM. He states upper quarter pain  is worse with increase in blood pressure. Pt reports 141/100 BP the previous evening. Pt reports more pain usually with weightbearing on his feet. Patient reports doing well with walking in grocery store, but static standing in line is very difficult.    Pertinent History Patient has had both issues for > 1 year. 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 Patient worked 12 hour shifts and was on his feet throughout that time which was when his B heel pain was worsened. Patient notes back pain radiates around B flanks. Patient sleeps on his stomach which is his only comfortable position as laying on his side exacerbates his neck and L shoulder/UE pain.    Limitations Lifting;Standing;House hold activities;Walking    How long can you stand comfortably? 15 minutes    Currently in Pain? Yes    Pain Score 4     Pain Location Foot    Pain Onset More than a month ago              PT Education - 08/11/20 1052    Education Details Reviewed pain neuroscience concepts. Discussed with patient continued aerobic work and HEP for nervous system downregulation. HEP update and review. Educated patient on ongoing BP monitoring and education on hypertensive emergency blood pressure  reading.    Person(s) Educated Patient    Methods Explanation    Comprehension Verbalized understanding           TREATMENT  Manual Therapy: DTM/IASTM performed toB calves, B flexor hallucis brevis and quadratus plantae, and lumbar paraspinal mmL3-L5 to allow for decreased tension and pain/sensitivity and improved posture and function  *not today* CPA/UPA T11-L5 mobilizations for decreased spasm and improved mobility, grade II/III  Neuromuscular Re-education: Seated elliptical, Seat 16, L4.0, x10 min for graded exposure to movement and nervous system downregulation Supine hooklying trunk rotations with QL bias lumbar mobility and decreased pain; x10 ea LE Supine  double knee to chest with diaphragmatic breath for improved lumbar mobility and decreased pain; x10, 1 sec hold Supine posterior pelvic tilts with coordinated breath for improved lumbar mobility and decreased pain; 2x10  *not today* Supine figure 4 stretch, BLE, for improved mobility and decreased pain Supine single knee to chest with diaphragmatic breath for improved lumbar mobility and decreased pain, BLE  Therapeutic Exercise: Standing heel raise with great toe extension on towel roll, for graded exposure to plantar arch load; 20x Seated swiss ball rollout; blue physioball; x5, 5 sec  Patient educated throughout session on appropriate technique and form using multi-modal cueing, HEP, and activity modification. Patient articulated understanding and returned demonstration. Discussed with patient role of pain as "alarm" for nervous system and strategies for nervous system downregulation/desensitization.   Patient Response to interventions: No notable low back or foot pain reported during cooldown on seated elliptical. Patient does have notable quadriceps fatigue.      PT Long Term Goals - 07/08/20 1157      PT LONG TERM GOAL #1   Title Patient will be independent with HEP in order to decrease ankle pain and increase strength in order to improve pain-free function at home and work.    Baseline IE: provided    Time 6    Period Weeks    Status New    Target Date 08/18/20      PT LONG TERM GOAL #2   Title Patient will demonstrate improved function as evidenced by a score of (to be determined at next visit) on FOTO measure for full participation in activities at home and in the community.    Baseline IE: collected at next visit    Time 6    Period Weeks    Status New    Target Date 08/18/20      PT LONG TERM GOAL #3   Title Patient will decrease worst pain as reported on NPRS by at least 2 points to demonstrate clinically significant reduction in pain in order to restore/improve  function and overall QOL.    Baseline IE: 8/10 B heels    Time 6    Period Weeks    Status New    Target Date 08/18/20      PT LONG TERM GOAL #4   Title Patient will report being able to return to activities including, but not limited to: standing and walking for > 2 hours with pain controlled and minimal limitation to indicate complete resolution of the chief complaint and return to prior level of participation at home and in the community.    Baseline IE: standing < 5 min, walking 30 min    Time 6    Period Weeks    Status New    Target Date 08/19/20      PT LONG TERM GOAL #5   Title Patient will demonstrate  understanding of basic self-management/down-regulation of the nervous system for persistent pain condition and stress as evidenced by diaphragmatic breathing without cueing, body scan/progressive relaxation meditation, and improved sleep hygiene in order to transition to independent management of patient's chief complaint:persistent pain in multiple regions of the body.    Baseline Ie: not demonstrated    Time 6    Period Weeks    Status New    Target Date 08/18/20                 Plan - 08/12/20 1314    Clinical Impression Statement Patient has minimal low back pain at this time, though he intermittently does have flare-ups of low back pain with prolonged standing. Patient has ongoing plantar foot pain. He responds well with manual interventions and is able to progress plantar fascia graded loading to standing versus sitting heel raise with great toe extended. Patient will benefit from further work on pain neuroscience education with graded activity for central nervous system downregulation. HEP was progressed today due to patient continuing with 1x/week with in-clinic visits. Patient will benefit from continued skilled PT intervention to address remaining deficits in pain, tolerance of weightbearing/loaded activity, and mobility as needed for best return to PLOF.    Personal  Factors and Comorbidities Age;Behavior Pattern;Comorbidity 3+;Past/Current Experience;Time since onset of injury/illness/exacerbation;Profession    Comorbidities anxiety, arthritis, depression, GERD, OSA    Examination-Activity Limitations Bed Mobility;Sleep;Sit;Squat;Stairs;Stand;Locomotion Level;Lift    Examination-Participation Restrictions Occupation;Cleaning;Yard Work;Community Activity;Shop    Stability/Clinical Decision Making Evolving/Moderate complexity    Rehab Potential Fair    PT Frequency 2x / week    PT Duration 6 weeks    PT Treatment/Interventions Taping;Dry needling;Joint Manipulations;Spinal Manipulations;Manual techniques;Patient/family education;Neuromuscular re-education;Therapeutic exercise;Moist Heat;Cryotherapy;Electrical Stimulation    PT Next Visit Plan manual as needed, trunk stabilization, BLE stretching, graded loading    PT Home Exercise Plan gentle mobilization of plantar surface, seated soleus stretch, hooklying trunk rotations    Consulted and Agree with Plan of Care Patient           Patient will benefit from skilled therapeutic intervention in order to improve the following deficits and impairments:  Pain,Improper body mechanics,Difficulty walking,Hypomobility,Decreased activity tolerance  Visit Diagnosis: Bilateral foot pain  Low back pain, unspecified back pain laterality, unspecified chronicity, unspecified whether sciatica present  Difficulty in walking, not elsewhere classified     Problem List Patient Active Problem List   Diagnosis Date Noted  . Plantar fasciitis, right 07/14/2020  . Cervical spondylosis with radiculopathy 06/01/2020  . Supraspinatus syndrome, 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  . Lumbar spondylosis 06/01/2020  . Plantar fasciitis, 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  . Chest pain of uncertain etiology 50/53/9767  . OSA (obstructive sleep apnea) 06/04/2019  . Current smoker 06/04/2019  . Healthcare maintenance 06/04/2019  . Acute pericarditis 04/03/2019   . Sarcoidosis 10/16/2018  . Lung nodules   . Adenopathy   . Essential hypertension 11/28/2017   Valentina Gu, PT, DPT [id number] Eilleen Kempf 08/12/2020, 1:27 PM  [place] [facility] [address] [place], [place], [address] Phone: [phone]   Fax:  [phone]  Name: Legacy Lacivita MRN: [id number] Date of Birth: 1980-11-03

## 2020-08-12 ENCOUNTER — Encounter: Payer: Self-pay | Admitting: Physical Therapy

## 2020-08-13 ENCOUNTER — Encounter: Payer: Medicaid Other | Admitting: Physical Therapy

## 2020-08-17 ENCOUNTER — Other Ambulatory Visit: Payer: Self-pay | Admitting: Pulmonary Disease

## 2020-08-18 ENCOUNTER — Ambulatory Visit (INDEPENDENT_AMBULATORY_CARE_PROVIDER_SITE_OTHER): Payer: Medicaid Other | Admitting: Family Medicine

## 2020-08-18 ENCOUNTER — Encounter: Payer: Self-pay | Admitting: Physical Therapy

## 2020-08-18 ENCOUNTER — Ambulatory Visit: Payer: Medicaid Other | Admitting: Physical Therapy

## 2020-08-18 ENCOUNTER — Encounter: Payer: Self-pay | Admitting: Family Medicine

## 2020-08-18 ENCOUNTER — Ambulatory Visit: Payer: Medicaid Other | Admitting: Family Medicine

## 2020-08-18 ENCOUNTER — Other Ambulatory Visit: Payer: Self-pay

## 2020-08-18 VITALS — BP 128/86 | HR 92 | Ht 73.0 in | Wt 255.0 lb

## 2020-08-18 DIAGNOSIS — M79671 Pain in right foot: Secondary | ICD-10-CM | POA: Diagnosis not present

## 2020-08-18 DIAGNOSIS — K219 Gastro-esophageal reflux disease without esophagitis: Secondary | ICD-10-CM

## 2020-08-18 DIAGNOSIS — R262 Difficulty in walking, not elsewhere classified: Secondary | ICD-10-CM

## 2020-08-18 DIAGNOSIS — M79672 Pain in left foot: Secondary | ICD-10-CM | POA: Diagnosis not present

## 2020-08-18 DIAGNOSIS — I10 Essential (primary) hypertension: Secondary | ICD-10-CM | POA: Diagnosis not present

## 2020-08-18 DIAGNOSIS — M545 Low back pain, unspecified: Secondary | ICD-10-CM

## 2020-08-18 MED ORDER — TRIAMTERENE-HCTZ 75-50 MG PO TABS: 1.0000 | ORAL_TABLET | Freq: Every day | ORAL | 1 refills | Status: DC

## 2020-08-18 MED ORDER — PANTOPRAZOLE SODIUM 20 MG PO TBEC: 20.0000 mg | DELAYED_RELEASE_TABLET | Freq: Every day | ORAL | 1 refills | Status: DC

## 2020-08-18 MED ORDER — AMLODIPINE BESYLATE 10 MG PO TABS: 10.0000 mg | ORAL_TABLET | ORAL | 1 refills | Status: DC

## 2020-08-18 NOTE — Progress Notes (Signed)
Date:  08/18/2020   Name:  Luis Mcknight   DOB:  [birth year]   MRN:  [id number]   Chief Complaint: Follow-up (3 months follow up)  Hypertension This is a chronic problem. The current episode started more than 1 year ago. The problem has been gradually improving since onset. The problem is controlled. Pertinent negatives include no anxiety, blurred vision, chest pain, headaches, malaise/fatigue, neck pain, orthopnea, palpitations, peripheral edema, PND, shortness of breath or sweats. Past treatments include diuretics and calcium channel blockers. The current treatment provides mild improvement. There are no compliance problems.  There is no history of angina, kidney disease, CAD/MI, CVA, heart failure, left ventricular hypertrophy, PVD or retinopathy. There is no history of chronic renal disease, a hypertension causing med or renovascular disease.   Lab Results  Component Value Date   CREATININE 0.98 04/03/2019   BUN 16 04/03/2019   NA 141 04/03/2019   K 4.4 04/03/2019   CL 103 04/03/2019   CO2 23 04/03/2019   No results found for: CHOL, HDL, LDLCALC, LDLDIRECT, TRIG, CHOLHDL No results found for: TSH No results found for: HGBA1C Lab Results  Component Value Date   WBC 7.1 01/01/2019   HGB 14.7 01/01/2019   HCT 45.7 01/01/2019   MCV 80.3 01/01/2019   PLT 237 01/01/2019   Lab Results  Component Value Date   ALT 52 (H) 01/01/2019   AST 23 01/01/2019   ALKPHOS 101 01/01/2019   BILITOT 0.6 01/01/2019     Review of Systems  Constitutional:  Negative for chills, fever and malaise/fatigue.  HENT:  Negative for drooling, ear discharge, ear pain and sore throat.   Eyes:  Negative for blurred vision.  Respiratory:  Negative for cough, shortness of breath and wheezing.   Cardiovascular:  Negative for chest pain, palpitations, orthopnea, leg swelling and PND.  Gastrointestinal:  Negative for abdominal pain, blood in stool, constipation, diarrhea and nausea.  Endocrine: Negative  for polydipsia.  Genitourinary:  Negative for dysuria, frequency, hematuria and urgency.  Musculoskeletal:  Negative for back pain, myalgias and neck pain.  Skin:  Negative for rash.  Allergic/Immunologic: Negative for environmental allergies.  Neurological:  Negative for dizziness and headaches.  Hematological:  Does not bruise/bleed easily.  Psychiatric/Behavioral:  Negative for suicidal ideas. The patient is not nervous/anxious.    Patient Active Problem List   Diagnosis Date Noted   Plantar fasciitis, right 07/14/2020   Cervical spondylosis with radiculopathy 06/01/2020   Supraspinatus syndrome, 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   Lumbar spondylosis 06/01/2020   Plantar fasciitis, 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   Chest pain of uncertain etiology 09/62/8366   OSA (obstructive sleep apnea) 06/04/2019   Current smoker 06/04/2019   Healthcare maintenance 06/04/2019   Acute pericarditis 04/03/2019   Sarcoidosis 10/16/2018   Lung nodules    Adenopathy    Essential hypertension 11/28/2017    Allergies  Allergen Reactions   Latex Itching    GLOVES    Past Surgical History:  Procedure Laterality Date   ELECTROMAGNETIC NAVIGATION BROCHOSCOPY Left 10/08/2018   Procedure: ELECTROMAGNETIC NAVIGATION BRONCHOSCOPY, LEFT;  Surgeon: Tyler Pita, MD;  Location: [facility] ORS;  Service: Cardiopulmonary;  Laterality: Left;   ENDOBRONCHIAL ULTRASOUND Left 10/08/2018   Procedure: ENDOBRONCHIAL ULTRASOUND, LEFT;  Surgeon: Tyler Pita, MD;  Location: [facility] ORS;  Service: Cardiopulmonary;  Laterality: Left;   NO PAST SURGERIES      Social History   Tobacco Use   Smoking status: Some Days    Packs/day: 0.25    Years:  20.00    Pack years: 5.00    Types: Cigarettes    Start date: 03/07/1998    Last attempt to quit: 08/06/2019    Years since quitting: 1.0   Smokeless tobacco: Never   Tobacco comments:    down to 6 cigarettes a day   Vaping Use   Vaping Use: Former   Start date: 04/18/2012   Quit date: 04/18/2016   Substance Use Topics   Alcohol use: Not Currently   Drug use: Not Currently     Medication list has been reviewed and updated.  Current Meds  Medication Sig   acetaminophen (TYLENOL) 500 MG tablet Take 500-1,000 mg by mouth every 6 (six) hours as needed.   amLODipine (NORVASC) 10 MG tablet Take 10 mg by mouth every morning. Cardiology   pantoprazole (PROTONIX) 20 MG tablet Take 1 tablet (20 mg total) by mouth daily.   predniSONE (DELTASONE) 10 MG tablet TAKE 1 TABLET(10 MG) BY MOUTH DAILY WITH BREAKFAST (Patient taking differently: Patsey Berthold)   triamterene-hydrochlorothiazide (MAXZIDE) 75-50 MG tablet TAKE 1 TABLET BY MOUTH DAILY   UNABLE TO FIND CPAP AT NIGHT   [DISCONTINUED] diclofenac (VOLTAREN) 50 MG EC tablet Take 1 tablet (50 mg total) by mouth 2 (two) times daily as needed for mild pain or moderate pain.    PHQ 2/9 Scores 07/23/2020 07/14/2020 06/01/2020 05/18/2020  PHQ - 2 Score 6 5 6 6   PHQ- 9 Score 27 21 18 18     GAD 7 : Generalized Anxiety Score 07/14/2020 06/01/2020 05/18/2020  Nervous, Anxious, on Edge 2 2 2   Control/stop worrying 2 2 2   Worry too much - different things 2 2 2   Trouble relaxing 2 3 3   Restless 2 3 3   Easily annoyed or irritable 3 2 2   Afraid - awful might happen 3 1 1   Total GAD 7 Score 16 15 15   Anxiety Difficulty Very difficult Very difficult Somewhat difficult    BP Readings from Last 3 Encounters:  08/18/20 (!) 128/92  07/14/20 110/62  06/01/20 114/76    Physical Exam Vitals and nursing note reviewed.  HENT:     Head: Normocephalic.     Right Ear: External ear normal.     Left Ear: External ear normal.     Nose: Nose normal.  Eyes:     General: No scleral icterus.       Right eye: No discharge.        Left eye: No discharge.     Conjunctiva/sclera: Conjunctivae normal.     Pupils: Pupils are equal, round, and reactive to light.  Neck:     Thyroid: No thyromegaly.     Vascular: No JVD.     Trachea: No tracheal deviation.   Cardiovascular:     Rate and Rhythm: Normal rate and regular rhythm.     Pulses: Normal pulses.     Heart sounds: Normal heart sounds. No murmur heard.   No friction rub. No gallop. No S3 or S4 sounds.  Pulmonary:     Effort: No respiratory distress.     Breath sounds: Normal breath sounds. No wheezing or rales.  Abdominal:     General: Bowel sounds are normal.     Palpations: Abdomen is soft. There is no mass.     Tenderness: There is no abdominal tenderness. There is no guarding or rebound.  Musculoskeletal:        General: No tenderness. Normal range of motion.     Cervical back: Normal range  of motion and neck supple.  Lymphadenopathy:     Cervical: No cervical adenopathy.  Skin:    General: Skin is warm.     Findings: No rash.  Neurological:     Mental Status: He is alert and oriented to person, place, and time.     Cranial Nerves: No cranial nerve deficit.     Deep Tendon Reflexes: Reflexes are normal and symmetric.    Wt Readings from Last 3 Encounters:  08/18/20 255 lb (115.7 kg)  07/14/20 250 lb 6.4 oz (113.6 kg)  06/01/20 248 lb 12.8 oz (112.9 kg)    BP (!) 128/92   Pulse 92   Ht 6\' 1"  (1.854 m)   Wt 255 lb (115.7 kg)   BMI 33.64 kg/m   Assessment and Plan:  1. Essential hypertension Chronic.  Controlled.  Stable.  Blood pressure is good at 128/86.  We will continue with triamterene 75-50 once a day as well as amlodipine 10 mg once a day.  Will check renal function panel for electrolytes and GFR particularly since patient has been having some muscle cramps. - triamterene-hydrochlorothiazide (MAXZIDE) 75-50 MG tablet; Take 1 tablet by mouth daily.  Dispense: 90 tablet; Refill: 1 - amLODipine (NORVASC) 10 MG tablet; Take 1 tablet (10 mg total) by mouth every morning. Cardiology  Dispense: 90 tablet; Refill: 1 - Renal Function Panel  2. Gastroesophageal reflux disease without esophagitis Chronic.  Controlled.  Stable.  Continue pantoprazole 20 mg once a  day. - pantoprazole (PROTONIX) 20 MG tablet; Take 1 tablet (20 mg total) by mouth daily.  Dispense: 90 tablet; Refill: 1   Discussed patient that he needs to see a psychiatrist and he has been unable to make that arrangement.  Is difficult for me to refer since I have not been changed on his Medicaid card.  I have given him a list of individuals and companies that he can see in the area on a walk-in basis and patient will attempt to pursue this in the meantime.

## 2020-08-18 NOTE — Therapy (Signed)
[facility] [address]. [place], [place], [address] Phone: [phone]   Fax:  [phone]  Physical Therapy Treatment Physical Therapy Progress Note/Recertification Note   Dates of reporting period 07/07/20   to   08/18/20  Patient Details  Name: Luis Mcknight MRN: [id number] Date of Birth: September 16, 1980 Referring Provider (PT): Rosette Reveal   Encounter Date: 08/18/2020   PT End of Session - 08/19/20 1504     Visit Number 5    Number of Visits 12    Date for PT Re-Evaluation 09/16/20    Authorization Type IE 07/07/2020    Authorization - Visit Number 5    Authorization - Number of Visits 10    Progress Note Due on Visit 10    PT Start Time 1033    PT Stop Time 1120    PT Time Calculation (min) 47 min    Activity Tolerance Patient tolerated treatment well    Behavior During Therapy [facility] for tasks assessed/performed             Past Medical History:  Diagnosis Date   Anxiety    Arthritis    Depression    NO MEDS   GERD (gastroesophageal reflux disease)    NO MEDS   Hypertension    OSA on CPAP    Pneumonia    JUNE 2020   Sleep apnea     Past Surgical History:  Procedure Laterality Date   ELECTROMAGNETIC NAVIGATION BROCHOSCOPY Left 10/08/2018   Procedure: ELECTROMAGNETIC NAVIGATION BRONCHOSCOPY, LEFT;  Surgeon: Tyler Pita, MD;  Location: [facility] ORS;  Service: Cardiopulmonary;  Laterality: Left;   ENDOBRONCHIAL ULTRASOUND Left 10/08/2018   Procedure: ENDOBRONCHIAL ULTRASOUND, LEFT;  Surgeon: Tyler Pita, MD;  Location: [facility] ORS;  Service: Cardiopulmonary;  Laterality: Left;   NO PAST SURGERIES      There were no vitals filed for this visit.   Subjective Assessment - 08/18/20 1037     Subjective Patient denies back pain at arrival. He reports mainly having pain along bilateral feet. Patient reports compliance with home exercise. Patient reports bilateral foot pain with pain along plantar heel region  and along arch of his foot. Patient reports pain up to 8/10 with showering the previous evening in his feet. He reports mild pain after his last session. He reports pain along metatarsal head region that wasn't as apparent last week. He reports no specific aggravating activity; he is unsure if driving notably affected his symptoms. Patient reports his back has been doing well - "it is not always bothering me." He reports comorbid L shoulder pain. About 33-40% SANE score at this time. Patient reports going over some of his pain science educational materials at home.    Pertinent History Patient has had both issues for > 1 year. 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 Patient worked 12 hour shifts and was on his feet throughout that time which was when his B heel pain was worsened. Patient notes back pain radiates around B flanks. Patient sleeps on his stomach which is his only comfortable position as laying on his side exacerbates his neck and L shoulder/UE pain.    Limitations Lifting;Standing;House hold activities;Walking    How long can you stand comfortably? 15 minutes    How long can you walk comfortably? "doesn't bother me as much" in regard to foot pain    Pain Score 6     Pain Location  Foot    Pain Orientation Left;Right    Pain Onset More than a month ago             POSTURE/OBSERVATIONS:  Lumbar lordosis: mildly increased Thoracic kyphosis: WNL Iliac crest height: equal bilaterally Lumbar lateral shift: negative Pelvic obliquity: negative Leg length discrepancy: negative   Posture guarded overall with increased anterior chain shortening in sitting, FHRS   GAIT: Grossly WFL, patient ambulates with increased forefoot contact, heel to toe progression along lateral forefoot   RANGE OF MOTION:      LEFT RIGHT  Lumbar forward flexion (65):  WNL*      Lumbar extension (30): WNL *      Lumbar lateral flexion (25):   WNL* WNL*   Thoracic and Lumbar rotation (30):     WNL WNL  Hip Flexion (125):    WNL * WNL*  Hip IR (45):   WNL* WNL*  Hip ER (45):   WNL* WNL*  Hip Abduction (40):   WNL WNL  Hip extension (15):   WNL WNL  Knee flexion (135):   WNL WNL  Knee extension (0):   WNL WNL  Plantarflexion (20-50):   WNL WNL  Dorsiflexion (0-20):   8 8  Ankle inversion (30):   WNL WNL  Ankle eversion (15):   WNL WNL  Great toe extension  40 48        (L abdominal pain with L-spine flexion, mild pain with extension, pain along ipsilateral ribcage with sidebending)    SENSATION: Grossly intact to light touch bilateral LEs as determined by testing dermatomes L2-S2 Proprioception and hot/cold testing deferred on this date   PALPATION: TTP over bilateral medial longitudinal arch with most pain reported just proximally to 1st-3rd MTPs, bilateral erector spinae and QL      TREATMENT   Manual Therapy: DTM/IASTM performed to B calves, B flexor hallucis brevis and quadratus plantae, and lumbar paraspinal mm L3-L5 to allow for decreased tension and pain/sensitivity and improved posture and function   *not today* CPA/UPA T11-L5 mobilizations for decreased spasm and improved mobility, grade II/III   Neuromuscular Re-education: Seated elliptical, Seat 16, L4.0, x10 min for graded exposure to movement and nervous system downregulation  Supine hooklying trunk rotations with QL bias lumbar mobility and decreased pain; x10 ea LE    *not today* Supine figure 4 stretch, BLE, for improved mobility and decreased pain Supine single knee to chest with diaphragmatic breath for improved lumbar mobility and decreased pain, BLE Supine double knee to chest with diaphragmatic breath for improved lumbar mobility and decreased pain; x10, 1 sec hold Supine posterior pelvic tilts with coordinated breath for improved lumbar mobility and decreased pain; 2x10   Therapeutic Exercise: Standing heel raise with great toe extension on towel  roll, for graded exposure to plantar arch load; 20x  *not today* Seated swiss ball rollout; blue physioball; x5, 5 sec   Patient educated throughout session on appropriate technique and form using multi-modal cueing, HEP, and activity modification. Patient articulated understanding and returned demonstration.   Patient Response to interventions: Minimal back pain with only fleeting pain along flank and ipsilateral rib cage with sidebending and rotation; mild bilateral foot pain remaining at end of session   ASSESSMENT Patient has partially met 3/5 established PT goals. He has grossly WFL ROM for all planes of motion for lumbar spine, though he has vague reports of pain along lateral abdominal wall and ipsilateral ribcage with sidebending. Pateint demonstrates moderate dorsiflexion and great toe  extension mobility deficits. He does still present with diffuse pain pattern with various complaints consistent with cenral sensitization noted in previous assessment. Patient has modestly improved standing duration tolerated and reports tolerated walking well (pt tolerates activity better when on the move versus static standing). Patient has been receptive to pain neuroscience education concepts and home program utilized to date. Patient unfortunately missed several appointments after his first week in PT and has had limited intervention to date. Patient presents with remaining deficits in pain, tolerance of ROM, standing activity tolerance, and ankle/foot mobility deficits. Patient also has various complaints (knee pain, upper quarter pain) that may be addressed once patient's current primary pain complaint is under control. Patient will benefit from continued skilled therapeutic intervention to address the above impairments and activity limitations for best return to PLOF and for improved QoL.    PT Long Term Goals - 08/18/20 1529       PT LONG TERM GOAL #1   Title Patient will be independent with HEP in  order to decrease ankle pain and increase strength in order to improve pain-free function at home and work.    Baseline Patient is usually compliant with HEP per report, but does require moderate cueing for recollection of prescribed home exercises    Time 6    Period Weeks    Status Partially Met    Target Date 09/01/20      PT LONG TERM GOAL #2   Title Patient will demonstrate improved function as evidenced by a score of 48 on FOTO measure for full participation in activities at home and in the community.    Baseline IE: 41, no change noted at this time    Time 6    Period Weeks    Status Not Met    Target Date 09/15/20      PT LONG TERM GOAL #3   Title Patient will decrease worst pain as reported on NPRS by at least 2 points to demonstrate clinically significant reduction in pain in order to restore/improve function and overall QOL.    Baseline IE: 8/10 B heels. 08/18/20: up to 8/10 plantar foot pain bilat    Time 6    Period Weeks    Status Not Met    Target Date 09/15/20      PT LONG TERM GOAL #4   Title Patient will report being able to return to activities including, but not limited to: standing and walking for > 2 hours with pain controlled and minimal limitation to indicate complete resolution of the chief complaint and return to prior level of participation at home and in the community.    Baseline IE: standing < 5 min, walking 30 min. 08/18/20: standing 15 minutes, walking without notable duration limitation    Time 6    Period Weeks    Status Partially Met    Target Date 09/15/20      PT LONG TERM GOAL #5   Title Patient will demonstrate understanding of basic self-management/down-regulation of the nervous system for persistent pain condition and stress as evidenced by diaphragmatic breathing without cueing, body scan/progressive relaxation meditation, and improved sleep hygiene in order to transition to independent management of patient's chief complaint:persistent pain in  multiple regions of the body.    Baseline Ie: not demonstrated; 08/18/20: partial understanding of pain science concepts    Time 6    Period Weeks    Status Partially Met    Target Date 09/15/20  Patient will benefit from skilled therapeutic intervention in order to improve the following deficits and impairments:  Pain, Improper body mechanics, Difficulty walking, Hypomobility, Decreased activity tolerance  Visit Diagnosis: Bilateral foot pain  Low back pain, unspecified back pain laterality, unspecified chronicity, unspecified whether sciatica present  Difficulty in walking, not elsewhere classified     Problem List Patient Active Problem List   Diagnosis Date Noted   Plantar fasciitis, right 07/14/2020   Cervical spondylosis with radiculopathy 06/01/2020   Supraspinatus syndrome, 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 [place], [place], [address] Phone: [phone]   Fax:  [phone]  Name: Luis Mcknight MRN: [id number] Date of Birth: 11-21-1980

## 2020-08-19 LAB — RENAL FUNCTION PANEL
Albumin: 4.9 g/dL (ref 4.0–5.0)
BUN/Creatinine Ratio: 17 (ref 9–20)
BUN: 20 mg/dL (ref 6–20)
CO2: 22 mmol/L (ref 20–29)
Calcium: 9.6 mg/dL (ref 8.7–10.2)
Chloride: 97 mmol/L (ref 96–106)
Creatinine, Ser: 1.19 mg/dL (ref 0.76–1.27)
Glucose: 93 mg/dL (ref 65–99)
Phosphorus: 3 mg/dL (ref 2.8–4.1)
Potassium: 4 mmol/L (ref 3.5–5.2)
Sodium: 136 mmol/L (ref 134–144)
eGFR: 81 mL/min/{1.73_m2} (ref >59)

## 2020-08-19 NOTE — Addendum Note (Signed)
Addended by: Valentina Gu T on: 08/19/2020 03:44 PM   Modules accepted: Orders

## 2020-08-20 ENCOUNTER — Encounter: Payer: Medicaid Other | Admitting: Physical Therapy

## 2020-08-25 ENCOUNTER — Encounter: Payer: Medicaid Other | Admitting: Physical Therapy

## 2020-08-25 ENCOUNTER — Ambulatory Visit: Payer: Medicaid Other | Admitting: Family Medicine

## 2020-08-26 ENCOUNTER — Ambulatory Visit: Payer: Medicaid Other | Admitting: Physical Therapy

## 2020-08-27 ENCOUNTER — Encounter: Payer: Medicaid Other | Admitting: Physical Therapy

## 2020-08-28 ENCOUNTER — Other Ambulatory Visit: Payer: Self-pay

## 2020-08-28 ENCOUNTER — Encounter: Payer: Self-pay | Admitting: Family Medicine

## 2020-08-28 ENCOUNTER — Ambulatory Visit (INDEPENDENT_AMBULATORY_CARE_PROVIDER_SITE_OTHER): Payer: Medicaid Other | Admitting: Family Medicine

## 2020-08-28 VITALS — BP 122/80 | HR 86 | Temp 98.5°F | Ht 73.0 in | Wt 246.0 lb

## 2020-08-28 DIAGNOSIS — M797 Fibromyalgia: Secondary | ICD-10-CM | POA: Diagnosis not present

## 2020-08-28 DIAGNOSIS — M79672 Pain in left foot: Secondary | ICD-10-CM

## 2020-08-28 DIAGNOSIS — M7741 Metatarsalgia, right foot: Secondary | ICD-10-CM | POA: Diagnosis not present

## 2020-08-28 DIAGNOSIS — M79671 Pain in right foot: Secondary | ICD-10-CM

## 2020-08-28 DIAGNOSIS — F4323 Adjustment disorder with mixed anxiety and depressed mood: Secondary | ICD-10-CM

## 2020-08-28 DIAGNOSIS — M722 Plantar fascial fibromatosis: Secondary | ICD-10-CM

## 2020-08-28 MED ORDER — PREGABALIN 75 MG PO CAPS: 75.0000 mg | ORAL_CAPSULE | Freq: Two times a day (BID) | ORAL | 3 refills | Status: DC

## 2020-08-28 NOTE — Assessment & Plan Note (Signed)
Given the chronicity of his symptoms with persistent exacerbation despite escalating degrees of conservative treatment, most recently including ultrasound-guided plantar fascia injections, we will proceed with advanced imaging with MRI bilateral foot.  His physical exam shows tenderness that appears to be mildly improved currently stable at the calcaneus bilaterally, his right foot has additional findings of metatarsal tenderness at the first and second metatarsal heads.  The remainder of his examination is benign.  Given the newly noted history of frostbite, concern for neuropathic involvement is considered.  I will write for Lyrica.  Additionally, given his persistently elevated mental health scores, a referral to psychiatry has been placed, they can additionally evaluate for any elements of fibromyalgia.  Pending MRI results and response to Lyrica, we can discuss additional treatment strategies.

## 2020-08-28 NOTE — Patient Instructions (Signed)
-   Obtain MRI of both feet once scheduled - Follow-up with CBC once scheduled - Start Lyrica twice daily - Continue with physical therapy, home exercises - Return for follow-up in 4 weeks - Contact us for any questions between now and then

## 2020-08-28 NOTE — Assessment & Plan Note (Addendum)
Newly noted right metatarsal pain, physical exam reveals tenderness at the first and second metatarsal heads.  Given his comorbid persistent plantar fasciitis, plan for advanced imaging decided.  We will follow the results and response to newly initiated Lyrica.

## 2020-08-28 NOTE — Assessment & Plan Note (Signed)
Clinical index of suspicion high for fibromyalgia given his comorbid mood disorder and constellation of multiple sites of recalcitrant musculoskeletal pain.  Referral to psychiatry was placed in this regard.  Over the interim Lyrica was prescribed.

## 2020-08-28 NOTE — Progress Notes (Signed)
Primary Care / Sports Medicine Office Visit  Patient Information:  Patient ID: Luis Mcknight, male DOB: 04-10-1980 Age: 40 y.o. MRN: [id number]   Luis Mcknight is a pleasant 40 y.o. male presenting with the following:  Chief Complaint  Patient presents with   Follow-up   Cervical spondylosis with radiculopathy    Left-sided; 2/10 pain   Plantar Fasciitis    Bilateral; following with PT, not helping per patient; injections ineffective per patient; 7/10 pain    Supraspinatus syndrome, left    Not taking diclofenac 50 mg as recommended; noted stiff and achy in the morning; 4/10 pain   Lumbar spondylosis    following with PT, with relief; 1/10 pain    Review of Systems pertinent details above   Patient Active Problem List   Diagnosis Date Noted   Metatarsalgia, right foot 08/28/2020   Adjustment reaction with anxiety and depression 08/28/2020   Fibromyalgia 08/28/2020   Plantar fasciitis, right 07/14/2020   Cervical spondylosis with radiculopathy 06/01/2020   Supraspinatus syndrome, 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   Lumbar spondylosis 06/01/2020   Plantar fasciitis, 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   Chest pain of uncertain etiology 35/32/9924   OSA (obstructive sleep apnea) 06/04/2019   Current smoker 06/04/2019   Healthcare maintenance 06/04/2019   Acute pericarditis 04/03/2019   Sarcoidosis 10/16/2018   Lung nodules    Adenopathy    Essential hypertension 11/28/2017   Past Medical History:  Diagnosis Date   Anxiety    Arthritis    Depression    NO MEDS   GERD (gastroesophageal reflux disease)    NO MEDS   Hypertension    OSA on CPAP    Pneumonia    JUNE 2020   Sleep apnea    Outpatient Encounter Medications as of 08/28/2020  Medication Sig   acetaminophen (TYLENOL) 500 MG tablet Take 500-1,000 mg by mouth every 6 (six) hours as needed.   amLODipine (NORVASC) 10 MG tablet Take 1 tablet (10 mg total) by mouth every morning. Cardiology   pantoprazole (PROTONIX) 20 MG tablet Take 1  tablet (20 mg total) by mouth daily.   predniSONE (DELTASONE) 10 MG tablet TAKE 1 TABLET(10 MG) BY MOUTH DAILY WITH BREAKFAST (Patient taking differently: Patsey Berthold)   pregabalin (LYRICA) 75 MG capsule Take 1 capsule (75 mg total) by mouth 2 (two) times daily.   triamterene-hydrochlorothiazide (MAXZIDE) 75-50 MG tablet Take 1 tablet by mouth daily.   UNABLE TO FIND CPAP AT NIGHT (Patient not taking: Reported on 08/28/2020)   No facility-administered encounter medications on file as of 08/28/2020.   Past Surgical History:  Procedure Laterality Date   ELECTROMAGNETIC NAVIGATION BROCHOSCOPY Left 10/08/2018   Procedure: ELECTROMAGNETIC NAVIGATION BRONCHOSCOPY, LEFT;  Surgeon: Tyler Pita, MD;  Location: [facility] ORS;  Service: Cardiopulmonary;  Laterality: Left;   ENDOBRONCHIAL ULTRASOUND Left 10/08/2018   Procedure: ENDOBRONCHIAL ULTRASOUND, LEFT;  Surgeon: Tyler Pita, MD;  Location: [facility] ORS;  Service: Cardiopulmonary;  Laterality: Left;   NO PAST SURGERIES      Vitals:   08/28/20 1120  BP: 122/80  Pulse: 86  Temp: 98.5 F (36.9 C)  SpO2: 96%   Vitals:   08/28/20 1120  Weight: 246 lb (111.6 kg)  Height: 6\' 1"  (1.854 m)   Body mass index is 32.46 kg/m.  No results found.   Independent interpretation of notes and tests performed by another provider:   None  Procedures performed:   None  Pertinent History, Exam, Impression, and Recommendations:   Plantar fasciitis, left Given  the chronicity of his symptoms with persistent exacerbation despite escalating degrees of conservative treatment, most recently including ultrasound-guided plantar fascia injections, we will proceed with advanced imaging with MRI bilateral foot.  His physical exam shows tenderness that appears to be mildly improved currently stable at the calcaneus bilaterally, his right foot has additional findings of metatarsal tenderness at the first and second metatarsal heads.  The remainder of his examination  is benign.  Given the newly noted history of frostbite, concern for neuropathic involvement is considered.  I will write for Lyrica.  Additionally, given his persistently elevated mental health scores, a referral to psychiatry has been placed, they can additionally evaluate for any elements of fibromyalgia.  Pending MRI results and response to Lyrica, we can discuss additional treatment strategies.  Plantar fasciitis, right Given the chronicity of his symptoms with persistent exacerbation despite escalating degrees of conservative treatment, most recently including ultrasound-guided plantar fascia injections, we will proceed with advanced imaging with MRI bilateral foot.  His physical exam shows tenderness that appears to be mildly improved currently stable at the calcaneus bilaterally, his right foot has additional findings of metatarsal tenderness at the first and second metatarsal heads.  The remainder of his examination is benign.  Given the newly noted history of frostbite, concern for neuropathic involvement is considered.  I will write for Lyrica.  Additionally, given his persistently elevated mental health scores, a referral to psychiatry has been placed, they can additionally evaluate for any elements of fibromyalgia.  Pending MRI results and response to Lyrica, we can discuss additional treatment strategies.  Metatarsalgia, right foot Newly noted right metatarsal pain, physical exam reveals tenderness at the first and second metatarsal heads.  Given his comorbid persistent plantar fasciitis, plan for advanced imaging decided.  We will follow the results and response to newly initiated Lyrica.  Fibromyalgia Clinical index of suspicion high for fibromyalgia given his comorbid mood disorder and constellation of multiple sites of recalcitrant musculoskeletal pain.  Referral to psychiatry was placed in this regard.  Over the interim Lyrica was prescribed.     Orders & Medications Meds ordered  this encounter  Medications   pregabalin (LYRICA) 75 MG capsule    Sig: Take 1 capsule (75 mg total) by mouth 2 (two) times daily.    Dispense:  60 capsule    Refill:  3   Orders Placed This Encounter  Procedures   MR FOOT LEFT WO CONTRAST   MR FOOT RIGHT WO CONTRAST   Ambulatory referral to Psychiatry     No follow-ups on file.     Montel Culver, MD   Primary Care Sports Medicine [place]

## 2020-08-31 NOTE — Progress Notes (Signed)
Follow-up Outpatient Visit Date: 09/02/2020  Primary Care Provider: Juline Patch, MD [address] [place] [address]  Chief Complaint: Left arm pain  HPI:  Mr. Iyengar is a 40 y.o. male with history of sarcoidosis, pericarditis, hypertension, arthritis, and GERD, who presents for follow-up of pericarditis.  He was last seen via virtual visit by Laurann Montana, NP, in March.  At that time, he was feeling relatively well, having recovered from COVID-19 infection.  Home blood pressures had been running high for about 3 weeks.  He also complained of aching in his left arm.  He did not feel like losartan was controlling his blood pressure.  He was therefore switched to isosorbide mononitrate 60 mg daily.  Standing amlodipine was continued.  Today, Mr. Tetreault notes multiple orthopedic and neurologic issues including heel spurs in both feet, plantar fasciitis, and pain in his left arm attributed to a pinched nerve.  He is working with physical therapy for these issues as well as using acetaminophen to help.  He was also recently started on the Lyrica.  From a heart standpoint, Mr. Olivo is doing fairly well.  He notes rare episodes of a sharp pain under the left breast that can occur when eating or bending in certain ways.  It usually only last for a few seconds.  He does not have any exertional chest pain or shortness of breath.  His home blood pressures have typically been 140-150/100-110.  He is compliant with his medications and is trying to limit his salt intake though he reports some indiscretion.  He has been intolerant of losartan and isosorbide mononitrate in the past and does not wish to be rechallenged with these agents.  He is also not using CPAP at this time as his mask caused jaw discomfort.  --------------------------------------------------------------------------------------------------  Past Medical History:  Diagnosis Date   Anxiety    Arthritis    Depression     NO MEDS   GERD (gastroesophageal reflux disease)    NO MEDS   Hypertension    OSA on CPAP    Pneumonia    JUNE 2020   Sleep apnea    Past Surgical History:  Procedure Laterality Date   ELECTROMAGNETIC NAVIGATION BROCHOSCOPY Left 10/08/2018   Procedure: ELECTROMAGNETIC NAVIGATION BRONCHOSCOPY, LEFT;  Surgeon: Tyler Pita, MD;  Location: [facility] ORS;  Service: Cardiopulmonary;  Laterality: Left;   ENDOBRONCHIAL ULTRASOUND Left 10/08/2018   Procedure: ENDOBRONCHIAL ULTRASOUND, LEFT;  Surgeon: Tyler Pita, MD;  Location: [facility] OR[facility];  Service: Cardiopulmonary;  Laterality: Left;   NO PAST SURGERIES      Current Meds  Medication Sig   acetaminophen (TYLENOL) 500 MG tablet Take 500-1,000 mg by mouth every 6 (six) hours as needed.   amLODipine (NORVASC) 10 MG tablet Take 1 tablet (10 mg total) by mouth every morning. Cardiology   pantoprazole (PROTONIX) 20 MG tablet Take 1 tablet (20 mg total) by mouth daily.   predniSONE (DELTASONE) 10 MG tablet TAKE 1 TABLET(10 MG) BY MOUTH DAILY WITH BREAKFAST   pregabalin (LYRICA) 75 MG capsule Take 1 capsule (75 mg total) by mouth 2 (two) times daily.   triamterene-hydrochlorothiazide (MAXZIDE) 75-50 MG tablet Take 1 tablet by mouth daily.   UNABLE TO FIND CPAP AT NIGHT    Allergies: Latex  Social History   Tobacco Use   Smoking status: Some Days    Packs/day: 0.25    Years: 20.00    Pack years: 5.00    Types: Cigarettes  Start date: 03/07/1998    Last attempt to quit: 08/06/2019    Years since quitting: 1.0   Smokeless tobacco: Never  Vaping Use   Vaping Use: Former   Start date: 04/18/2012   Quit date: 04/18/2016  Substance Use Topics   Alcohol use: Not Currently   Drug use: Not Currently    Family History  Problem Relation Age of Onset   Hypertension Mother    Diabetes Mother    Diabetes Father    Hypertension Father    Heart attack Father 31   Lung cancer Maternal Grandmother     Review of Systems: A 12-system review  of systems was performed and was negative except as noted in the HPI.  --------------------------------------------------------------------------------------------------  Physical Exam: BP (!) 120/102 (BP Location: Left Arm, Patient Position: Sitting, Cuff Size: Large)   Pulse 81   Ht 6\' 2"  (1.88 m)   Wt 247 lb (112 kg)   SpO2 97%   BMI 31.71 kg/m   General:  NAD. Neck: No JVD or HJR. Lungs: Clear to auscultation bilaterally without wheezes or crackles. Heart: Regular rate and rhythm without murmurs, rubs, or gallops. Abdomen: Soft, nontender, nondistended. Extremities: No lower extremity edema.  EKG: Normal sinus rhythm with PACs with aberrancy versus PVCs.  Otherwise, no significant abnormality.  Lab Results  Component Value Date   WBC 7.1 01/01/2019   HGB 14.7 01/01/2019   HCT 45.7 01/01/2019   MCV 80.3 01/01/2019   PLT 237 01/01/2019    Lab Results  Component Value Date   NA 136 08/18/2020   K 4.0 08/18/2020   CL 97 08/18/2020   CO2 22 08/18/2020   BUN 20 08/18/2020   CREATININE 1.19 08/18/2020   GLUCOSE 93 08/18/2020   ALT 52 (H) 01/01/2019    No results found for: CHOL, HDL, LDLCALC, LDLDIRECT, TRIG, CHOLHDL  --------------------------------------------------------------------------------------------------  ASSESSMENT AND PLAN: Hypertension: Blood pressure suboptimally controlled today despite being on amlodipine and Maxide.  We discussed 3 challenging with an ARB, though Mr. Picou wishes to defer this as he did not tolerate losartan well in the past.  He also does not wish to retry isosorbide mononitrate.  Given PACs/PVCs on EKG today and elevated blood pressure, we have agreed to add bisoprolol 5 mg daily.  History of pericarditis: No chest pain reported.  No further work-up at this time.  Sarcoidosis: No evidence of cardiac involvement by MRI last year.  Continue prednisone and ongoing management with pulmonology.  Obstructive sleep apnea: Encourage  patient to retry CPAP, particularly if he is able to procure a new mask.  Follow-up: Return to clinic in 2 months.  Nelva Bush, MD 09/02/2020 9:58 AM

## 2020-09-01 ENCOUNTER — Encounter: Payer: Self-pay | Admitting: Physical Therapy

## 2020-09-01 ENCOUNTER — Other Ambulatory Visit: Payer: Self-pay

## 2020-09-01 ENCOUNTER — Ambulatory Visit: Payer: Medicaid Other | Admitting: Physical Therapy

## 2020-09-01 ENCOUNTER — Telehealth: Payer: Self-pay | Admitting: Family Medicine

## 2020-09-01 DIAGNOSIS — M79672 Pain in left foot: Secondary | ICD-10-CM | POA: Diagnosis not present

## 2020-09-01 DIAGNOSIS — M545 Low back pain, unspecified: Secondary | ICD-10-CM | POA: Diagnosis not present

## 2020-09-01 DIAGNOSIS — R262 Difficulty in walking, not elsewhere classified: Secondary | ICD-10-CM | POA: Diagnosis not present

## 2020-09-01 DIAGNOSIS — M79671 Pain in right foot: Secondary | ICD-10-CM | POA: Diagnosis not present

## 2020-09-01 NOTE — Telephone Encounter (Signed)
Patient has not been evaluated for hand pain.  Please advise.

## 2020-09-01 NOTE — Therapy (Signed)
[place] [facility] [address] [place], [place], [address] Phone: [phone]   Fax:  [phone]  Physical Therapy Treatment  Patient Details  Name: Luis Mcknight MRN: [id number] Date of Birth: 1980/08/06 Referring Provider (PT): Rosette Reveal   Encounter Date: 09/01/2020   PT End of Session - 09/01/20 1805     Visit Number 6    Number of Visits 12    Date for PT Re-Evaluation 09/16/20    Authorization Type IE 07/07/2020    Authorization - Visit Number 6    Authorization - Number of Visits 10    Progress Note Due on Visit 10    PT Start Time 1031    PT Stop Time 1117    PT Time Calculation (min) 46 min    Activity Tolerance Patient tolerated treatment well    Behavior During Therapy [facility] for tasks assessed/performed             Past Medical History:  Diagnosis Date   Anxiety    Arthritis    Depression    NO MEDS   GERD (gastroesophageal reflux disease)    NO MEDS   Hypertension    OSA on CPAP    Pneumonia    JUNE 2020   Sleep apnea     Past Surgical History:  Procedure Laterality Date   ELECTROMAGNETIC NAVIGATION BROCHOSCOPY Left 10/08/2018   Procedure: ELECTROMAGNETIC NAVIGATION BRONCHOSCOPY, LEFT;  Surgeon: Tyler Pita, MD;  Location: [facility] OR[facility];  Service: Cardiopulmonary;  Laterality: Left;   ENDOBRONCHIAL ULTRASOUND Left 10/08/2018   Procedure: ENDOBRONCHIAL ULTRASOUND, LEFT;  Surgeon: Tyler Pita, MD;  Location: [facility] OR[facility];  Service: Cardiopulmonary;  Laterality: Left;   NO PAST SURGERIES      There were no vitals filed for this visit.   Subjective Assessment - 09/01/20 1032     Subjective Patient reports low back is doing well. He states it hurts in the AM; it gets better once he is on the move. Patient reports comorbid L shoulder pain. He states his feet are better relative to his previous follow-up in PT. Patient has upcoming MRI to address chronic plantar foot pain. Patient reports he has tried  multiple orthotics before without success. He is compliant with HEP.    Pertinent History Patient has had both issues for > 1 year. 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. Patient worked 12 hour shifts and was on his feet throughout that time which was when his B heel pain was worsened. Patient notes back pain radiates around B flanks. Patient sleeps on his stomach which is his only comfortable position as laying on his side exacerbates his neck and L shoulder/UE pain.    Limitations Lifting;Standing;House hold activities;Walking    How long can you stand comfortably? 15 minutes    How long can you walk comfortably? "doesn't bother me as much" in regard to foot pain    Currently in Pain? Yes    Pain Score 4     Pain Location Foot    Pain Orientation Right;Left    Pain Descriptors / Indicators Aching;Throbbing    Pain Type Chronic pain    Multiple Pain Sites Yes    Pain Score 2    Pain Location Shoulder    Pain Orientation Left    Pain Onset More than a month ago               PT  Education - 09/01/20 1803     Education Details Reviewed pain neuroscience education concepts and use of aerobic exercise, breathing drills, and education to manage pain related to heightened sensitivity of nervous system. Discussed continued plan of care with expected transition of focus to upper quarter complaints.    Person(s) Educated Patient    Methods Explanation    Comprehension Verbalized understanding;Returned demonstration              PALPATION: TTP over bilateral medial longitudinal arch with most pain reported along mid-substance of plantar fascia      TREATMENT   Manual Therapy: DTM/IASTM performed to B calves, B flexor hallucis brevis and quadratus plantae to allow for decreased tension and pain/sensitivity and improved posture and function Manual dorsiflexion stretching in bilateral ankles for improved ROM   *not  today* CPA/UPA T11-L5 mobilizations for decreased spasm and improved mobility, grade II/III   Neuromuscular Re-education: Seated elliptical, Seat 16, L5.0, x10 min for graded exposure to movement and nervous system downregulation  Supine posterior pelvic tilts with coordinated breath for improved lumbar mobility and decreased pain; 2x10 Supine posterior pelvic tilts with bridging with coordinated breath for improved lumbar mobility, core and gluteal strength, and decreased pain; 2x10   *not today* Supine figure 4 stretch, BLE, for improved mobility and decreased pain Supine single knee to chest with diaphragmatic breath for improved lumbar mobility and decreased pain, BLE Supine double knee to chest with diaphragmatic breath for improved lumbar mobility and decreased pain; x10, 1 sec hold  Supine hooklying trunk rotations with QL bias lumbar mobility and decreased pain; x10 ea LE   Therapeutic Exercise: Standing heel raise with great toe extension on towel roll; Double-limb up, single-limb down; for graded exposure to plantar arch load; 10x each LE    *not today* Seated swiss ball rollout; blue physioball; x5, 5 sec    Patient educated throughout session on appropriate technique and form using multi-modal cueing, HEP, and activity modification. Patient articulated understanding and returned demonstration.   Patient Response to interventions: No significant increase in plantar foot pain with progression of graded loading today.      ASSESSMENT Patient does have ongoing foot pain with prolonged standing activity, but NPRS is significantly lower today than that reported at previous sessions. He has improving dorsiflexion and great toe extension mobility relative to his most recent progress note. Patient tolerates graded loading for plantar fascia well with progression of intensity in exercise today without significant increase in pain scale. Patient verbalizes understanding of pain science  concepts and contribution of nervous system changes and contextual factors to pain experience. Patient fortunately has been referred to psychiatry by referring physician to address pscyhosocial contributors. Patient will benefit from continued skilled therapeutic intervention to address the above impairments and activity limitations for best return to PLOF and for improved QoL.     PT Long Term Goals - 08/18/20 1529       PT LONG TERM GOAL #1   Title Patient will be independent with HEP in order to decrease ankle pain and increase strength in order to improve pain-free function at home and work.    Baseline Patient is usually compliant with HEP per report, but does require moderate cueing for recollection of prescribed home exercises    Time 6    Period Weeks    Status Partially Met    Target Date 09/01/20      PT LONG TERM GOAL #2   Title Patient will demonstrate improved function  as evidenced by a score of 48 on FOTO measure for full participation in activities at home and in the community.    Baseline IE: 41, no change noted at this time    Time 6    Period Weeks    Status Not Met    Target Date 09/15/20      PT LONG TERM GOAL #3   Title Patient will decrease worst pain as reported on NPRS by at least 2 points to demonstrate clinically significant reduction in pain in order to restore/improve function and overall QOL.    Baseline IE: 8/10 B heels. 08/18/20: up to 8/10 plantar foot pain bilat    Time 6    Period Weeks    Status Not Met    Target Date 09/15/20      PT LONG TERM GOAL #4   Title Patient will report being able to return to activities including, but not limited to: standing and walking for > 2 hours with pain controlled and minimal limitation to indicate complete resolution of the chief complaint and return to prior level of participation at home and in the community.    Baseline IE: standing < 5 min, walking 30 min. 08/18/20: standing 15 minutes, walking without notable  duration limitation    Time 6    Period Weeks    Status Partially Met    Target Date 09/15/20      PT LONG TERM GOAL #5   Title Patient will demonstrate understanding of basic self-management/down-regulation of the nervous system for persistent pain condition and stress as evidenced by diaphragmatic breathing without cueing, body scan/progressive relaxation meditation, and improved sleep hygiene in order to transition to independent management of patient's chief complaint:persistent pain in multiple regions of the body.    Baseline Ie: not demonstrated; 08/18/20: partial understanding of pain science concepts    Time 6    Period Weeks    Status Partially Met    Target Date 09/15/20               Plan - 09/01/20 1818     Clinical Impression Statement Patient does have ongoing foot pain with prolonged standing activity, but NPRS is significantly lower today than that reported at previous sessions. He has improving dorsiflexion and great toe extension mobility relative to his most recent progress note. Patient tolerates graded loading for plantar fascia well with progression of intensity in exercise today without significant increase in pain scale. Patient verbalizes understanding of pain science concepts and contribution of nervous system changes and contextual factors to pain experience. Patient fortunately has been referred to psychiatry by referring physician to address pscyhosocial contributors. Patient will benefit from continued skilled therapeutic intervention to address the above impairments and activity limitations for best return to PLOF and for improved QoL.    Personal Factors and Comorbidities Age;Behavior Pattern;Comorbidity 3+;Past/Current Experience;Time since onset of injury/illness/exacerbation;Profession    Comorbidities anxiety, arthritis, depression, GERD, OSA    Examination-Activity Limitations Bed Mobility;Sleep;Sit;Squat;Stairs;Stand;Locomotion Level;Lift     Examination-Participation Restrictions Occupation;Cleaning;Yard Work;Community Activity;Shop    Stability/Clinical Decision Making Evolving/Moderate complexity    Rehab Potential Fair    PT Frequency 2x / week    PT Duration 6 weeks    PT Treatment/Interventions Taping;Dry needling;Joint Manipulations;Spinal Manipulations;Manual techniques;Patient/family education;Neuromuscular re-education;Therapeutic exercise;Moist Heat;Cryotherapy;Electrical Stimulation    PT Next Visit Plan manual as needed, trunk stabilization, BLE stretching, graded loading    PT Home Exercise Plan HEP Access Code [email or web address]    Consulted and Agree with  Plan of Care Patient             Patient will benefit from skilled therapeutic intervention in order to improve the following deficits and impairments:  Pain, Improper body mechanics, Difficulty walking, Hypomobility, Decreased activity tolerance  Visit Diagnosis: Bilateral foot pain  Low back pain, unspecified back pain laterality, unspecified chronicity, unspecified whether sciatica present  Difficulty in walking, not elsewhere classified     Problem List Patient Active Problem List   Diagnosis Date Noted   Metatarsalgia, right foot 08/28/2020   Adjustment reaction with anxiety and depression 08/28/2020   Fibromyalgia 08/28/2020   Plantar fasciitis, right 07/14/2020   Cervical spondylosis with radiculopathy 06/01/2020   Supraspinatus syndrome, 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 [place], [place], [address] Phone: [phone]   Fax:  [phone]  Name: Luis Mcknight MRN: [id number] Date of Birth: 18-Dec-1980

## 2020-09-01 NOTE — Telephone Encounter (Signed)
Pt is calling to see if dr Zigmund Daniel could also order MRI of hands due to frost bite to check the nerve damage. Pt seen dr Zigmund Daniel on 08-28-2020

## 2020-09-02 ENCOUNTER — Encounter: Payer: Self-pay | Admitting: Internal Medicine

## 2020-09-02 ENCOUNTER — Ambulatory Visit (INDEPENDENT_AMBULATORY_CARE_PROVIDER_SITE_OTHER): Payer: Medicaid Other | Admitting: Internal Medicine

## 2020-09-02 ENCOUNTER — Telehealth: Payer: Self-pay | Admitting: Internal Medicine

## 2020-09-02 VITALS — BP 120/102 | HR 81 | Ht 74.0 in | Wt 247.0 lb

## 2020-09-02 DIAGNOSIS — D869 Sarcoidosis, unspecified: Secondary | ICD-10-CM | POA: Diagnosis not present

## 2020-09-02 DIAGNOSIS — Z8679 Personal history of other diseases of the circulatory system: Secondary | ICD-10-CM | POA: Diagnosis not present

## 2020-09-02 DIAGNOSIS — I10 Essential (primary) hypertension: Secondary | ICD-10-CM

## 2020-09-02 DIAGNOSIS — G4733 Obstructive sleep apnea (adult) (pediatric): Secondary | ICD-10-CM

## 2020-09-02 MED ORDER — BISOPROLOL FUMARATE 5 MG PO TABS: 5.0000 mg | ORAL_TABLET | Freq: Every day | ORAL | 3 refills | Status: DC

## 2020-09-02 NOTE — Telephone Encounter (Signed)
Patient notified of Dr. Zigmund Daniel' recommendation through [facility].

## 2020-09-02 NOTE — Telephone Encounter (Signed)
Patient calling  States that insurance will not cover medications Would like to know other options  Please call

## 2020-09-02 NOTE — Patient Instructions (Signed)
Medication Instructions:  Your physician has recommended you make the following change in your medication:   START Bisoprolol 5mg  daily   *If you need a refill on your cardiac medications before your next appointment, please call your pharmacy*   Lab Work:  None ordered  Testing/Procedures:  None ordered  Follow-Up: At [facility], you and your health needs are our priority.  As part of our continuing mission to provide you with exceptional heart care, we have created designated Provider Care Teams.  These Care Teams include your primary Cardiologist (physician) and Advanced Practice Providers (APPs -  Physician Assistants and Nurse Practitioners) who all work together to provide you with the care you need, when you need it.  We recommend signing up for the patient portal called "MyChart".  Sign up information is provided on this After Visit Summary.  MyChart is used to connect with patients for Virtual Visits (Telemedicine).  Patients are able to view lab/test results, encounter notes, upcoming appointments, etc.  Non-urgent messages can be sent to your provider as well.   To learn more about what you can do with MyChart, go to [email or web address].    Your next appointment:   2 month(s)  The format for your next appointment:   In Person  Provider:   You may see Nelva Bush, MD or one of the following Advanced Practice Providers on your designated Care Team:   Murray Hodgkins, NP Christell Faith, PA-C Marrianne Mood, PA-C Cadence Bonaparte, [place] Laurann Montana, NP

## 2020-09-02 NOTE — Telephone Encounter (Signed)
For your information  

## 2020-09-03 ENCOUNTER — Encounter: Payer: Self-pay | Admitting: Internal Medicine

## 2020-09-03 NOTE — Telephone Encounter (Signed)
Patient returning call.

## 2020-09-03 NOTE — Telephone Encounter (Signed)
Attempted to call pt. No answer. Lmtcb.  

## 2020-09-04 DIAGNOSIS — G4733 Obstructive sleep apnea (adult) (pediatric): Secondary | ICD-10-CM | POA: Diagnosis not present

## 2020-09-04 MED ORDER — BISOPROLOL FUMARATE 5 MG PO TABS: 5.0000 mg | ORAL_TABLET | Freq: Every day | ORAL | 3 refills | Status: DC

## 2020-09-04 NOTE — Telephone Encounter (Signed)
Spoke to pt, he states new medication that was added on 09/02/20 - Bisoprolol 5mg  QD is too expensive.  Pt's cost at [facility] is $30. Pt expresses financial concerns.  Notified pt that I see Bisoprolol only $9 using GoodRx if sent to [facility].  Pt appreciative of assistance and requests Rx be sent to [facility] in [place] and he will use GoodRx coupon.  I called [facility] to make aware to cancel Rx for Bisoprolol.  New Rx sent to [facility] in [place].  Pt has no further needs at this time.

## 2020-09-08 ENCOUNTER — Encounter: Payer: Medicaid Other | Admitting: Physical Therapy

## 2020-09-12 ENCOUNTER — Other Ambulatory Visit: Payer: Self-pay | Admitting: Family

## 2020-09-12 DIAGNOSIS — I10 Essential (primary) hypertension: Secondary | ICD-10-CM

## 2020-09-15 ENCOUNTER — Ambulatory Visit: Payer: Medicaid Other | Attending: Family Medicine | Admitting: Physical Therapy

## 2020-09-15 ENCOUNTER — Other Ambulatory Visit: Payer: Self-pay

## 2020-09-15 DIAGNOSIS — R262 Difficulty in walking, not elsewhere classified: Secondary | ICD-10-CM | POA: Diagnosis not present

## 2020-09-15 DIAGNOSIS — G8929 Other chronic pain: Secondary | ICD-10-CM | POA: Diagnosis not present

## 2020-09-15 DIAGNOSIS — M79671 Pain in right foot: Secondary | ICD-10-CM | POA: Diagnosis not present

## 2020-09-15 DIAGNOSIS — M545 Low back pain, unspecified: Secondary | ICD-10-CM | POA: Insufficient documentation

## 2020-09-15 DIAGNOSIS — M79672 Pain in left foot: Secondary | ICD-10-CM | POA: Insufficient documentation

## 2020-09-15 DIAGNOSIS — M25512 Pain in left shoulder: Secondary | ICD-10-CM | POA: Insufficient documentation

## 2020-09-15 NOTE — Therapy (Signed)
[facility] [address]. [place], [place], [address] Phone: [phone]   Fax:  [phone]  Physical Therapy Treatment/Re-evaluation/Re-certification    Dates of reporting period  08/18/2020   to   09/16/2020  Patient Details  Name: Luis Mcknight MRN: [id number] Date of Birth: 22-Jul-1980 Referring Provider (PT): Rosette Reveal   Encounter Date: 09/15/2020   PT End of Session - 09/16/20 2200     Visit Number 7    Number of Visits 12    Date for PT Re-Evaluation 09/16/20    Authorization Type IE 05/10/5730, re-eval/re-cert 04/08/52    Authorization - Visit Number 7    Authorization - Number of Visits 10    Progress Note Due on Visit 10    PT Start Time 2706    PT Stop Time 1655    PT Time Calculation (min) 50 min    Activity Tolerance Patient tolerated treatment well    Behavior During Therapy [facility] for tasks assessed/performed             Past Medical History:  Diagnosis Date   Anxiety    Arthritis    Depression    NO MEDS   GERD (gastroesophageal reflux disease)    NO MEDS   Hypertension    OSA on CPAP    Pneumonia    JUNE 2020   Sleep apnea     Past Surgical History:  Procedure Laterality Date   ELECTROMAGNETIC NAVIGATION BROCHOSCOPY Left 10/08/2018   Procedure: ELECTROMAGNETIC NAVIGATION BRONCHOSCOPY, LEFT;  Surgeon: Tyler Pita, MD;  Location: [facility] ORS;  Service: Cardiopulmonary;  Laterality: Left;   ENDOBRONCHIAL ULTRASOUND Left 10/08/2018   Procedure: ENDOBRONCHIAL ULTRASOUND, LEFT;  Surgeon: Tyler Pita, MD;  Location: [facility] ORS;  Service: Cardiopulmonary;  Laterality: Left;   NO PAST SURGERIES      There were no vitals filed for this visit.   Subjective Assessment - 09/15/20 1609     Subjective Patient reports notable pain upon waking in his low back one morning, but this has since improved. He reports compliance with his updated HEP. He reports more pain affecting his R knee recently that  flared up with stepping over at item. He states he would like to address upper quarter complaints at this time. He reports he has pain along L UT with referral down LUE to his fingers. He reports pain running into 4th-5th digits. He reports pain along ACJ and deltopectoral groove region that runs into his LUE. Patient reports atraumatic onset. He reports this has been present since he was treated for heart attack per report. Patient reports aching quality of pain. Some numbnes affecting 4th-5th digits, other digits affected along distal phalanx only following frostbite. Patient reports no previous therapy or treatment for upper quarter condition - no previous surgeries. He state she is able to raise his arm overhead. He states that when his blood pressure is up, he feels more pain affecting his L arm in biceps region. Recent (-) ECG. Relieving factors: stress ball, lying on L side.  Aggravated by: increase in blood pressure. Patient reports he is still having difficulty with blood pressure control. Pt is not yet set up with psychiatrist. He reports no 24-hour pain behavior. Dominant hand: Right. Pt denies night pain. Pt has notable stressors recently related to uncertainty of his future career path with difficulty tolerating standing work or physical work.    Pertinent History Patient has had both issues for > 1 year. 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 Patient worked 12 hour shifts and was on his feet throughout that time which was when his B heel pain was worsened. Patient notes back pain radiates around B flanks. Patient sleeps on his stomach which is his only comfortable position as laying on his side exacerbates his neck and L shoulder/UE pain.    Limitations Lifting;Standing;House hold activities;Walking    How long can you stand comfortably? 15 minutes    How long can you walk comfortably? "doesn't bother me as much" in regard to foot pain     Currently in Pain? No/denies    Pain Onset More than a month ago    Pain Onset More than a month ago              OBJECTIVE   Cervical Screen AROM:  Flexion: WNL Extension: 42*  Lateral flexion: R 42, L 42 Cervical rotation: R 70, L 62* Spurlings A (ipsilateral lateral flexion/axial compression): R: Negative L: Positive Repeated movement: Not tested Hoffman Sign (cervical cord compression): R: Negative L: Negative ULTT Median: R: Positive L: Positive ULTT Ulnar: R: Positive L: Positive ULTT Radial: R: Negative L: Negative  Elbow Screen Elbow AROM: Within Normal Limits  Palpation Tenderness to palpation along L UT, L LS, L pec major   Strength R/L 5/4- Shoulder flexion (anterior deltoid/pec major/coracobrachialis, axillary n. (C5-6) and musculocutaneous n. (C5-7)) 4+/4 Shoulder abduction (deltoid/supraspinatus, axillary/suprascapular n, C5) 5/4- Shoulder external rotation (infraspinatus/teres minor) 5/4- Shoulder internal rotation (subcapularis/lats/pec major) 5/5 Shoulder extension (posterior deltoid, lats, teres major, axillary/thoracodorsal n.) 5/5 Shoulder horizontal abduction 5/4+ Elbow flexion (biceps brachii, brachialis, brachioradialis, musculoskeletal n, C5-6) 5/4+ Elbow extension (triceps, radial n, C7) 5/4- Wrist Extension 5/4- Wrist Flexion 5/5 Finger adduction (interossei, ulnar n, T1)  AROM R/L 152/143 Shoulder flexion 173/152 Shoulder abduction WFL/WFL Shoulder external rotation *Indicates pain, overpressure performed unless otherwise indicated  PROM R/L WNL/160 Shoulder flexion WNL/155 Shoulder abduction WNL/80 Shoulder external rotation 70/70 Shoulder internal rotation *Indicates pain, overpressure performed unless otherwise indicated   NEUROLOGICAL:  Sensation Decreased light touch sensation along distal phalanges bilateral UE, all digits Proprioception and hot/cold testing deferred on this date  SPECIAL TESTS  Rotator Cuff   Drop Arm Test: Negative Painful Arc (Pain from 60 to 120 degrees scaption): Negative Infraspinatus Muscle Test: Negative  Adhesive Capsulitis Coracoid Palpation: Negative  Thoracic Outlet Adson Test: Negative    TREATMENT   Manual Therapy - for symptom modulation, soft tissue sensitivity and mobility, joint mobility, ROM  Manual cervical traction in supine, intermittent STM/DTM L UT, L LS   Therapeutic Activities - patient education and HEP establishment  Ulnar nerve flossing; x10 [moderate verbal cueing and demonstration] Seated Upper Trapezius stretch; x30sec Seated Levator Scapulae stretch; x30sec  Patient education: Discussed concern for hypertension contributing to symptoms and to follow-up with his physician regarding blood pressure control and associated upper quarter pain. Discussed role of PT and current condition. Patient education on current prognosis and continued plan of care     Patient Response to interventions: Diminished LUE referred symptoms with use of manual cervical traction     ASSESSMENT Patient is continuing with HEP for lumbar spine mobility, flexion-bias exercise, and strengthening. He is also continuing with daily aerobic exercise and graded loading for plantar fascia as well as pertinent mobility work. The patient feels that upper quarter needs should be addressed at this time. Current clinical presentation is consistent with cervical spine referred pain  and likely associated with central/peripheral sensitization characteristic of fibromyalgia. Patient reports L upper quarter symptoms occur with increase in blood pressure. Discussed with patient signs of cardiac ischemia and discussed follow-up with his MD regarding blood pressure control. (-) recent EKG. He has current deficits in L shoulder AROM and pain with cervical extension and L lateral flexion, postural changes, and decreased strength. Patient will benefit from continued skilled therapeutic  intervention to address the above impairments and activity limitations for best return to PLOF and for improved QoL.        PT Long Term Goals - 09/16/20 2226       PT LONG TERM GOAL #1   Title Patient will be independent with HEP in order to decrease ankle pain and increase strength in order to improve pain-free function at home and work.    Baseline 08/18/20: Some difficulty with HEP recollection; 09/15/20: Patient is compliant    Time 6    Period Weeks    Status Partially Met    Target Date 09/01/20      PT LONG TERM GOAL #2   Title Patient will demonstrate improved function as evidenced by a score of 48 on FOTO measure for full participation in activities at home and in the community.    Baseline IE: 41, no change noted at this time. 09/15/20: 42    Time 6    Period Weeks    Status Not Met    Target Date 10/16/20      PT LONG TERM GOAL #3   Title Patient will decrease worst pain as reported on NPRS by at least 2 points to demonstrate clinically significant reduction in pain in order to restore/improve function and overall QOL.    Baseline IE: 8/10 B heels. 08/18/20: up to 8/10 plantar foot pain bilat    Time 6    Period Weeks    Status Not Met      PT LONG TERM GOAL #4   Title Patient will report being able to return to activities including, but not limited to: standing and walking for > 2 hours with pain controlled and minimal limitation to indicate complete resolution of the chief complaint and return to prior level of participation at home and in the community.    Baseline IE: standing < 5 min, walking 30 min. 08/18/20: standing 15 minutes, walking without notable duration limitation    Time 6    Period Weeks    Status Partially Met      PT LONG TERM GOAL #5   Title Patient will demonstrate understanding of basic self-management/down-regulation of the nervous system for persistent pain condition and stress as evidenced by diaphragmatic breathing without cueing, body  scan/progressive relaxation meditation, and improved sleep hygiene in order to transition to independent management of patient's chief complaint:persistent pain in multiple regions of the body.    Baseline Ie: not demonstrated; 08/18/20: partial understanding of pain science concepts    Time 6    Period Weeks    Status Partially Met      PT LONG TERM GOAL #6   Title Patient will be independent and compliant with advaned HEP as needed for self-management of condition and improved self-care, performance of household tasks, and sleep hygiene    Baseline 09/15/20: New HEP initiated    Time 3    Period Weeks    Status New    Target Date 10/07/20  Plan - 09/16/20 2225     Clinical Impression Statement Patient is continuing with HEP for lumbar spine mobility, flexion-bias exercise, and strengthening. He is also continuing with daily aerobic exercise and graded loading for plantar fascia as well as pertinent mobility work. The patient feels that upper quarter needs should be addressed at this time. Current clinical presentation is consistent with cervical spine referred pain and likely associated with central/peripheral sensitization characteristic of fibromyalgia. Patient reports L upper quarter symptoms occur with increase in blood pressure. Discussed with patient signs of cardiac ischemia and discussed follow-up with his MD regarding blood pressure control. (-) recent EKG. He has current deficits in L shoulder AROM and pain with cervical extension and L lateral flexion, postural changes, and decreased strength. Patient will benefit from continued skilled therapeutic intervention to address the above impairments and activity limitations for best return to PLOF and for improved QoL.    Personal Factors and Comorbidities Age;Behavior Pattern;Comorbidity 3+;Past/Current Experience;Time since onset of injury/illness/exacerbation;Profession    Comorbidities anxiety, arthritis,  depression, GERD, OSA    Examination-Activity Limitations Bed Mobility;Sleep;Sit;Squat;Stairs;Stand;Locomotion Level;Lift    Examination-Participation Restrictions Occupation;Cleaning;Yard Work;Community Activity;Shop    Stability/Clinical Decision Making Evolving/Moderate complexity    Rehab Potential Fair    PT Frequency 2x / week    PT Duration 6 weeks    PT Treatment/Interventions Taping;Dry needling;Joint Manipulations;Spinal Manipulations;Manual techniques;Patient/family education;Neuromuscular re-education;Therapeutic exercise;Moist Heat;Cryotherapy;Electrical Stimulation    PT Next Visit Plan Manual therapy and traction for symptom modulation, postural re-edu, specific soft tissue stretching, neurodynamics; update HEP with successive PT visits    PT Home Exercise Plan HEP Access Code [email or web address]    Consulted and Agree with Plan of Care Patient             Patient will benefit from skilled therapeutic intervention in order to improve the following deficits and impairments:  Pain, Improper body mechanics, Difficulty walking, Hypomobility, Decreased activity tolerance  Visit Diagnosis: Bilateral foot pain  Low back pain, unspecified back pain laterality, unspecified chronicity, unspecified whether sciatica present  Difficulty in walking, not elsewhere classified     Problem List Patient Active Problem List   Diagnosis Date Noted   Metatarsalgia, right foot 08/28/2020   Adjustment reaction with anxiety and depression 08/28/2020   Fibromyalgia 08/28/2020   Plantar fasciitis, right 07/14/2020   Cervical spondylosis with radiculopathy 06/01/2020   Supraspinatus syndrome, 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   Lumbar spondylosis 06/01/2020   Plantar fasciitis, 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   Chest pain of uncertain etiology 39/58/4417   OSA (obstructive sleep apnea) 06/04/2019   Current smoker 06/04/2019   Healthcare maintenance 06/04/2019   Acute pericarditis 04/03/2019   Sarcoidosis 10/16/2018   Lung  nodules    Adenopathy    Essential hypertension 11/28/2017   Valentina Gu, PT, DPT [phone]  Eilleen Kempf 09/16/2020, 10:40 PM  [facility] [address] [place], [place], [address] Phone: [phone]   Fax:  [phone]  Name: Luis Mcknight MRN: [id number] Date of Birth: [birth year]

## 2020-09-16 ENCOUNTER — Encounter: Payer: Self-pay | Admitting: Physical Therapy

## 2020-09-21 ENCOUNTER — Telehealth: Payer: Self-pay | Admitting: Family Medicine

## 2020-09-21 NOTE — Telephone Encounter (Signed)
Pt is calling to get advice from Dr. Rodena Piety. Pt received letter from his insurance that his MRI was denied by Dr. Zigmund Daniel. And his insurance is needing verification that he has been doing PT. Please advise with the pt. [phone]

## 2020-09-22 ENCOUNTER — Encounter: Payer: Self-pay | Admitting: Physical Therapy

## 2020-09-22 ENCOUNTER — Ambulatory Visit: Payer: Medicaid Other | Admitting: Physical Therapy

## 2020-09-22 ENCOUNTER — Encounter: Payer: Self-pay | Admitting: Family Medicine

## 2020-09-22 ENCOUNTER — Other Ambulatory Visit: Payer: Self-pay

## 2020-09-22 ENCOUNTER — Other Ambulatory Visit: Payer: Self-pay | Admitting: Family Medicine

## 2020-09-22 DIAGNOSIS — M545 Low back pain, unspecified: Secondary | ICD-10-CM

## 2020-09-22 DIAGNOSIS — M79671 Pain in right foot: Secondary | ICD-10-CM

## 2020-09-22 DIAGNOSIS — G8929 Other chronic pain: Secondary | ICD-10-CM

## 2020-09-22 DIAGNOSIS — R262 Difficulty in walking, not elsewhere classified: Secondary | ICD-10-CM

## 2020-09-22 DIAGNOSIS — M79672 Pain in left foot: Secondary | ICD-10-CM | POA: Diagnosis not present

## 2020-09-22 DIAGNOSIS — M25512 Pain in left shoulder: Secondary | ICD-10-CM | POA: Diagnosis not present

## 2020-09-22 DIAGNOSIS — M722 Plantar fascial fibromatosis: Secondary | ICD-10-CM

## 2020-09-22 NOTE — Therapy (Signed)
[facility] [address] [place], [place], [address] Phone: [phone]   Fax:  [phone]  Physical Therapy Treatment  Patient Details  Name: Luis Mcknight MRN: [id number] Date of Birth: 11-03-1980 Referring Provider (PT): Rosette Reveal   Encounter Date: 09/22/2020   PT End of Session - 09/22/20 2126     Visit Number 8    Number of Visits 12    Date for PT Re-Evaluation 09/16/20    Authorization Type IE 11/10/1882, re-eval/re-cert 1/66/06    Authorization Time Period 5/10-8/8    Authorization - Visit Number 8    Authorization - Number of Visits 10    Progress Note Due on Visit 10    PT Start Time 0900    PT Stop Time 0944    PT Time Calculation (min) 44 min    Activity Tolerance Patient tolerated treatment well    Behavior During Therapy [facility] for tasks assessed/performed             Past Medical History:  Diagnosis Date   Anxiety    Arthritis    Depression    NO MEDS   GERD (gastroesophageal reflux disease)    NO MEDS   Hypertension    OSA on CPAP    Pneumonia    JUNE 2020   Sleep apnea     Past Surgical History:  Procedure Laterality Date   ELECTROMAGNETIC NAVIGATION BROCHOSCOPY Left 10/08/2018   Procedure: ELECTROMAGNETIC NAVIGATION BRONCHOSCOPY, LEFT;  Surgeon: Tyler Pita, MD;  Location: [facility] ORS;  Service: Cardiopulmonary;  Laterality: Left;   ENDOBRONCHIAL ULTRASOUND Left 10/08/2018   Procedure: ENDOBRONCHIAL ULTRASOUND, LEFT;  Surgeon: Tyler Pita, MD;  Location: [facility] ORS;  Service: Cardiopulmonary;  Laterality: Left;   NO PAST SURGERIES      There were no vitals filed for this visit.   Subjective Assessment - 09/22/20 0903     Subjective Patient has called his physicians office regarding blood pressure and association with left upper quarter pain. Patient reports compliance with his prevoius HEP and his updated HEP. Patient reports he has been "slacking" some with using his bike  throughout the week. Patient reports having difficulty with small seat on his upright bike. Patient reports no notable L arm pain at arrival to PT. He reports feeling it the previous evening when turning in bed - worse with lying on his stomach. He reports pain with lying supine.    Pertinent History Patient has had both issues for > 1 year. 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 Patient worked 12 hour shifts and was on his feet throughout that time which was when his B heel pain was worsened. Patient notes back pain radiates around B flanks. Patient sleeps on his stomach which is his only comfortable position as laying on his side exacerbates his neck and L shoulder/UE pain.    Limitations Lifting;Standing;House hold activities;Walking    How long can you stand comfortably? 15 minutes    How long can you walk comfortably? "doesn't bother me as much" in regard to foot pain    Pain Onset More than a month ago    Pain Onset More than a month ago             OBJECTIVE FINDINGS  Roo's: Positive for paresthesia in 4th-5th digit L  Ewalt Test: Negative Adson Test: Negative  Taut and tender L UT, L LS, L pectoralis major  TREATMENT   Manual Therapy - for symptom modulation, soft tissue sensitivity and mobility, joint mobility, ROM   Manual cervical traction in supine, intermittent STM/DTM L UT, L LS, L pectoralis major     Therapeutic Activities - patient education and HEP establishment   Ulnar nerve flossing; 2x10 [moderate verbal cueing and demonstration] Seated Upper Trapezius stretch; 2x30sec Seated Levator Scapulae stretch; 1x30sec Pectoralis stretch in doorway (90 deg abduction); 3x30sec Cervical retraction; 1x10; supine    Patient education: Discussed patient's current strategies for blood pressure control and encouraged follow-up with his physician regarding medical management of hypertension.        Patient  Response to interventions: Diminished LUE referred symptoms with use of manual cervical traction     ASSESSMENT Patient has minimal upper quarter pain during PT today, though he does have intermittent radiating pain to L tricep region and intermittently to lateral forearm/epicondyle region. His symptoms are eliminated with use of manual cervical retraction. Stretching program is progressed in clinic today with focus on postural re-edu and specific soft tissue mobility deficits noted at IE. He has current deficits in L shoulder AROM and pain with cervical extension and L lateral flexion, postural changes, and decreased strength. Patient will benefit from continued skilled therapeutic intervention to address the above impairments and activity limitations for best return to PLOF and for improved QoL.             PT Long Term Goals - 09/16/20 2226       PT LONG TERM GOAL #1   Title Patient will be independent with HEP in order to decrease ankle pain and increase strength in order to improve pain-free function at home and work.    Baseline 08/18/20: Some difficulty with HEP recollection; 09/15/20: Patient is compliant    Time 6    Period Weeks    Status Partially Met    Target Date 09/01/20      PT LONG TERM GOAL #2   Title Patient will demonstrate improved function as evidenced by a score of 48 on FOTO measure for full participation in activities at home and in the community.    Baseline IE: 41, no change noted at this time. 09/15/20: 42    Time 6    Period Weeks    Status Not Met    Target Date 10/16/20      PT LONG TERM GOAL #3   Title Patient will decrease worst pain as reported on NPRS by at least 2 points to demonstrate clinically significant reduction in pain in order to restore/improve function and overall QOL.    Baseline IE: 8/10 B heels. 08/18/20: up to 8/10 plantar foot pain bilat    Time 6    Period Weeks    Status Not Met      PT LONG TERM GOAL #4   Title Patient will  report being able to return to activities including, but not limited to: standing and walking for > 2 hours with pain controlled and minimal limitation to indicate complete resolution of the chief complaint and return to prior level of participation at home and in the community.    Baseline IE: standing < 5 min, walking 30 min. 08/18/20: standing 15 minutes, walking without notable duration limitation    Time 6    Period Weeks    Status Partially Met      PT LONG TERM GOAL #5   Title Patient will demonstrate understanding of basic self-management/down-regulation of the nervous system for  persistent pain condition and stress as evidenced by diaphragmatic breathing without cueing, body scan/progressive relaxation meditation, and improved sleep hygiene in order to transition to independent management of patient's chief complaint:persistent pain in multiple regions of the body.    Baseline Ie: not demonstrated; 08/18/20: partial understanding of pain science concepts    Time 6    Period Weeks    Status Partially Met      PT LONG TERM GOAL #6   Title Patient will be independent and compliant with advaned HEP as needed for self-management of condition and improved self-care, performance of household tasks, and sleep hygiene    Baseline 09/15/20: New HEP initiated    Time 3    Period Weeks    Status New    Target Date 10/07/20                   Plan - 09/22/20 2132     Clinical Impression Statement Patient has minimal upper quarter pain during PT today, though he does have intermittent radiating pain to L tricep region and intermittently to lateral forearm/epicondyle region. His symptoms are eliminated with use of manual cervical retraction. Stretching program is progressed in clinic today with focus on postural re-edu and specific soft tissue mobility deficits noted at IE. He has current deficits in L shoulder AROM and pain with cervical extension and L lateral flexion, postural changes, and  decreased strength. Patient will benefit from continued skilled therapeutic intervention to address the above impairments and activity limitations for best return to PLOF and for improved QoL.    Personal Factors and Comorbidities Age;Behavior Pattern;Comorbidity 3+;Past/Current Experience;Time since onset of injury/illness/exacerbation;Profession    Comorbidities anxiety, arthritis, depression, GERD, OSA    Examination-Activity Limitations Bed Mobility;Sleep;Sit;Squat;Stairs;Stand;Locomotion Level;Lift    Examination-Participation Restrictions Occupation;Cleaning;Yard Work;Community Activity;Shop    Stability/Clinical Decision Making Evolving/Moderate complexity    Rehab Potential Fair    PT Frequency 2x / week    PT Duration 6 weeks    PT Treatment/Interventions Taping;Dry needling;Joint Manipulations;Spinal Manipulations;Manual techniques;Patient/family education;Neuromuscular re-education;Therapeutic exercise;Moist Heat;Cryotherapy;Electrical Stimulation    PT Next Visit Plan Manual therapy and traction for symptom modulation, postural re-edu, specific soft tissue stretching, neurodynamics; update HEP with successive PT visits    PT Home Exercise Plan HEP Access Code [email or web address]    Consulted and Agree with Plan of Care Patient             Patient will benefit from skilled therapeutic intervention in order to improve the following deficits and impairments:  Pain, Improper body mechanics, Difficulty walking, Hypomobility, Decreased activity tolerance  Visit Diagnosis: Chronic left shoulder pain  Low back pain, unspecified back pain laterality, unspecified chronicity, unspecified whether sciatica present  Bilateral foot pain  Difficulty in walking, not elsewhere classified     Problem List Patient Active Problem List   Diagnosis Date Noted   Metatarsalgia, right foot 08/28/2020   Adjustment reaction with anxiety and depression 08/28/2020   Fibromyalgia 08/28/2020   Plantar  fasciitis, right 07/14/2020   Cervical spondylosis with radiculopathy 06/01/2020   Supraspinatus syndrome, 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   Lumbar spondylosis 06/01/2020   Plantar fasciitis, 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   Chest pain of uncertain etiology 63/84/5364   OSA (obstructive sleep apnea) 06/04/2019   Current smoker 06/04/2019   Healthcare maintenance 06/04/2019   Acute pericarditis 04/03/2019   Sarcoidosis 10/16/2018   Lung nodules    Adenopathy    Essential hypertension 11/28/2017   Valentina Gu, PT, DPT [phone] Eilleen Kempf 09/22/2020, 9:34 PM  [facility] [address] [place], [place], [address] Phone: [phone]   Fax:  [phone]  Name: Bearett Porcaro MRN: [id number] Date of Birth: 08-Oct-1980

## 2020-09-23 ENCOUNTER — Ambulatory Visit
Admission: RE | Admit: 2020-09-23 | Discharge: 2020-09-23 | Disposition: A | Discharge status: Home / Self Care | Payer: Medicaid Other | Attending: Family Medicine | Admitting: Family Medicine

## 2020-09-23 ENCOUNTER — Ambulatory Visit
Admission: RE | Admit: 2020-09-23 | Discharge: 2020-09-23 | Disposition: A | Discharge status: Home / Self Care | Payer: Medicaid Other | Source: Ambulatory Visit | Attending: Family Medicine | Admitting: Family Medicine

## 2020-09-23 ENCOUNTER — Other Ambulatory Visit: Payer: Self-pay

## 2020-09-23 DIAGNOSIS — M7731 Calcaneal spur, right foot: Secondary | ICD-10-CM | POA: Diagnosis not present

## 2020-09-23 DIAGNOSIS — M722 Plantar fascial fibromatosis: Secondary | ICD-10-CM | POA: Insufficient documentation

## 2020-09-23 NOTE — Telephone Encounter (Signed)
Patient notified and verbalized understanding.  Will obtain X-Rays of the feet today.  Tolerating PT well, but still in pain even with home exercises. Will resubmit for PA after X-Rays have resulted.  For your information.

## 2020-09-24 IMAGING — MR MR CARD MORPHOLOGY WO/W CM
30 of 32 series · 38 of 40 positions shown · IV contrast (gadavist)
Comparison: none

CLINICAL DATA: Cardiomyopathy r/o Sarcoid

EXAM:
CARDIAC MRI
TECHNIQUE: The patient was scanned on a 1.5 Tesla Siemens magnet. A dedicated
cardiac coil was used. Functional imaging was done using Fiesta
sequences. [DATE], and 4 chamber views were done to assess for RWMA's.
Modified Charo rule using a short axis stack was used to
calculate an ejection fraction on a dedicated work station using
Circle software. The patient received 14 cc of Gadavist . After 10
minutes inversion recovery sequences were used to assess for
infiltration and scar tissue.
CONTRAST:  Gadavist

[Series 6: bSSFP · oblique · 8.0mm · 1.61mm/px · 1 of 25 slices shown (1 of 20)]
[im 1/25]
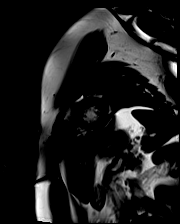

[Series 6: bSSFP · oblique · 8.0mm · 1.61mm/px · 1 of 25 slices shown (2 of 20)]
[im 1/25]
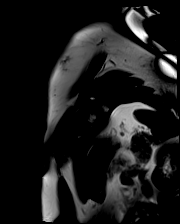

[Series 6: bSSFP · oblique · 8.0mm · 1.61mm/px · 1 of 25 slices shown (3 of 20)]
[im 1/25]
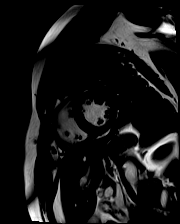

[Series 6: bSSFP · oblique · 8.0mm · 1.61mm/px · 1 of 25 slices shown (4 of 20)]
[im 1/25]
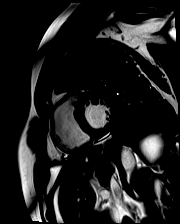

[Series 6: bSSFP · oblique · 8.0mm · 1.61mm/px · 1 of 25 slices shown (5 of 20)]
[im 1/25]
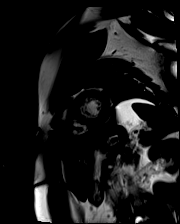

[Series 6: bSSFP · oblique · 8.0mm · 1.61mm/px · 1 of 25 slices shown (6 of 20)]
[im 1/25]
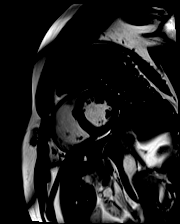

[Series 6: bSSFP · oblique · 8.0mm · 1.61mm/px · 1 of 25 slices shown (7 of 20)]
[im 1/25]
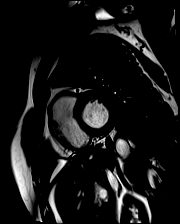

[Series 6: bSSFP · oblique · 8.0mm · 1.61mm/px · 1 of 25 slices shown (8 of 20)]
[im 1/25]
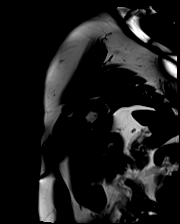

[Series 6: bSSFP · oblique · 8.0mm · 1.61mm/px · 2 of 25 slices shown (9 of 20)]
[im 1/25]
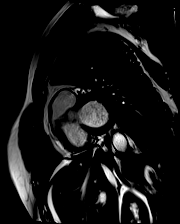
[im 25/25]
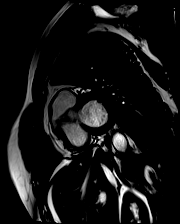

[Series 6: bSSFP · oblique · 8.0mm · 1.61mm/px · 1 of 25 slices shown (10 of 20)]
[im 1/25]
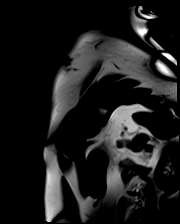

[Series 6: bSSFP · oblique · 8.0mm · 1.61mm/px · 2 of 25 slices shown (11 of 20)]
[im 1/25]
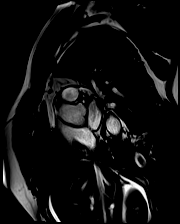
[im 25/25]
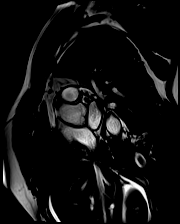

[Series 6: bSSFP · oblique · 8.0mm · 1.61mm/px · 2 of 25 slices shown (12 of 20)]
[im 1/25]
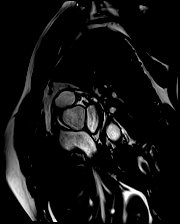
[im 25/25]
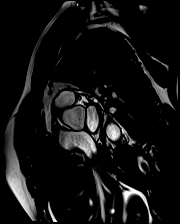

[Series 6: bSSFP · oblique · 8.0mm · 1.61mm/px · 2 of 25 slices shown (13 of 20)]
[im 1/25]
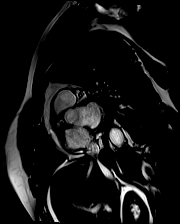
[im 25/25]
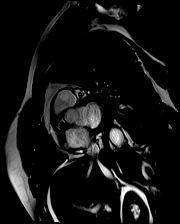

[Series 6: bSSFP · oblique · 8.0mm · 1.61mm/px · 1 of 25 slices shown (14 of 20)]
[im 1/25]
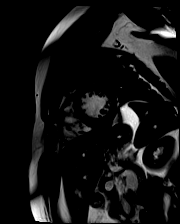

[Series 6: bSSFP · oblique · 8.0mm · 1.61mm/px · 1 of 25 slices shown (15 of 20)]
[im 1/25]
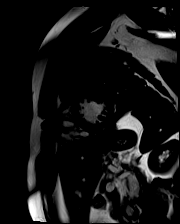

[Series 6: bSSFP · oblique · 8.0mm · 1.61mm/px · 1 of 25 slices shown (16 of 20)]
[im 1/25]
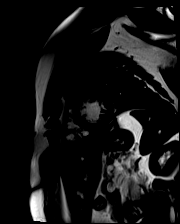

[Series 6: bSSFP · oblique · 8.0mm · 1.61mm/px · 1 of 25 slices shown (17 of 20)]
[im 1/25]
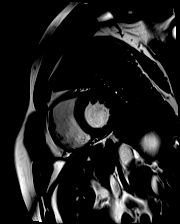

[Series 8: (id)_long_t1_moco · oblique · 8.0mm · 1.41mm/px · 1 of 24 slices shown]
[im 1/24]
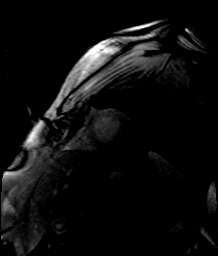

[Series 9: (id)_long_t1_moco_t1 · oblique · 8.0mm · 1.41mm/px · 1 of 6 slices shown]
[im 1/6]
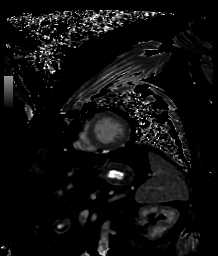

[Series 12: (id)_trufi_moco · oblique · 8.0mm · 1.88mm/px · 1 of 9 slices shown]
[im 1/9]
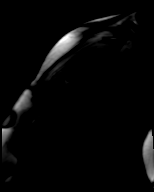

[Series 13: (id)_trufi_moco_t2 · oblique · 8.0mm · 1.88mm/px · 1 of 3 slices shown]
[im 1/3]
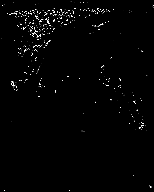

[Series 16: bSSFP · oblique · 7.0mm · 1.41mm/px · 2 of 25 slices shown (18 of 20)]
[im 1/25]
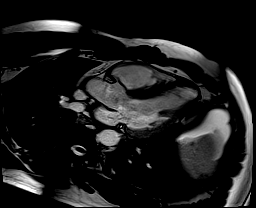
[im 25/25]
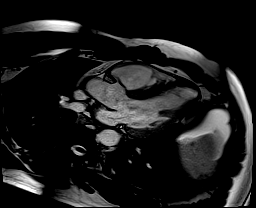

[Series 17: bSSFP · oblique · 7.0mm · 1.41mm/px · 2 of 25 slices shown (19 of 20)]
[im 1/25]
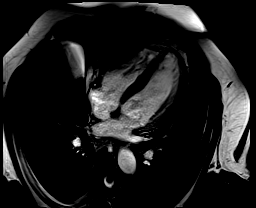
[im 25/25]
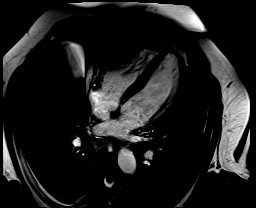

[Series 18: bSSFP · oblique · 7.0mm · 1.41mm/px · 2 of 25 slices shown (20 of 20)]
[im 1/25]
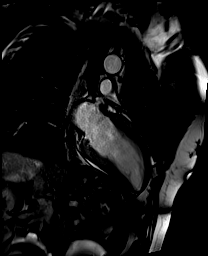
[im 25/25]
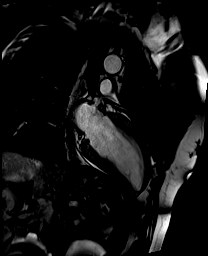

[Series 20: lge_single shot sa · oblique · 8.0mm · 1.98mm/px · 1 of 17 slices shown (1 of 2)]
[im 1/17]
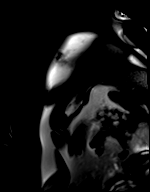

[Series 21: lge_single shot sa · oblique · 8.0mm · 1.98mm/px · 1 of 17 slices shown (2 of 2)]
[im 1/17]
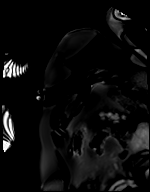

[Series 29: (id)_short_t1_moco · oblique · 8.0mm · 1.41mm/px · 2 of 27 slices shown]
[im 1/27]
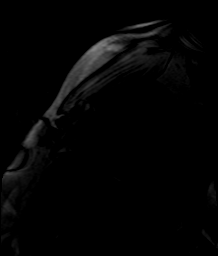
[im 27/27]
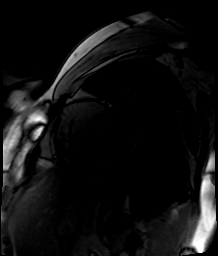

[Series 30: (id)_short_t1_moco_t1 · oblique · 8.0mm · 1.41mm/px · 1 of 6 slices shown]
[im 1/6]
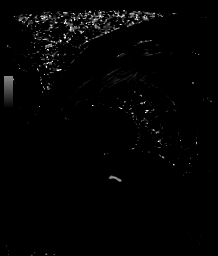

[Series 33: lge short axis_mag · oblique · 8.0mm · 1.61mm/px · 1 of 17 slices shown]
[im 1/17]
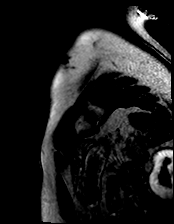

[Series 34: lge short axis_psir · oblique · 8.0mm · 1.61mm/px · 1 of 17 slices shown]
[im 1/17]
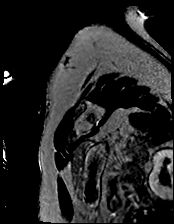

[38 of 40 positions shown; findings below may reference images not displayed]

FINDINGS: Normal cardiac chamber sizes. No pericardial effusion. Normal aortic
root 3.4 cm. Normal TV,MV, AV. Trivial appearing AR and MR. No
ASD/PFO/VSD. Normal LV size and function. Quantitative EF 70% (EDV
135 cc ESV 40 cc SV 95 cc) Normal RV size and function. Delayed
inversion recovery sequences with gadolinium showed no uptake,
infarct infiltration or evidence of sarcoid
IMPRESSION: 1.  Normal LV size and function EF 70%

2. Normal post gadolinium images with no evidence scar, infiltration
or sarcoid

3.  Normal RV size and function

4.  Trivial MR

5.  Trivial AR

6.  No pericardial effusion

Frenci Abi

## 2020-09-28 DIAGNOSIS — G4733 Obstructive sleep apnea (adult) (pediatric): Secondary | ICD-10-CM | POA: Diagnosis not present

## 2020-09-29 ENCOUNTER — Ambulatory Visit: Payer: Medicaid Other | Admitting: Physical Therapy

## 2020-09-30 ENCOUNTER — Encounter: Payer: Self-pay | Admitting: Family Medicine

## 2020-09-30 ENCOUNTER — Ambulatory Visit: Payer: Medicaid Other | Admitting: Physical Therapy

## 2020-09-30 ENCOUNTER — Telehealth: Payer: Self-pay

## 2020-09-30 ENCOUNTER — Encounter: Payer: Self-pay | Admitting: Physical Therapy

## 2020-09-30 ENCOUNTER — Other Ambulatory Visit: Payer: Self-pay

## 2020-09-30 ENCOUNTER — Ambulatory Visit (INDEPENDENT_AMBULATORY_CARE_PROVIDER_SITE_OTHER): Payer: Medicaid Other | Admitting: Family Medicine

## 2020-09-30 VITALS — BP 117/77 | HR 68

## 2020-09-30 VITALS — BP 98/64 | HR 70 | Temp 98.0°F | Ht 74.0 in | Wt 249.0 lb

## 2020-09-30 DIAGNOSIS — M25512 Pain in left shoulder: Secondary | ICD-10-CM | POA: Diagnosis not present

## 2020-09-30 DIAGNOSIS — M545 Low back pain, unspecified: Secondary | ICD-10-CM

## 2020-09-30 DIAGNOSIS — G8929 Other chronic pain: Secondary | ICD-10-CM | POA: Diagnosis not present

## 2020-09-30 DIAGNOSIS — R262 Difficulty in walking, not elsewhere classified: Secondary | ICD-10-CM | POA: Diagnosis not present

## 2020-09-30 DIAGNOSIS — M79671 Pain in right foot: Secondary | ICD-10-CM | POA: Diagnosis not present

## 2020-09-30 DIAGNOSIS — M722 Plantar fascial fibromatosis: Secondary | ICD-10-CM | POA: Diagnosis not present

## 2020-09-30 DIAGNOSIS — M797 Fibromyalgia: Secondary | ICD-10-CM

## 2020-09-30 DIAGNOSIS — M79672 Pain in left foot: Secondary | ICD-10-CM | POA: Diagnosis not present

## 2020-09-30 MED ORDER — AMITRIPTYLINE HCL 50 MG PO TABS: ORAL_TABLET | ORAL | 3 refills | Status: DC

## 2020-09-30 MED ORDER — PREGABALIN 100 MG PO CAPS: 100.0000 mg | ORAL_CAPSULE | Freq: Three times a day (TID) | ORAL | 0 refills | Status: DC

## 2020-09-30 NOTE — Progress Notes (Signed)
Primary Care / Sports Medicine Office Visit  Patient Information:  Patient ID: Luis Mcknight, male DOB: 11/27/1980 Age: 40 y.o. MRN: [id number]   Luis Mcknight is a pleasant 40 y.o. male presenting with the following:  Chief Complaint  Patient presents with   Follow-up   Plantar Fasciitis    Bilateral; pending appeal for bilateral MRI feet; X-Rays 09/23/20; continuing to follow with Physical Therapy and doing home exercises; 6/10 pain   Cervical spondylosis with radiculopathy    Left-sided, following with Physical Therapy   Fibromyalgia    Taking Lyrica 75 mg twice daily with no relief   Shoulder Pain    Following with Physical Therapy; 2/10 pain since PT this morning    Review of Systems pertinent details above   Patient Active Problem List   Diagnosis Date Noted   Metatarsalgia, right foot 08/28/2020   Adjustment reaction with anxiety and depression 08/28/2020   Fibromyalgia 08/28/2020   Plantar fasciitis, right 07/14/2020   Cervical spondylosis with radiculopathy 06/01/2020   Supraspinatus syndrome, 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   Lumbar spondylosis 06/01/2020   Plantar fasciitis, 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   Chest pain of uncertain etiology [id number]   OSA (obstructive sleep apnea) 06/04/2019   Current smoker 06/04/2019   Healthcare maintenance 06/04/2019   Acute pericarditis 04/03/2019   Sarcoidosis 10/16/2018   Lung nodules    Adenopathy    Essential hypertension 11/28/2017   Past Medical History:  Diagnosis Date   Anxiety    Arthritis    Depression    NO MEDS   GERD (gastroesophageal reflux disease)    NO MEDS   Hypertension    OSA on CPAP    Pneumonia    JUNE 2020   Sleep apnea    Outpatient Encounter Medications as of 09/30/2020  Medication Sig   acetaminophen (TYLENOL) 500 MG tablet Take 500-1,000 mg by mouth every 6 (six) hours as needed.   amitriptyline (ELAVIL) 50 MG tablet One half tab PO qHS for a week, then one tab PO qHS.   amLODipine (NORVASC) 10 MG tablet  Take 1 tablet (10 mg total) by mouth every morning. Cardiology   bisoprolol (ZEBETA) 5 MG tablet Take 1 tablet (5 mg total) by mouth daily.   pantoprazole (PROTONIX) 20 MG tablet Take 1 tablet (20 mg total) by mouth daily.   predniSONE (DELTASONE) 10 MG tablet TAKE 1 TABLET(10 MG) BY MOUTH DAILY WITH BREAKFAST   pregabalin (LYRICA) 100 MG capsule Take 1 capsule (100 mg total) by mouth 3 (three) times daily.   triamterene-hydrochlorothiazide (MAXZIDE) 75-50 MG tablet Take 1 tablet by mouth daily.   UNABLE TO FIND CPAP AT NIGHT   [DISCONTINUED] pregabalin (LYRICA) 75 MG capsule Take 1 capsule (75 mg total) by mouth 2 (two) times daily.   No facility-administered encounter medications on file as of 09/30/2020.   Past Surgical History:  Procedure Laterality Date   ELECTROMAGNETIC NAVIGATION BROCHOSCOPY Left 10/08/2018   Procedure: ELECTROMAGNETIC NAVIGATION BRONCHOSCOPY, LEFT;  Surgeon: Tyler Pita, MD;  Location: [facility] ORS;  Service: Cardiopulmonary;  Laterality: Left;   ENDOBRONCHIAL ULTRASOUND Left 10/08/2018   Procedure: ENDOBRONCHIAL ULTRASOUND, LEFT;  Surgeon: Tyler Pita, MD;  Location: [facility] ORS;  Service: Cardiopulmonary;  Laterality: Left;   NO PAST SURGERIES      Vitals:   09/30/20 1131  BP: 98/64  Pulse: 70  Temp: 98 F (36.7 C)  SpO2: 98%   Vitals:   09/30/20 1131  Weight: 249 lb (112.9 kg)  Height: '6\' 2"'$  (  1.88 m)   Body mass index is 31.97 kg/m.     Independent interpretation of notes and tests performed by another provider:   None  Procedures performed:   None  Pertinent History, Exam, Impression, and Recommendations:   Fibromyalgia Persistent symptomatology of this chronic issue, he has yet to establish with behavioral health but has been compliant with medications and physical therapy. I have provided a list of alternate behavioral health providers for him to contact, our office can coordinate referral if needed.  In the interim, I have advised  titration of Lyrica to 100 mg BID x 1 week then TID thereafter. He will also start amitriptyline 25 mg QHS x 1 week then 50 mg QHS. We will coordinate follow-up after MRI studies complete.  Plantar fasciitis, left Bilateral chronic symptomatolgy with persistent exacerbation. Recent x-rays are consistent with diagnosis and we have ordered MRI studies. Medication titration performed, he is to continue with PT and home exercises. We plan to coordinate a follow-up after MRI complete.    Orders & Medications Meds ordered this encounter  Medications   pregabalin (LYRICA) 100 MG capsule    Sig: Take 1 capsule (100 mg total) by mouth 3 (three) times daily.    Dispense:  90 capsule    Refill:  0   amitriptyline (ELAVIL) 50 MG tablet    Sig: One half tab PO qHS for a week, then one tab PO qHS.    Dispense:  90 tablet    Refill:  3   No orders of the defined types were placed in this encounter.    Return if symptoms worsen or fail to improve.     Montel Culver, MD   Primary Care Sports Medicine [place]

## 2020-09-30 NOTE — Patient Instructions (Addendum)
-   Dose Lyrica 100 mg every a.m. and every p.m. x1 week - After 1 week to dose Lyrica 100 mg 3 times daily - Dose 1/2 tablet amitriptyline nightly x1 week - After 1 week dose 1 tablet amitriptyline nightly - Recommend increased water intake - Start daily multivitamin - Use provided information for behavioral health services to find and establish care with a group that is in network - Continue with physical therapy and home exercises - We will coordinate a follow-up after MRI complete - Contact us for any questions

## 2020-09-30 NOTE — Therapy (Signed)
[facility] [address] [place], [place], [address] Phone: [phone]   Fax:  [phone]  Physical Therapy Treatment  Patient Details  Name: Luis Mcknight MRN: [id number] Date of Birth: 04-Sep-1980 Referring Provider (PT): Rosette Reveal   Encounter Date: 09/30/2020   PT End of Session - 09/30/20 0940     Visit Number 9    Number of Visits 12    Date for PT Re-Evaluation 09/16/20    Authorization Type IE 0/07/3974, re-eval/re-cert 7/34/19    Authorization Time Period 5/10-8/8    Authorization - Visit Number 9    Authorization - Number of Visits 10    Progress Note Due on Visit 10    PT Start Time 0850    PT Stop Time 0928    PT Time Calculation (min) 38 min    Activity Tolerance Patient tolerated treatment well    Behavior During Therapy [facility] for tasks assessed/performed             Past Medical History:  Diagnosis Date   Anxiety    Arthritis    Depression    NO MEDS   GERD (gastroesophageal reflux disease)    NO MEDS   Hypertension    OSA on CPAP    Pneumonia    JUNE 2020   Sleep apnea     Past Surgical History:  Procedure Laterality Date   ELECTROMAGNETIC NAVIGATION BROCHOSCOPY Left 10/08/2018   Procedure: ELECTROMAGNETIC NAVIGATION BRONCHOSCOPY, LEFT;  Surgeon: Tyler Pita, MD;  Location: [facility] ORS;  Service: Cardiopulmonary;  Laterality: Left;   ENDOBRONCHIAL ULTRASOUND Left 10/08/2018   Procedure: ENDOBRONCHIAL ULTRASOUND, LEFT;  Surgeon: Tyler Pita, MD;  Location: [facility] OR[facility];  Service: Cardiopulmonary;  Laterality: Left;   NO PAST SURGERIES      Vitals:   09/30/20 0901  BP: 117/77  Pulse: 68     Subjective Assessment - 09/30/20 0855     Subjective Patient reports his blood pressure was up yesterday in the AM, and then dropped down. He states he has not yet followed up with physician regarding blood pressure management. He reports some pain affecting L forearm this AM, but he denies  notable pain affecting L shoulder today. Patient is following up with Rosette Reveal, MD today to discuss management of bilateral foot pain and will be obtaining further imaging.    Pertinent History Patient has had both issues for > 1 year. 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 Patient worked 12 hour shifts and was on his feet throughout that time which was when his B heel pain was worsened. Patient notes back pain radiates around B flanks. Patient sleeps on his stomach which is his only comfortable position as laying on his side exacerbates his neck and L shoulder/UE pain.    Limitations Lifting;Standing;House hold activities;Walking    How long can you stand comfortably? 15 minutes    How long can you walk comfortably? "doesn't bother me as much" in regard to foot pain    Currently in Pain? Yes    Pain Score 4     Pain Onset More than a month ago    Pain Onset More than a month ago               TREATMENT   Manual Therapy - for symptom modulation, soft tissue sensitivity and mobility, joint mobility, ROM   Manual cervical traction in supine, intermittent STM/DTM L  UT, L LS, L pectoralis major Manual median and ulnar nerve glides with ipsilateral cervical sidebend     Therapeutic Activities - patient education and HEP establishment  Ulnar nerve flossing; 1x10 [minimal verbal cueing and demonstration] Median nerve flossing; 1x10 [moderate verbal cueing and demonstration] Pectoralis stretch in doorway (90 deg abduction); 3x30sec   Patient education: Discussed patient's current strategies for blood pressure control and encouraged follow-up with his physician regarding medical management of hypertension. HEP update and review (see Access Code)   *not today* Seated Upper Trapezius stretch; 2x30sec Seated Levator Scapulae stretch; 1x30sec Cervical retraction; 1x10; supine      Patient Response to  interventions: Diminished LUE referred symptoms with use of manual cervical traction and fleeting concordant sign with tension test of ulnar and median nerves     ASSESSMENT Patient has good response to distraction with decreased LUE referred pain. Patient has good blood pressure this AM and has minimal left upper quarter symptoms. Recommendation for discussion with MD regarding difficulty with blood pressure control was made again this morning. Patient is doing well with his current home exercise plan; only update today was for further work on neurodynamics with median nerve bias. He has current remaining deficits in L shoulder AROM and pain with cervical extension and L lateral flexion, postural changes, neural tension affecting ulnar and median nerve on left, and decreased strength. Patient will benefit from continued skilled therapeutic intervention to address the above impairments and activity limitations for best return to PLOF and for improved QoL.       PT Long Term Goals - 09/16/20 2226       PT LONG TERM GOAL #1   Title Patient will be independent with HEP in order to decrease ankle pain and increase strength in order to improve pain-free function at home and work.    Baseline 08/18/20: Some difficulty with HEP recollection; 09/15/20: Patient is compliant    Time 6    Period Weeks    Status Partially Met    Target Date 09/01/20      PT LONG TERM GOAL #2   Title Patient will demonstrate improved function as evidenced by a score of 48 on FOTO measure for full participation in activities at home and in the community.    Baseline IE: 41, no change noted at this time. 09/15/20: 42    Time 6    Period Weeks    Status Not Met    Target Date 10/16/20      PT LONG TERM GOAL #3   Title Patient will decrease worst pain as reported on NPRS by at least 2 points to demonstrate clinically significant reduction in pain in order to restore/improve function and overall QOL.    Baseline IE: 8/10 B  heels. 08/18/20: up to 8/10 plantar foot pain bilat    Time 6    Period Weeks    Status Not Met      PT LONG TERM GOAL #4   Title Patient will report being able to return to activities including, but not limited to: standing and walking for > 2 hours with pain controlled and minimal limitation to indicate complete resolution of the chief complaint and return to prior level of participation at home and in the community.    Baseline IE: standing < 5 min, walking 30 min. 08/18/20: standing 15 minutes, walking without notable duration limitation    Time 6    Period Weeks    Status Partially Met  PT LONG TERM GOAL #5   Title Patient will demonstrate understanding of basic self-management/down-regulation of the nervous system for persistent pain condition and stress as evidenced by diaphragmatic breathing without cueing, body scan/progressive relaxation meditation, and improved sleep hygiene in order to transition to independent management of patient's chief complaint:persistent pain in multiple regions of the body.    Baseline Ie: not demonstrated; 08/18/20: partial understanding of pain science concepts    Time 6    Period Weeks    Status Partially Met      PT LONG TERM GOAL #6   Title Patient will be independent and compliant with advaned HEP as needed for self-management of condition and improved self-care, performance of household tasks, and sleep hygiene    Baseline 09/15/20: New HEP initiated    Time 3    Period Weeks    Status New    Target Date 10/07/20                   Plan - 10/01/20 4332     Clinical Impression Statement Patient has good response to distraction with decreased LUE referred pain. Patient has good blood pressure this AM and has minimal left upper quarter symptoms. Recommendation for discussion with MD regarding difficulty with blood pressure control was made again this morning. Patient is doing well with his current home exercise plan; only update today  was for further work on neurodynamics with median nerve bias. He has current remaining deficits in L shoulder AROM and pain with cervical extension and L lateral flexion, postural changes, neural tension affecting ulnar and median nerve on left, and decreased strength. Patient will benefit from continued skilled therapeutic intervention to address the above impairments and activity limitations for best return to PLOF and for improved QoL.    Personal Factors and Comorbidities Age;Behavior Pattern;Comorbidity 3+;Past/Current Experience;Time since onset of injury/illness/exacerbation;Profession    Comorbidities anxiety, arthritis, depression, GERD, OSA    Examination-Activity Limitations Bed Mobility;Sleep;Sit;Squat;Stairs;Stand;Locomotion Level;Lift    Examination-Participation Restrictions Occupation;Cleaning;Yard Work;Community Activity;Shop    Stability/Clinical Decision Making Evolving/Moderate complexity    Rehab Potential Fair    PT Frequency 2x / week    PT Duration 6 weeks    PT Treatment/Interventions Taping;Dry needling;Joint Manipulations;Spinal Manipulations;Manual techniques;Patient/family education;Neuromuscular re-education;Therapeutic exercise;Moist Heat;Cryotherapy;Electrical Stimulation    PT Next Visit Plan Manual therapy and traction for symptom modulation, postural re-edu, specific soft tissue stretching, neurodynamics; update HEP with successive PT visits    PT Home Exercise Plan HEP Access Code [email or web address]    Consulted and Agree with Plan of Care Patient             Patient will benefit from skilled therapeutic intervention in order to improve the following deficits and impairments:  Pain, Improper body mechanics, Difficulty walking, Hypomobility, Decreased activity tolerance  Visit Diagnosis: Chronic left shoulder pain  Low back pain, unspecified back pain laterality, unspecified chronicity, unspecified whether sciatica present  Bilateral foot pain  Difficulty in  walking, not elsewhere classified     Problem List Patient Active Problem List   Diagnosis Date Noted   Metatarsalgia, right foot 08/28/2020   Adjustment reaction with anxiety and depression 08/28/2020   Fibromyalgia 08/28/2020   Plantar fasciitis, right 07/14/2020   Cervical spondylosis with radiculopathy 06/01/2020   Supraspinatus syndrome, 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   Lumbar spondylosis 06/01/2020   Plantar fasciitis, 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   Chest pain of uncertain etiology 60/63/0160   OSA (obstructive sleep apnea) 06/04/2019   Current smoker 06/04/2019   Healthcare maintenance 06/04/2019  Acute pericarditis 04/03/2019   Sarcoidosis 10/16/2018   Lung nodules    Adenopathy    Essential hypertension 11/28/2017   Valentina Gu, PT, DPT [phone] Eilleen Kempf 10/01/2020, 7:23 AM  [facility] [address] [place], [place], [address] Phone: [phone]   Fax:  [phone]  Name: Luis Mcknight MRN: [id number] Date of Birth: 1980/09/04

## 2020-09-30 NOTE — Assessment & Plan Note (Signed)
Bilateral chronic symptomatolgy with persistent exacerbation. Recent x-rays are consistent with diagnosis and we have ordered MRI studies. Medication titration performed, he is to continue with PT and home exercises. We plan to coordinate a follow-up after MRI complete.

## 2020-09-30 NOTE — Assessment & Plan Note (Signed)
Persistent symptomatology of this chronic issue, he has yet to establish with behavioral health but has been compliant with medications and physical therapy. I have provided a list of alternate behavioral health providers for him to contact, our office can coordinate referral if needed.  In the interim, I have advised titration of Lyrica to 100 mg BID x 1 week then TID thereafter. He will also start amitriptyline 25 mg QHS x 1 week then 50 mg QHS. We will coordinate follow-up after MRI studies complete.

## 2020-09-30 NOTE — Telephone Encounter (Signed)
Spoke with Kendal earlier this morning.  PA appeal for bilateral MRI feet faxed to [facility] with all notes and imaging from PT, cardiology, and Dr. Zigmund Daniel with attention to the [facility].  Pending response.  For your information.   Reference #: [id number]

## 2020-10-02 DIAGNOSIS — F331 Major depressive disorder, recurrent, moderate: Secondary | ICD-10-CM | POA: Diagnosis not present

## 2020-10-02 DIAGNOSIS — Z79899 Other long term (current) drug therapy: Secondary | ICD-10-CM | POA: Diagnosis not present

## 2020-10-02 DIAGNOSIS — Z1389 Encounter for screening for other disorder: Secondary | ICD-10-CM | POA: Diagnosis not present

## 2020-10-05 DIAGNOSIS — G4733 Obstructive sleep apnea (adult) (pediatric): Secondary | ICD-10-CM | POA: Diagnosis not present

## 2020-10-06 ENCOUNTER — Ambulatory Visit: Payer: Medicaid Other | Admitting: Physical Therapy

## 2020-10-12 ENCOUNTER — Encounter: Payer: Self-pay | Admitting: Physical Therapy

## 2020-10-12 ENCOUNTER — Ambulatory Visit: Payer: Medicaid Other | Attending: Family Medicine | Admitting: Physical Therapy

## 2020-10-12 ENCOUNTER — Other Ambulatory Visit: Payer: Self-pay

## 2020-10-12 DIAGNOSIS — M79671 Pain in right foot: Secondary | ICD-10-CM | POA: Diagnosis not present

## 2020-10-12 DIAGNOSIS — R262 Difficulty in walking, not elsewhere classified: Secondary | ICD-10-CM | POA: Insufficient documentation

## 2020-10-12 DIAGNOSIS — M545 Low back pain, unspecified: Secondary | ICD-10-CM | POA: Insufficient documentation

## 2020-10-12 DIAGNOSIS — G8929 Other chronic pain: Secondary | ICD-10-CM | POA: Insufficient documentation

## 2020-10-12 DIAGNOSIS — M25512 Pain in left shoulder: Secondary | ICD-10-CM | POA: Diagnosis not present

## 2020-10-12 DIAGNOSIS — M79672 Pain in left foot: Secondary | ICD-10-CM | POA: Diagnosis not present

## 2020-10-12 NOTE — Therapy (Signed)
[facility] [address]. [place], [place], [address] Phone: [phone]   Fax:  [phone]  Physical Therapy Treatment/ Physical Therapy Progress Note/Re-certification   Dates of reporting period  09/15/20   to   10/12/20   Patient Details  Name: Luis Mcknight MRN: [id number] Date of Birth: 06/11/1980 Referring Provider (PT): Rosette Reveal   Encounter Date: 10/12/2020   PT End of Session - 10/12/20 1547     Visit Number 10    Number of Visits 12    Date for PT Re-Evaluation 09/16/20    Authorization Type IE 07/14/2922, re-eval/re-cert 4/62/86    Authorization Time Period 5/10-8/8    Authorization - Visit Number 10    Authorization - Number of Visits 10    Progress Note Due on Visit 10    PT Start Time 3817    PT Stop Time 1627    PT Time Calculation (min) 42 min    Activity Tolerance Patient tolerated treatment well    Behavior During Therapy [facility] for tasks assessed/performed             Past Medical History:  Diagnosis Date   Anxiety    Arthritis    Depression    NO MEDS   GERD (gastroesophageal reflux disease)    NO MEDS   Hypertension    OSA on CPAP    Pneumonia    JUNE 2020   Sleep apnea     Past Surgical History:  Procedure Laterality Date   ELECTROMAGNETIC NAVIGATION BROCHOSCOPY Left 10/08/2018   Procedure: ELECTROMAGNETIC NAVIGATION BRONCHOSCOPY, LEFT;  Surgeon: Tyler Pita, MD;  Location: [facility] ORS;  Service: Cardiopulmonary;  Laterality: Left;   ENDOBRONCHIAL ULTRASOUND Left 10/08/2018   Procedure: ENDOBRONCHIAL ULTRASOUND, LEFT;  Surgeon: Tyler Pita, MD;  Location: [facility] OR[facility];  Service: Cardiopulmonary;  Laterality: Left;   NO PAST SURGERIES      There were no vitals filed for this visit.   Subjective Assessment - 10/12/20 1547     Subjective Patient's insurance has not yet approved MRI for his foot. He feels that exercises/PT are helping with his shoulder pain. Patient reports feeling  fatigue and generalized weakness recently. Patient reports that he is working on his [employer], political party. He reports improvement in his foot pain with his exercises. He states that increase in Lyrica is helping notably with his foot pain, though it is "not perfect." Patient reports feeling dizzy/lightheaded recently. Pt reports good BP readings recently. Patient reports mild HA at arrival, but no dizziness. Patient reports 50% global improvement at this time. He reports some pain in his low back yesterday, but no feeling it today. Pt reports pain up to 4/10 at the highest recently.    Pertinent History Patient has had both issues for > 1 year. 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 Patient worked 12 hour shifts and was on his feet throughout that time which was when his B heel pain was worsened. Patient notes back pain radiates around B flanks. Patient sleeps on his stomach which is his only comfortable position as laying on his side exacerbates his neck and L shoulder/UE pain.    Limitations Lifting;Standing;House hold activities;Walking    How long can you stand comfortably? 15-20 minutes    How long can you walk comfortably? "doesn't bother me as much" in regard to foot pain    Currently in Pain? Yes    Pain Score  3     Pain Onset More than a month ago    Pain Onset More than a month ago              PT Education - 10/12/20 1752     Education Details Discussed at length with the patient the current PT plan of care, focusing on R knee complaints once good program for upper quarter impairments is established and upper quarter pain is well-controlled, and continued plan of care to establish full home-based program for patient. Reviewed concepts of pain science, sleep hygiene, importance of addressing contextual factors related to pain, and use of light aerobic work to decrease central/peripheral sensitization.    Person(s) Educated Patient     Methods Explanation    Comprehension Verbalized understanding             OBJECTIVE     Cervical Screen AROM:  Flexion: WNL Extension: 50 Lateral flexion: R 48, L 48 Cervical rotation: R 80, L 74* (mild pull with L rotation)  Palpation Tenderness to palpation along L UT (lateral substance), suboccipital mm     Strength R/L 5/4 Shoulder flexion (anterior deltoid/pec major/coracobrachialis, axillary n. (C5-6) and musculocutaneous n. (C5-7)) 4+/4+ Shoulder abduction (deltoid/supraspinatus, axillary/suprascapular n, C5) 5/4+ Shoulder external rotation (infraspinatus/teres minor) 5/4+ Shoulder internal rotation (subcapularis/lats/pec major) 5/5 Shoulder extension (posterior deltoid, lats, teres major, axillary/thoracodorsal n.) 5/5 Shoulder horizontal abduction 5/5 Elbow flexion (biceps brachii, brachialis, brachioradialis, musculoskeletal n, C5-6) 5/4+ Elbow extension (triceps, radial n, C7) 5/4- Wrist Extension 5/4- Wrist Flexion 5/5 Finger adduction (interossei, ulnar n, T1)   AROM R/L 152/154 Shoulder flexion 173/154 Shoulder abduction WFL/WFL Shoulder external rotation *Indicates pain, overpressure performed unless otherwise indicated   PROM R/L WNL/WNL Shoulder flexion WNL/WNL Shoulder abduction WNL/WNL Shoulder external rotation 70/70 Shoulder internal rotation *Indicates pain, overpressure performed unless otherwise indicated         TREATMENT   Manual Therapy - for symptom modulation, soft tissue sensitivity and mobility, joint mobility, ROM   Manual cervical traction in supine, intermittent STM/DTM L UT, L LS, L pectoralis major Upper cervical distraction, suboccipital release  *not today* Manual median and ulnar nerve glides with ipsilateral cervical sidebend     Therapeutic Activities - patient education and HEP establishment  -Re-assessment performed (see above) -Patient education (see above)    *not today* Ulnar nerve flossing; 1x10  [minimal verbal cueing and demonstration] Median nerve flossing; 1x10 [moderate verbal cueing and demonstration] Pectoralis stretch in doorway (90 deg abduction); 3x30sec  Seated Upper Trapezius stretch; 2x30sec Seated Levator Scapulae stretch; 1x30sec Cervical retraction; 1x10; supine         ASSESSMENT Patient exhibits normal pain-free shoulder PROM, functional shoulder AROM, good cervical spine AROM with only mild discomfort with end-range L rotation, and notably improved comorbid conditions including bilateral foot pain. He reports significant subjective improvement with upper quarter complaints. He does have increase in fatigue and drowsiness with his current medication regimen, though feeling fatigued and weak can also be attributed to fibromyalgia. Patient has continued with established home exercises to address impairments related to low back pain and plantar foot pain. Patient is undergoing workup with behavioral health to address contextual/stress-related factors that can contribute significantly to fibromyalgia. He has met long-term FOTO goal and numeric pain rating scale goal. His condition is complicated by notable psychosocial contributors and various musculoskeletal concerns. He has ongoing R knee pain that may need to be focus of PT as upper quarter complaints are well-managed and full home program is established.  Patient will benefit from continued skilled therapeutic intervention to address his remaining impairments and activity limitations for best return to PLOF and for improved QoL.       PT Long Term Goals - 10/12/20 1545       PT LONG TERM GOAL #1   Title Patient will be independent with HEP in order to decrease ankle pain and increase strength in order to improve pain-free function at home and work.    Baseline 08/18/20: Some difficulty with HEP recollection; 09/15/20: Patient is compliant. 10/12/20: Pt is compliant with HEP    Time 6    Period Weeks    Status Achieved     Target Date 10/12/20      PT LONG TERM GOAL #2   Title Patient will demonstrate improved function as evidenced by a score of 48 on FOTO measure for full participation in activities at home and in the community.    Baseline IE: 41, no change noted at this time. 09/15/20: 42. 10/12/20: 48    Time 6    Period Weeks    Status Achieved    Target Date 10/16/20      PT LONG TERM GOAL #3   Title Patient will decrease worst pain as reported on NPRS by at least 2 points to demonstrate clinically significant reduction in pain in order to restore/improve function and overall QOL.    Baseline IE: 8/10 B heels. 08/18/20: up to 8/10 plantar foot pain bilat. 10/12/20: pain up to 4/10 at highest.    Time 6    Period Weeks    Status Achieved    Target Date 10/12/20      PT LONG TERM GOAL #4   Title Patient will report being able to return to activities including, but not limited to: standing and walking for > 2 hours with pain controlled and minimal limitation to indicate complete resolution of the chief complaint and return to prior level of participation at home and in the community.    Baseline IE: standing < 5 min, walking 30 min. 08/18/20: standing 15 minutes, walking without notable duration limitation. 10/12/20: stand 15-20 minutes, walking withot notable duratoin limitation    Time 6    Period Weeks    Status Partially Met    Target Date 11/02/20      PT LONG TERM GOAL #5   Title Patient will demonstrate understanding of basic self-management/down-regulation of the nervous system for persistent pain condition and stress as evidenced by diaphragmatic breathing without cueing, body scan/progressive relaxation meditation, and improved sleep hygiene in order to transition to independent management of patient's chief complaint:persistent pain in multiple regions of the body.    Baseline Ie: not demonstrated; 08/18/20: partial understanding of pain science concepts. 10/12/20: patient verbalizes good understanding  of sleep hygiene conceptsand and contribution of stress and contextual factors for persistent pain.    Time 6    Period Weeks    Status Partially Met      PT LONG TERM GOAL #6   Title Patient will be independent and compliant with advanced HEP as needed for self-management of condition and improved self-care, performance of household tasks, and sleep hygiene    Baseline 09/15/20: New HEP initiated. 10/12/20: Patient is compliant with current advanced HEP, plan to continue advancement to address various musculoskeletal concerns    Time 3    Period Weeks    Status Partially Met    Target Date 11/09/20  Plan - 10/12/20 1750     Clinical Impression Statement Patient exhibits normal pain-free shoulder PROM, functional shoulder AROM, good cervical spine AROM with only mild discomfort with end-range L rotation, and notably improved comorbid conditions including bilateral foot pain. He reports significant subjective improvement with upper quarter complaints. He does have increase in fatigue and drowsiness with his current medication regimen, though feeling fatigued and weak can also be attributed to fibromyalgia. Patient has continued with established home exercises to address impairments related to low back pain and plantar foot pain. Patient is undergoing workup with behavioral health to address contextual/stress-related factors that can contribute significantly to fibromyalgia. He has met long-term FOTO goal and numeric pain rating scale goal. His condition is complicated by notable psychosocial contributors and various musculoskeletal concerns. He has ongoing R knee pain that may need to be focus of PT as upper quarter complaints are well-managed and full home program is established. Patient will benefit from continued skilled therapeutic intervention to address his remaining impairments and activity limitations for best return to PLOF and for improved QoL.    Personal Factors  and Comorbidities Age;Behavior Pattern;Comorbidity 3+;Past/Current Experience;Time since onset of injury/illness/exacerbation;Profession    Comorbidities anxiety, arthritis, depression, GERD, OSA    Examination-Activity Limitations Bed Mobility;Sleep;Sit;Squat;Stairs;Stand;Locomotion Level;Lift    Examination-Participation Restrictions Occupation;Cleaning;Yard Work;Community Activity;Shop    Stability/Clinical Decision Making Evolving/Moderate complexity    Rehab Potential Fair    PT Frequency 2x / week    PT Duration 6 weeks    PT Treatment/Interventions Taping;Dry needling;Joint Manipulations;Spinal Manipulations;Manual techniques;Patient/family education;Neuromuscular re-education;Therapeutic exercise;Moist Heat;Cryotherapy;Electrical Stimulation    PT Next Visit Plan Manual therapy and traction for symptom modulation, postural re-edu, specific soft tissue stretching, neurodynamics; update HEP with successive PT visits. Transition to R knee as able.    PT Home Exercise Plan HEP Access Code [email or web address]    Consulted and Agree with Plan of Care Patient             Patient will benefit from skilled therapeutic intervention in order to improve the following deficits and impairments:  Pain, Improper body mechanics, Difficulty walking, Hypomobility, Decreased activity tolerance  Visit Diagnosis: Chronic left shoulder pain  Low back pain, unspecified back pain laterality, unspecified chronicity, unspecified whether sciatica present  Bilateral foot pain  Difficulty in walking, not elsewhere classified     Problem List Patient Active Problem List   Diagnosis Date Noted   Metatarsalgia, right foot 08/28/2020   Adjustment reaction with anxiety and depression 08/28/2020   Fibromyalgia 08/28/2020   Plantar fasciitis, right 07/14/2020   Cervical spondylosis with radiculopathy 06/01/2020   Supraspinatus syndrome, 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   Lumbar spondylosis 06/01/2020   Plantar fasciitis, left  06/01/2020   Chest pain of uncertain etiology 27/74/1287   OSA (obstructive sleep apnea) 06/04/2019   Current smoker 06/04/2019   Healthcare maintenance 06/04/2019   Acute pericarditis 04/03/2019   Sarcoidosis 10/16/2018   Lung nodules    Adenopathy    Essential hypertension 11/28/2017   Valentina Gu, PT, DPT #[id number]  Eilleen Kempf 10/13/2020, 7:17 AM  [facility] [address] [place], [place], [address] Phone: [phone]   Fax:  [phone]  Name: Loranzo Desha MRN: [id number] Date of Birth: Jun 09, 1980

## 2020-10-13 ENCOUNTER — Encounter: Payer: Medicaid Other | Admitting: Physical Therapy

## 2020-10-14 ENCOUNTER — Telehealth: Payer: Self-pay | Admitting: Family Medicine

## 2020-10-14 NOTE — Telephone Encounter (Signed)
Referral Request - Has patient seen PCP for this complaint? yes *If NO, is insurance requiring patient see PCP for this issue before PCP can refer them? Referral for which specialty:psychiatrist Preferred provider/office: N/A Reason for referral:mental health

## 2020-10-16 ENCOUNTER — Other Ambulatory Visit: Payer: Self-pay

## 2020-10-16 NOTE — Telephone Encounter (Signed)
Called pt let him know that it was placed on 6/24. Pt verbalized understanding.  KP

## 2020-10-16 NOTE — Telephone Encounter (Signed)
I did not place a referral due to it being already placed on 08/28/20.  KP

## 2020-10-16 NOTE — Telephone Encounter (Signed)
Pt stated that he received a list of places to go from our office for places to go for mental health. Pt stated that he went to see one of the providers listed. He informed me that he got a bill from there. Pt stated that they told him that he needed a referral so it could be covered. Pt has upcoming appts with them. I placed a referral for [facility] in [place].  KP

## 2020-10-20 ENCOUNTER — Encounter: Payer: Medicaid Other | Admitting: Physical Therapy

## 2020-10-22 ENCOUNTER — Ambulatory Visit: Payer: Medicaid Other | Admitting: Physical Therapy

## 2020-10-27 ENCOUNTER — Telehealth: Payer: Self-pay

## 2020-10-27 ENCOUNTER — Other Ambulatory Visit: Payer: Self-pay

## 2020-10-27 ENCOUNTER — Ambulatory Visit: Payer: Medicaid Other | Admitting: Physical Therapy

## 2020-10-27 DIAGNOSIS — M545 Low back pain, unspecified: Secondary | ICD-10-CM | POA: Diagnosis not present

## 2020-10-27 DIAGNOSIS — M79672 Pain in left foot: Secondary | ICD-10-CM | POA: Diagnosis not present

## 2020-10-27 DIAGNOSIS — R262 Difficulty in walking, not elsewhere classified: Secondary | ICD-10-CM

## 2020-10-27 DIAGNOSIS — G8929 Other chronic pain: Secondary | ICD-10-CM | POA: Diagnosis not present

## 2020-10-27 DIAGNOSIS — M79671 Pain in right foot: Secondary | ICD-10-CM | POA: Diagnosis not present

## 2020-10-27 DIAGNOSIS — M25512 Pain in left shoulder: Secondary | ICD-10-CM | POA: Diagnosis not present

## 2020-10-27 NOTE — Telephone Encounter (Signed)
Copied from [place] [phone]. Topic: General - Inquiry >> Oct 27, 2020  8:06 AM Scherrie Gerlach wrote: Reason for CRM: [facility] called about the appeal for the pt.  She asked that you be on the lookout for the AOR form she is going to fax this am

## 2020-10-27 NOTE — Therapy (Signed)
[facility] [address] [place], [place], [address] Phone: [phone]   Fax:  [phone]  Physical Therapy Treatment  Patient Details  Name: Luis Mcknight MRN: [id number] Date of Birth: 10/19/1980 Referring Provider (PT): Matthews, Jason   Encounter Date: 10/27/2020   PT End of Session - 10/28/20 1804     Visit Number 11    Number of Visits 12    Date for PT Re-Evaluation 09/16/20    Authorization Type IE 07/07/2020, re-eval/re-cert 09/15/20, re-cert 10/12/20-11/20/20    Authorization Time Period 8/18-9/1    Authorization - Visit Number 1    Authorization - Number of Visits 2    Progress Note Due on Visit 2    PT Start Time 1300    PT Stop Time 1345    PT Time Calculation (min) 45 min    Activity Tolerance Patient tolerated treatment well    Behavior During Therapy WFL for tasks assessed/performed             Past Medical History:  Diagnosis Date   Anxiety    Arthritis    Depression    NO MEDS   GERD (gastroesophageal reflux disease)    NO MEDS   Hypertension    OSA on CPAP    Pneumonia    JUNE 2020   Sleep apnea     Past Surgical History:  Procedure Laterality Date   ELECTROMAGNETIC NAVIGATION BROCHOSCOPY Left 10/08/2018   Procedure: ELECTROMAGNETIC NAVIGATION BRONCHOSCOPY, LEFT;  Surgeon: Gonzalez, Carmen L, MD;  Location: [facility];  Service: Cardiopulmonary;  Laterality: Left;   ENDOBRONCHIAL ULTRASOUND Left 10/08/2018   Procedure: ENDOBRONCHIAL ULTRASOUND, LEFT;  Surgeon: Gonzalez, Carmen L, MD;  Location: [facility];  Service: Cardiopulmonary;  Laterality: Left;   NO PAST SURGERIES      There were no vitals filed for this visit.   Subjective Assessment - 10/27/20 1301     Subjective Patient reports feeling drained recently and he thought it would have been COVID. (-) COVID test. He states that he was informed that this can be due to antidepressant medication. He reports he was having issues with his feet  and knee over the weekend. He states it is "not too bad" at arrival. 2-3/10 pain at arrival to PT. Patient reports he is continuing to work on graded loading for plantar foot pain. He reports some difficulty with compliance with stationary cycling due to knee pain. He reports some atrophy in his lower limbs. Pt denies recent bowel/bladder changes. He reports sleep disturbance secondary to sleep apnea - not using CPAP machine recently. He reports intermittent dizziness associated with change in position - going to stand up, but not with rolling in bed. Pt denies chills. Pt reports not having "many problems" with upper quarter complaints. Patient is awaiting MRI for bilateral feet. Patient reports ongoing R knee pain; he states he is trying to wean from his knee brace. He reports vague peripatellar pain. Patient reports his R knee feels like it may give out sometimes. He reports episode of significant valgus when going to sit on toilet when his knee gave way with significant pain. Patient reports no history of significant knee trauma. Patient reports no major knee injuries prior to this. R knee is aggravated by: walking, weightbearing, going down steps. R knee feels better with R lower limb in reclined position, massage. Radiographs in August 2020 demonstrating small knee effusion, no major findings otherwise. He reports some medial-sided catching in his R knee,   sensation of "rubbing."    Pertinent History Patient has had both issues for > 1 year. 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 Patient worked 12 hour shifts and was on his feet throughout that time which was when his B heel pain was worsened. Patient notes back pain radiates around B flanks. Patient sleeps on his stomach which is his only comfortable position as laying on his side exacerbates his neck and L shoulder/UE pain.    Limitations Lifting;Standing;House hold activities;Walking    How long can  you stand comfortably? 15-20 minutes    How long can you walk comfortably? "doesn't bother me as much" in regard to foot pain    Currently in Pain? Yes    Pain Score 3     Pain Onset More than a month ago    Pain Onset More than a month ago              OBJECTIVE EXAM   Observation Squat: mild pelvic weight shift to R side. No gross edema, erythema, or ecchymosis noted.   Gait No gross deficits in gait identified  Palpation Tenderness to palpation along R medial joint line  Strength R/L 5/5 Hip flexion 4+/5 Hip external rotation 4/5 Hip internal rotation 5/5 Hip extension  5/5 Hip abduction 5/5 Hip adduction 4/5- Knee extension 5/5 Knee flexion 5/5 Ankle Dorsiflexion 5/5 Ankle Plantarflexion 5/5 Ankle Inversion 5/5 Ankle Eversion *indicates pain  AROM  Knee R/L Flexion: 133/132 Extension: +8/+5 *indicates pain  Knee PROM WNL, no pain with extension overpressure; pain with flexion overpressure   Hip R/L Flexion: R WNL, L WNL  Extension: R not tested, L not tested Abduction: R WNL, L WNL Adduction: R not tested, L not tested Internal Rotation: R 30* (pain in groin), L 30 External Rotation: R WNL, L WNL *indicates pain  Ankle R/L 50/50 Ankle Plantarflexion 20/20 Ankle Dorsiflexion 35/35 Ankle Inversion 25/25 Ankle Eversion *Indicates Pain  Muscle Length Hamstring length: R WFL, L WFL Quad length (Ely): R 75%, L 75%   Passive Accessory Motion Superior Tibiofibular Joint: WNL Patella: Mild inferior glide deficit, otherwise WNL     SPECIAL TESTS  Patellar Grind: R Negative, L Not tested  Ligamentous Stability  Anterior Drawer Test: Not done Lachman Test: Not done Posterior Drawer Test: Not done Posterior Sag Sign: Not done Valgus Stress Test: Negative Varus Stress Test: Negative  Meniscus Tests  McMurray Test: R Positive, L Not tested         TREATMENT   Therapeutic Exercise - HEP establishment, specific strengthening  for improved ability to perform closed-chain/weight  Prone Quadriceps stretch; 3x30sec Sidelying hip abduction; x10 Minisquat; x10  Patient education: overview of existing HEP and provision of new handout for today's session     Therapeutic Activities - patient education and HEP establishment   Examination for R knee pain performed (see above) Patient education (see above)     *not today* Ulnar nerve flossing; 1x10 [minimal verbal cueing and demonstration] Median nerve flossing; 1x10 [moderate verbal cueing and demonstration] Pectoralis stretch in doorway (90 deg abduction); 3x30sec   Seated Upper Trapezius stretch; 2x30sec Seated Levator Scapulae stretch; 1x30sec Cervical retraction; 1x10; supine           ASSESSMENT Patient has progressed fairly well in spite of several body regions associated with pain and activity limitation along with significant psychosocial factors for which patient is following up with behavioral health. His upper quarter pain   is well under control at this time and pt is continuing with established exercises. Completed assessment for complaint of R knee pain today. Patient has 3/5 signs of meniscus pathology per established clinical predication rule. No history of catching or locking. Low index of suspicion for significant internal derangement or ligamentous instability given atraumatic onset and the findings above. (-) provocative testing and subjective findings for PFPS. Updated home program today to address impairments related to knee pain. Pt will need further therapy to ensure he has a robust HEP to maintain progress with therapy and continue to downregulate nervous system given context of generalized pain disorder/fibromyalgia. His condition is complicated by notable psychosocial contributors and various musculoskeletal concerns. Patient will benefit from continued skilled therapeutic intervention to address his remaining impairments and activity  limitations for best return to PLOF and for improved QoL.     PT Education - 10/28/20 1833     Education Details Patient educated on current condition, continued role of PT, prognosis, plan of care    Person(s) Educated Patient    Methods Explanation    Comprehension Verbalized understanding                 PT Long Term Goals - 10/12/20 1545       PT LONG TERM GOAL #1   Title Patient will be independent with HEP in order to decrease ankle pain and increase strength in order to improve pain-free function at home and work.    Baseline 08/18/20: Some difficulty with HEP recollection; 09/15/20: Patient is compliant. 10/12/20: Pt is compliant with HEP    Time 6    Period Weeks    Status Achieved    Target Date 10/12/20      PT LONG TERM GOAL #2   Title Patient will demonstrate improved function as evidenced by a score of 48 on FOTO measure for full participation in activities at home and in the community.    Baseline IE: 41, no change noted at this time. 09/15/20: 42. 10/12/20: 48    Time 6    Period Weeks    Status Achieved    Target Date 10/16/20      PT LONG TERM GOAL #3   Title Patient will decrease worst pain as reported on NPRS by at least 2 points to demonstrate clinically significant reduction in pain in order to restore/improve function and overall QOL.    Baseline IE: 8/10 B heels. 08/18/20: up to 8/10 plantar foot pain bilat. 10/12/20: pain up to 4/10 at highest.    Time 6    Period Weeks    Status Achieved    Target Date 10/12/20      PT LONG TERM GOAL #4   Title Patient will report being able to return to activities including, but not limited to: standing and walking for > 2 hours with pain controlled and minimal limitation to indicate complete resolution of the chief complaint and return to prior level of participation at home and in the community.    Baseline IE: standing < 5 min, walking 30 min. 08/18/20: standing 15 minutes, walking without notable duration limitation.  10/12/20: stand 15-20 minutes, walking withot notable duratoin limitation    Time 6    Period Weeks    Status Partially Met    Target Date 11/02/20      PT LONG TERM GOAL #5   Title Patient will demonstrate understanding of basic self-management/down-regulation of the nervous system for persistent pain condition and stress as  evidenced by diaphragmatic breathing without cueing, body scan/progressive relaxation meditation, and improved sleep hygiene in order to transition to independent management of patient's chief complaint:persistent pain in multiple regions of the body.    Baseline Ie: not demonstrated; 08/18/20: partial understanding of pain science concepts. 10/12/20: patient verbalizes good understanding of sleep hygiene conceptsand and contribution of stress and contextual factors for persistent pain.    Time 6    Period Weeks    Status Partially Met      PT LONG TERM GOAL #6   Title Patient will be independent and compliant with advanced HEP as needed for self-management of condition and improved self-care, performance of household tasks, and sleep hygiene    Baseline 09/15/20: New HEP initiated. 10/12/20: Patient is compliant with current advanced HEP, plan to continue advancement to address various musculoskeletal concerns    Time 3    Period Weeks    Status Partially Met    Target Date 11/09/20                   Plan - 10/28/20 1832     Clinical Impression Statement Patient has progressed fairly well in spite of several body regions associated with pain and activity limitation along with significant psychosocial factors for which patient is following up with behavioral health. His upper quarter pain is well under control at this time and pt is continuing with established exercises. Completed assessment for complaint of R knee pain today. Patient has 3/5 signs of meniscus pathology per established clinical predication rule. No history of catching or locking. Low index of  suspicion for significant internal derangement or ligamentous instability given atraumatic onset and the findings above. (-) provocative testing and subjective findings for PFPS. Updated home program today to address impairments related to knee pain. Pt will need further therapy to ensure he has a robust HEP to maintain progress with therapy and continue to downregulate nervous system given context of generalized pain disorder/fibromyalgia. His condition is complicated by notable psychosocial contributors and various musculoskeletal concerns. Patient will benefit from continued skilled therapeutic intervention to address his remaining impairments and activity limitations for best return to PLOF and for improved QoL.    Personal Factors and Comorbidities Age;Behavior Pattern;Comorbidity 3+;Past/Current Experience;Time since onset of injury/illness/exacerbation;Profession    Comorbidities anxiety, arthritis, depression, GERD, OSA    Examination-Activity Limitations Bed Mobility;Sleep;Sit;Squat;Stairs;Stand;Locomotion Level;Lift    Examination-Participation Restrictions Occupation;Cleaning;Yard Work;Community Activity;Shop    Stability/Clinical Decision Making Evolving/Moderate complexity    Rehab Potential Fair    PT Frequency 2x / week    PT Duration 6 weeks    PT Treatment/Interventions Taping;Dry needling;Joint Manipulations;Spinal Manipulations;Manual techniques;Patient/family education;Neuromuscular re-education;Therapeutic exercise;Moist Heat;Cryotherapy;Electrical Stimulation    PT Next Visit Plan Manual therapy and traction for symptom modulation, postural re-edu, specific soft tissue stretching, neurodynamics; update HEP with successive PT visits. Transition to R knee as able.    PT Home Exercise Plan HEP Access Code [email or web address]    Consulted and Agree with Plan of Care Patient             Patient will benefit from skilled therapeutic intervention in order to improve the following deficits and  impairments:  Pain, Improper body mechanics, Difficulty walking, Hypomobility, Decreased activity tolerance  Visit Diagnosis: Chronic left shoulder pain  Low back pain, unspecified back pain laterality, unspecified chronicity, unspecified whether sciatica present  Bilateral foot pain  Difficulty in walking, not elsewhere classified     Problem List Patient Active Problem List   Diagnosis Date  Noted   Metatarsalgia, right foot 08/28/2020   Adjustment reaction with anxiety and depression 08/28/2020   Fibromyalgia 08/28/2020   Plantar fasciitis, right 07/14/2020   Cervical spondylosis with radiculopathy 06/01/2020   Supraspinatus syndrome, 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   Lumbar spondylosis 06/01/2020   Plantar fasciitis, 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   Chest pain of uncertain etiology 09/04/2019   OSA (obstructive sleep apnea) 06/04/2019   Current smoker 06/04/2019   Healthcare maintenance 06/04/2019   Acute pericarditis 04/03/2019   Sarcoidosis 10/16/2018   Lung nodules    Adenopathy    Essential hypertension 11/28/2017   Jeremy Peterson, PT, DPT #[id number]  Jeremy T Peterson 10/28/2020, 6:33 PM  [facility] [address] [place], [place], [address] Phone: [phone]   Fax:  [phone]  Name: Atzel Gebel MRN: [id number] Date of Birth: 03/29/1980    

## 2020-10-27 NOTE — Telephone Encounter (Signed)
Paperwork completed.  Left message for patient to return call.  He has to sign before I fax the appeal back.

## 2020-10-28 ENCOUNTER — Encounter: Payer: Self-pay | Admitting: Physical Therapy

## 2020-10-30 DIAGNOSIS — F331 Major depressive disorder, recurrent, moderate: Secondary | ICD-10-CM | POA: Diagnosis not present

## 2020-11-03 ENCOUNTER — Encounter: Payer: Medicaid Other | Admitting: Physical Therapy

## 2020-11-05 ENCOUNTER — Encounter: Payer: Self-pay | Admitting: Internal Medicine

## 2020-11-05 ENCOUNTER — Ambulatory Visit (INDEPENDENT_AMBULATORY_CARE_PROVIDER_SITE_OTHER): Payer: Medicaid Other | Admitting: Internal Medicine

## 2020-11-05 ENCOUNTER — Other Ambulatory Visit: Payer: Self-pay

## 2020-11-05 VITALS — BP 118/84 | HR 81 | Ht 74.0 in | Wt 251.0 lb

## 2020-11-05 DIAGNOSIS — I10 Essential (primary) hypertension: Secondary | ICD-10-CM | POA: Diagnosis not present

## 2020-11-05 DIAGNOSIS — Z8679 Personal history of other diseases of the circulatory system: Secondary | ICD-10-CM

## 2020-11-05 DIAGNOSIS — D869 Sarcoidosis, unspecified: Secondary | ICD-10-CM

## 2020-11-05 NOTE — Progress Notes (Signed)
Follow-up Outpatient Visit Date: 11/05/2020  Primary Care Provider: Juline Patch, MD [address] [place] [address]  Chief Complaint: Follow-up pericarditis and hypertension  HPI:  [name] is a 40 y.o. male with history of sarcoidosis, pericarditis, hypertension, arthritis, and GERD, who presents for follow-up of hypertension.  I last saw him in late June, at which time his blood pressure was suboptimally controlled.  We also noted PACs and PVCs.  We agreed to add bisoprolol 5 mg daily.  Today, [name] reports that he is feeling fairly well though he has experienced a little bit more exertional dyspnea since our last visit.  He attributes this to weight gain and inactivity.  He has put on about 15 pounds over the last 2 months per his home scale.  He notes his blood pressure has been well controlled.  He has been tolerating bisoprolol well.  He notes occasional sharp left-sided chest pain that can come and go over the course of an hour.  Discomfort is not exertional.  He has been dealing with quite a bit of pain in his feet attributed to frostbite and ensuing neuropathy.  --------------------------------------------------------------------------------------------------  Past Medical History:  Diagnosis Date   Anxiety    Arthritis    Depression    NO MEDS   GERD (gastroesophageal reflux disease)    NO MEDS   Hypertension    OSA on CPAP    Pneumonia    JUNE 2020   Sleep apnea    Past Surgical History:  Procedure Laterality Date   ELECTROMAGNETIC NAVIGATION BROCHOSCOPY Left 10/08/2018   Procedure: ELECTROMAGNETIC NAVIGATION BRONCHOSCOPY, LEFT;  Surgeon: Tyler Pita, MD;  Location: [facility];  Service: Cardiopulmonary;  Laterality: Left;   ENDOBRONCHIAL ULTRASOUND Left 10/08/2018   Procedure: ENDOBRONCHIAL ULTRASOUND, LEFT;  Surgeon: Tyler Pita, MD;  Location: [facility];  Service: Cardiopulmonary;  Laterality: Left;   NO PAST SURGERIES       Current Meds  Medication Sig   acetaminophen (TYLENOL) 500 MG tablet Take 500-1,000 mg by mouth every 6 (six) hours as needed.   amitriptyline (ELAVIL) 50 MG tablet One half tab PO qHS for a week, then one tab PO qHS.   amLODipine (NORVASC) 10 MG tablet Take 1 tablet (10 mg total) by mouth every morning. Cardiology   bisoprolol (ZEBETA) 5 MG tablet Take 1 tablet (5 mg total) by mouth daily.   DULoxetine (CYMBALTA) 60 MG capsule Take 60 mg by mouth daily.   pantoprazole (PROTONIX) 20 MG tablet Take 1 tablet (20 mg total) by mouth daily.   predniSONE (DELTASONE) 10 MG tablet TAKE 1 TABLET(10 MG) BY MOUTH DAILY WITH BREAKFAST   pregabalin (LYRICA) 100 MG capsule Take 1 capsule (100 mg total) by mouth 3 (three) times daily.   sertraline (ZOLOFT) 50 MG tablet Take by mouth.   triamterene-hydrochlorothiazide (MAXZIDE) 75-50 MG tablet Take 1 tablet by mouth daily.   UNABLE TO FIND CPAP AT NIGHT    Allergies: Latex  Social History   Tobacco Use   Smoking status: Every Day    Packs/day: 0.25    Years: 20.00    Pack years: 5.00    Types: Cigarettes    Start date: 03/07/1998   Smokeless tobacco: Never  Vaping Use   Vaping Use: Former   Start date: 04/18/2012   Quit date: 04/18/2016  Substance Use Topics   Alcohol use: Not Currently   Drug use: Not Currently    Family History  Problem Relation  Age of Onset   Hypertension Mother    Diabetes Mother    Diabetes Father    Hypertension Father    Heart attack Father 51   Lung cancer Maternal Grandmother     Review of Systems: A 12-system review of systems was performed and was negative except as noted in the HPI.  --------------------------------------------------------------------------------------------------  Physical Exam: BP 118/84 (BP Location: Left Arm, Patient Position: Sitting, Cuff Size: Large)   Pulse 81   Ht '6\' 2"'$  (1.88 m)   Wt 251 lb (113.9 kg)   SpO2 96%   BMI 32.23 kg/m   General:  NAD. Neck: No JVD or  HJR. Lungs: Clear to auscultation bilaterally without wheezes or crackles. Heart: Regular rate and rhythm without murmurs, rubs, or gallops. Abdomen: Soft, nontender, nondistended. Extremities: No lower extremity edema.  EKG: Normal sinus rhythm with nonspecific T wave abnormality.  Lab Results  Component Value Date   WBC 7.1 01/01/2019   HGB 14.7 01/01/2019   HCT 45.7 01/01/2019   MCV 80.3 01/01/2019   PLT 237 01/01/2019    Lab Results  Component Value Date   NA 136 08/18/2020   K 4.0 08/18/2020   CL 97 08/18/2020   CO2 22 08/18/2020   BUN 20 08/18/2020   CREATININE 1.19 08/18/2020   GLUCOSE 93 08/18/2020   ALT 52 (H) 01/01/2019    No results found for: CHOL, HDL, LDLCALC, LDLDIRECT, TRIG, CHOLHDL  --------------------------------------------------------------------------------------------------  ASSESSMENT AND PLAN: Hypertension: Blood pressure control improved with addition of bisoprolol at our last visit.  We will defer medication changes today.  History of pericarditis: Rare episodes of sharp left-sided chest pain reported.  He is asymptomatic today.  MRI last year showed no pericardial effusion.  Defer further work-up at this time.  Sarcoidosis: [name] reports slight worsening of his chronic exertional dyspnea that he attributes to inactivity and deconditioning.  It has been almost a year since he last saw Dr. Patsey Berthold.  I have encouraged him to schedule an appointment for follow-up with her at his earliest convenience.  Follow-up: Return to clinic in 6 months.  Nelva Bush, MD 11/05/2020 10:33 AM

## 2020-11-05 NOTE — Patient Instructions (Signed)
Medication Instructions:   Your physician recommends that you continue on your current medications as directed. Please refer to the Current Medication list given to you today.  *If you need a refill on your cardiac medications before your next appointment, please call your pharmacy*   Lab Work:  None ordered  Testing/Procedures:  None ordered   Follow-Up: At [facility], you and your health needs are our priority.  As part of our continuing mission to provide you with exceptional heart care, we have created designated Provider Care Teams.  These Care Teams include your primary Cardiologist (physician) and Advanced Practice Providers (APPs -  Physician Assistants and Nurse Practitioners) who all work together to provide you with the care you need, when you need it.  We recommend signing up for the patient portal called "MyChart".  Sign up information is provided on this After Visit Summary.  MyChart is used to connect with patients for Virtual Visits (Telemedicine).  Patients are able to view lab/test results, encounter notes, upcoming appointments, etc.  Non-urgent messages can be sent to your provider as well.   To learn more about what you can do with MyChart, go to [email or web address].    Your next appointment:   6 month(s)  The format for your next appointment:   In Person  Provider:   You may see Nelva Bush, MD or one of the following Advanced Practice Providers on your designated Care Team:   Murray Hodgkins, NP Christell Faith, PA-C Marrianne Mood, PA-C Cadence Kathlen Mody, [place]   Other Instructions  Please try to see Dr. Patsey Berthold at your convenience.

## 2020-11-06 ENCOUNTER — Encounter: Payer: Self-pay | Admitting: Internal Medicine

## 2020-11-06 DIAGNOSIS — Z8679 Personal history of other diseases of the circulatory system: Secondary | ICD-10-CM | POA: Insufficient documentation

## 2020-11-06 NOTE — Telephone Encounter (Signed)
Patient is approved for MRI bilateral feet after appeal.  Scheduled 11/17/20.  Please schedule follow-up for review around 9/15 or 9/16.

## 2020-11-10 ENCOUNTER — Encounter: Payer: Medicaid Other | Admitting: Physical Therapy

## 2020-11-13 ENCOUNTER — Telehealth: Payer: Self-pay

## 2020-11-13 NOTE — Telephone Encounter (Signed)
CPT code corrected via Lionel December.  Nothing further needed.

## 2020-11-13 NOTE — Telephone Encounter (Signed)
Copied from [place] [phone]. Topic: General - Other >> Nov 13, 2020  9:05 AM Alanda Slim E wrote: Reason for CRM: Kirke Shaggy from [facility] called and stated they have the wrong CPT code for the pts MRI's that were ordered/ please advise

## 2020-11-17 ENCOUNTER — Ambulatory Visit: Payer: Medicaid Other

## 2020-11-17 ENCOUNTER — Encounter: Payer: Medicaid Other | Admitting: Physical Therapy

## 2020-11-22 ENCOUNTER — Other Ambulatory Visit: Payer: Self-pay

## 2020-11-22 ENCOUNTER — Ambulatory Visit
Admission: RE | Admit: 2020-11-22 | Discharge: 2020-11-22 | Disposition: A | Discharge status: Home / Self Care | Payer: Medicaid Other | Source: Ambulatory Visit | Attending: Family Medicine | Admitting: Family Medicine

## 2020-11-22 DIAGNOSIS — M7741 Metatarsalgia, right foot: Secondary | ICD-10-CM | POA: Diagnosis not present

## 2020-11-22 DIAGNOSIS — M722 Plantar fascial fibromatosis: Secondary | ICD-10-CM

## 2020-11-22 DIAGNOSIS — M79671 Pain in right foot: Secondary | ICD-10-CM | POA: Diagnosis not present

## 2020-11-22 DIAGNOSIS — M79672 Pain in left foot: Secondary | ICD-10-CM | POA: Diagnosis not present

## 2020-11-22 DIAGNOSIS — M25475 Effusion, left foot: Secondary | ICD-10-CM | POA: Diagnosis not present

## 2020-11-23 ENCOUNTER — Ambulatory Visit: Payer: Medicaid Other | Admitting: Family Medicine

## 2020-11-24 ENCOUNTER — Encounter: Payer: Medicaid Other | Admitting: Physical Therapy

## 2020-11-26 ENCOUNTER — Ambulatory Visit (INDEPENDENT_AMBULATORY_CARE_PROVIDER_SITE_OTHER): Payer: Medicaid Other | Admitting: Family Medicine

## 2020-11-26 ENCOUNTER — Encounter: Payer: Self-pay | Admitting: Family Medicine

## 2020-11-26 ENCOUNTER — Other Ambulatory Visit: Payer: Self-pay

## 2020-11-26 VITALS — BP 118/72 | HR 69 | Temp 98.1°F | Ht 74.0 in | Wt 248.0 lb

## 2020-11-26 DIAGNOSIS — F5104 Psychophysiologic insomnia: Secondary | ICD-10-CM | POA: Insufficient documentation

## 2020-11-26 DIAGNOSIS — M722 Plantar fascial fibromatosis: Secondary | ICD-10-CM

## 2020-11-26 MED ORDER — BELSOMRA 10 MG PO TABS: 1.0000 | ORAL_TABLET | Freq: Every day | ORAL | 0 refills | Status: DC

## 2020-11-26 NOTE — Assessment & Plan Note (Signed)
See additional assessment(s) for plan details. 

## 2020-11-26 NOTE — Patient Instructions (Signed)
-   Start Belsomra nightly, contact our office at the end of sample to report how you are doing with this medication - Continue with home exercises - Return for follow-up in 2 weeks

## 2020-11-26 NOTE — Assessment & Plan Note (Signed)
Patient with stable symptomatology for chronic bilateral plantar fasciitis.  Concern for additional psychosomatic component given his comorbid medical history.  This is being actively addressed by [facility] behavioral center and recent initiation of sertraline.  He further expands on his bilateral heel pain to discuss difficulties with sleep, sensation to move legs, this is a chronic condition as well.  Have advised short-term trial of Belsomra 10 mg nightly and number 6 tablets were provided as a sample.  Additionally, he is due to continue home exercises, modified shoe wear, and return for follow-up in 2 weeks.

## 2020-11-26 NOTE — Progress Notes (Signed)
Primary Care / Sports Medicine Office Visit  Patient Information:  Patient ID: Luis Mcknight, male DOB: [birth year] Age: 40 y.o. MRN: [id number]   Luis Mcknight is a pleasant 40 y.o. 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   Lumbar spondylosis 06/01/2020   Plantar fasciitis, 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   Chest pain of uncertain etiology 44/31/5400   OSA (obstructive sleep apnea) 06/04/2019   Current smoker 06/04/2019   Healthcare maintenance 06/04/2019   Acute pericarditis 04/03/2019   Sarcoidosis 10/16/2018   Lung nodules    Adenopathy    Essential hypertension 11/28/2017   Past Medical History:  Diagnosis Date   Anxiety    Arthritis    Depression    NO MEDS   GERD (gastroesophageal reflux disease)    NO MEDS   Hypertension    OSA on CPAP    Pneumonia    JUNE 2020   Sleep apnea    Outpatient Encounter Medications as of 11/26/2020  Medication Sig   acetaminophen (TYLENOL) 500 MG tablet Take 500-1,000 mg by mouth every 6 (six) hours as needed.   amLODipine (NORVASC) 10 MG tablet Take 1 tablet (10 mg total) by mouth every morning. Cardiology   bisoprolol (ZEBETA) 5 MG tablet Take 1 tablet (5 mg total) by mouth daily.   pantoprazole (PROTONIX) 20 MG tablet Take 1 tablet (20 mg total) by mouth daily.   predniSONE (DELTASONE) 10 MG tablet TAKE 1 TABLET(10 MG) BY MOUTH DAILY WITH BREAKFAST   pregabalin (LYRICA) 100 MG capsule Take 1 capsule (100 mg total)  by mouth 3 (three) times daily.   sertraline (ZOLOFT) 50 MG tablet Take 50 mg by mouth daily.   Suvorexant (BELSOMRA) 10 MG TABS Take 1 tablet by mouth at bedtime.   triamterene-hydrochlorothiazide (MAXZIDE) 75-50 MG tablet Take 1 tablet by mouth daily.   UNABLE TO FIND CPAP AT NIGHT   [DISCONTINUED] amitriptyline (ELAVIL) 50 MG tablet One half tab PO qHS for a week, then one tab PO qHS.   [DISCONTINUED] DULoxetine (CYMBALTA) 60 MG capsule Take 60 mg by mouth daily.   No facility-administered encounter medications on file as of 11/26/2020.   Past Surgical History:  Procedure Laterality Date   ELECTROMAGNETIC NAVIGATION BROCHOSCOPY Left 10/08/2018   Procedure: ELECTROMAGNETIC NAVIGATION BRONCHOSCOPY, LEFT;  Surgeon: Tyler Pita, MD;  Location: [facility] ORS;  Service: Cardiopulmonary;  Laterality: Left;   ENDOBRONCHIAL ULTRASOUND Left 10/08/2018   Procedure: ENDOBRONCHIAL ULTRASOUND, LEFT;  Surgeon: Tyler Pita, MD;  Location: [facility] ORS;  Service: Cardiopulmonary;  Laterality: Left;   NO PAST SURGERIES      Vitals:   11/26/20 1141  BP: 118/72  Pulse: 69  Temp: 98.1 F (36.7 C)  SpO2: 96%   Vitals:   11/26/20 1141  Weight: 248 lb (112.5 kg)  Height: 6\' 2"  (1.88 m)   Body mass index is 31.84 kg/m.  MR FOOT RIGHT WO CONTRAST  Result Date: 11/23/2020 CLINICAL DATA:  Foot pain, chronic, plantar fasciitis suspected PF chronic pain  despite injection, medial metatarsalgia EXAM: MRI OF THE RIGHT FOREFOOT WITHOUT CONTRAST TECHNIQUE: Multiplanar, multisequence MR imaging of the right forefoot was performed. No intravenous contrast was administered. COMPARISON:  X-ray 09/23/2020 FINDINGS: Bones/Joint/Cartilage No acute fracture. No dislocation. No bone marrow edema or periostitis. No erosion. Small first MTP joint effusion, nonspecific. No focal cartilage defect identified. No suspicious bone lesion. Ligaments Intact Lisfranc ligament. Collateral ligaments of the forefoot are intact. The  plantar fascia within the midfoot and forefoot are intact. Muscles and Tendons Intact flexor and extensor tendons without tendinosis, tear, or tenosynovitis. Normal muscle bulk and signal intensity without edema, atrophy, or fatty infiltration. Soft tissues No significant abnormality within the intermetatarsal spaces. No soft tissue edema or fluid collections. IMPRESSION: 1. Small first MTP joint effusion, nonspecific. Otherwise unremarkable MRI of the right forefoot. 2. Plantar fascia at the level of the midfoot and forefoot appears unremarkable. Please note that the hindfoot is not included within the field of view. Electronically Signed   By: Davina Poke D.O.   On: 11/23/2020 11:59   MR FOOT LEFT WO CONTRAST  Result Date: 11/23/2020 CLINICAL DATA:  Foot pain, chronic, plantar fasciitis suspected PF chronic pain despite injection EXAM: MRI OF THE LEFT FOOT WITHOUT CONTRAST TECHNIQUE: Multiplanar, multisequence MR imaging of the left forefoot was performed. No intravenous contrast was administered. COMPARISON:  X-ray 09/23/2020 FINDINGS: Bones/Joint/Cartilage No acute fracture. No dislocation. No bone marrow edema or periostitis. No erosion. Small first MTP joint effusion, nonspecific. No focal cartilage defect identified. No suspicious bone lesion. Ligaments Intact Lisfranc ligament. Collateral ligaments of the forefoot intact. Plantar fascia within the midfoot and forefoot is intact. Muscles and Tendons Intact flexor and extensor tendons without tendinosis, tear, or tenosynovitis. Normal muscle bulk and signal intensity without edema, atrophy, or fatty infiltration. Soft tissues Trace fluid within the second intermetatarsal space. Otherwise, no soft tissue edema or fluid collections. IMPRESSION: 1. Small left first MTP joint effusion, nonspecific. 2. Trace fluid within the second intermetatarsal space, which could reflect a mild bursitis. 3. Plantar fascia at the level of the midfoot and forefoot appears  unremarkable. Please note that the hindfoot is not included within the field of view. Electronically Signed   By: Davina Poke D.O.   On: 11/23/2020 12:10     Independent interpretation of notes and tests performed by another provider:   None  Procedures performed:   None  Pertinent History, Exam, Impression, and Recommendations:   Plantar fasciitis, right Patient with stable symptomatology for chronic bilateral plantar fasciitis.  Concern for additional psychosomatic component given his comorbid medical history.  This is being actively addressed by [facility] behavioral center and recent initiation of sertraline.  He further expands on his bilateral heel pain to discuss difficulties with sleep, sensation to move legs, this is a chronic condition as well.  Have advised short-term trial of Belsomra 10 mg nightly and number 6 tablets were provided as a sample.  Additionally, he is due to continue home exercises, modified shoe wear, and return for follow-up in 2 weeks.  Psychophysiological insomnia See additional assessment(s) for plan details.  Plantar fasciitis, left See additional assessment(s) for plan details.   Orders & Medications Meds ordered this encounter  Medications   Suvorexant (BELSOMRA) 10 MG TABS    Sig: Take 1 tablet by mouth at bedtime.    Dispense:  6 tablet    Refill:  0   No orders of the defined types were placed in this encounter.    Return in  about 2 weeks (around 12/10/2020) for Follow-up bilateral plantar fasciitis.     Montel Culver, MD   Primary Care Sports Medicine [place]

## 2020-11-27 ENCOUNTER — Encounter: Payer: Self-pay | Admitting: Family Medicine

## 2020-11-27 DIAGNOSIS — R202 Paresthesia of skin: Secondary | ICD-10-CM

## 2020-11-27 DIAGNOSIS — M722 Plantar fascial fibromatosis: Secondary | ICD-10-CM

## 2020-11-27 NOTE — Telephone Encounter (Signed)
Do you want to refer to neurology so a EMG can be conducted?

## 2020-11-30 ENCOUNTER — Encounter: Payer: Self-pay | Admitting: Neurology

## 2020-12-02 ENCOUNTER — Other Ambulatory Visit: Payer: Self-pay | Admitting: Family Medicine

## 2020-12-02 DIAGNOSIS — K219 Gastro-esophageal reflux disease without esophagitis: Secondary | ICD-10-CM

## 2020-12-04 DIAGNOSIS — F331 Major depressive disorder, recurrent, moderate: Secondary | ICD-10-CM | POA: Diagnosis not present

## 2020-12-13 ENCOUNTER — Other Ambulatory Visit: Payer: Self-pay | Admitting: Pulmonary Disease

## 2020-12-14 ENCOUNTER — Ambulatory Visit: Payer: Medicaid Other | Admitting: Family Medicine

## 2020-12-14 ENCOUNTER — Telehealth: Payer: Self-pay

## 2020-12-14 NOTE — Telephone Encounter (Signed)
Copied from [place] [id number]. Topic: Appointment Scheduling - Scheduling Inquiry for Clinic >> Dec 14, 2020  8:14 AM Oneta Rack wrote: Reason for CRM:  patient cancelled his 10am appointment with Dr. Zigmund Daniel for today stating he does not feel well. Patient unsure if PCP would like him to rsc prior to his is 12/24/2020 with [facility] [place]

## 2020-12-14 NOTE — Telephone Encounter (Signed)
Please advise, we are not patient's PCP.  Patient was following up on plantar fasciitis.

## 2020-12-23 ENCOUNTER — Other Ambulatory Visit: Payer: Self-pay

## 2020-12-23 DIAGNOSIS — R202 Paresthesia of skin: Secondary | ICD-10-CM

## 2020-12-24 ENCOUNTER — Ambulatory Visit: Payer: Medicaid Other | Admitting: Neurology

## 2020-12-24 ENCOUNTER — Other Ambulatory Visit: Payer: Self-pay

## 2020-12-24 DIAGNOSIS — R202 Paresthesia of skin: Secondary | ICD-10-CM

## 2020-12-24 NOTE — Addendum Note (Signed)
Addended by: Forbes Cellar on: 12/24/2020 04:18 PM   Modules accepted: Orders

## 2020-12-24 NOTE — Procedures (Signed)
[facility]  [address], [address]  [place], [place] [address] Tel: [phone] Fax:  [phone] Test Date:  12/24/2020  Patient: Luis Mcknight DOB: 04-11-1980 Physician: Narda Amber, DO  Sex: Male Height: 6\' 2"  Ref Phys: Brigitte Pulse, MD  ID#: [id number]   Technician:    Patient Complaints: This is a 40 year old man referred for evaluation of bilateral feet pain.  NCV & EMG Findings: Electrodiagnostic testing of the right lower extremity and additional studies of the left shows: Bilateral sural and superficial peroneal sensory responses are within normal limits. Bilateral peroneal and tibial motor responses are within normal limits. Bilateral tibial H reflex studies are within normal limits. There is no evidence of active or chronic motor axonal changes affecting any of the tested muscles.  Motor unit configuration and recruitment pattern is within normal limits.  Impression: This is a normal study of the lower extremities.  In particular, there is no evidence of a sensorimotor polyneuropathy or lumbosacral radiculopathy.    ___________________________ Narda Amber, DO    Nerve Conduction Studies Anti Sensory Summary Table   Stim Site NR Peak (ms) Norm Peak (ms) P-T Amp (V) Norm P-T Amp  Left Sup Peroneal Anti Sensory (Ant Lat Mall)  12 cm    2.8 <4.5 25.9 >5  Right Sup Peroneal Anti Sensory (Ant Lat Mall)  12 cm    2.7 <4.5 25.5 >5  Left Sural Anti Sensory (Lat Mall)  Calf    3.2 <4.5 38.8 >5  Right Sural Anti Sensory (Lat Mall)  Calf    3.9 <4.5 38.3 >5   Motor Summary Table   Stim Site NR Onset (ms) Norm Onset (ms) O-P Amp (mV) Norm O-P Amp Site1 Site2 Delta-0 (ms) Dist (cm) Vel (m/s) Norm Vel (m/s)  Left Peroneal Motor (Ext Dig Brev)  Ankle    5.1 <5.5 4.0 >3 B Fib Ankle 9.0 42.0 47 >40  B Fib    14.1  3.1  Poplt B Fib 1.8 10.0 56 >40  Poplt    15.9  2.8         Right Peroneal Motor (Ext Dig Brev)  Ankle    3.7 <5.5 5.4 >3 B Fib Ankle  9.0 40.0 44 >40  B Fib    12.7  4.6  Poplt B Fib 1.8 10.0 56 >40  Poplt    14.5  4.3         Left Tibial Motor (Abd Hall Brev)  Ankle    4.8 <6.0 11.0 >8 Knee Ankle 10.5 44.0 42 >40  Knee    15.3  8.8         Right Tibial Motor (Abd Hall Brev)  Ankle    3.4 <6.0 9.7 >8 Knee Ankle 11.2 47.0 42 >40  Knee    14.6  6.8          H Reflex Studies   NR H-Lat (ms) Lat Norm (ms) L-R H-Lat (ms)  Left Tibial (Gastroc)     34.01 <35 2.04  Right Tibial (Gastroc)     31.97 <35 2.04   EMG   Side Muscle Ins Act Fibs Psw Fasc Number Recrt Dur Dur. Amp Amp. Poly Poly. Comment  Right AntTibialis Nml Nml Nml Nml Nml Nml Nml Nml Nml Nml Nml Nml N/A  Right Gastroc Nml Nml Nml Nml Nml Nml Nml Nml Nml Nml Nml Nml N/A  Right Flex Dig Long Nml Nml Nml Nml Nml Nml Nml Nml Nml Nml Nml Nml N/A  Right RectFemoris Nml Nml Nml Nml Nml Nml Nml Nml Nml Nml Nml Nml N/A  Right BicepsFemS Nml Nml Nml Nml Nml Nml Nml Nml Nml Nml Nml Nml N/A  Left BicepsFemS Nml Nml Nml Nml Nml Nml Nml Nml Nml Nml Nml Nml N/A  Left AntTibialis Nml Nml Nml Nml Nml Nml Nml Nml Nml Nml Nml Nml N/A  Left Gastroc Nml Nml Nml Nml Nml Nml Nml Nml Nml Nml Nml Nml N/A  Left Flex Dig Long Nml Nml Nml Nml Nml Nml Nml Nml Nml Nml Nml Nml N/A  Left RectFemoris Nml Nml Nml Nml Nml Nml Nml Nml Nml Nml Nml Nml N/A      Waveforms:

## 2020-12-31 ENCOUNTER — Other Ambulatory Visit: Payer: Self-pay | Admitting: Internal Medicine

## 2021-01-01 DIAGNOSIS — F331 Major depressive disorder, recurrent, moderate: Secondary | ICD-10-CM | POA: Diagnosis not present

## 2021-01-26 ENCOUNTER — Encounter: Payer: Self-pay | Admitting: Family Medicine

## 2021-02-11 ENCOUNTER — Other Ambulatory Visit: Payer: Self-pay | Admitting: Family Medicine

## 2021-02-11 DIAGNOSIS — M75102 Unspecified rotator cuff tear or rupture of left shoulder, not specified as traumatic: Secondary | ICD-10-CM

## 2021-02-11 DIAGNOSIS — M722 Plantar fascial fibromatosis: Secondary | ICD-10-CM

## 2021-02-11 DIAGNOSIS — M47816 Spondylosis without myelopathy or radiculopathy, lumbar region: Secondary | ICD-10-CM

## 2021-02-11 DIAGNOSIS — M4722 Other spondylosis with radiculopathy, cervical region: Secondary | ICD-10-CM

## 2021-02-11 NOTE — Telephone Encounter (Signed)
Requested medications are due for refill today.  no  Requested medications are on the active medications list.  no  Last refill. 06/01/2020  Future visit scheduled.   yes  Notes to clinic.  Medication was discontinued by Dr. Zigmund Daniel 07/14/2020.    Requested Prescriptions  Pending Prescriptions Disp Refills   DULoxetine (CYMBALTA) 60 MG capsule [Pharmacy Med Name: DULOXETINE DR 60MG  CAPSULES] 30 capsule 3    Sig: TAKE 1 CAPSULE(60 MG) BY MOUTH DAILY     Psychiatry: Antidepressants - SNRI Passed - 02/11/2021  6:20 AM      Passed - Last BP in normal range    BP Readings from Last 1 Encounters:  11/26/20 118/72          Passed - Valid encounter within last 6 months    Recent Outpatient Visits           2 months ago Plantar fasciitis, right   [facility] Montel Culver, MD   4 months ago [facility] Montel Culver, MD   5 months ago Plantar fasciitis, right   [facility] Montel Culver, MD   5 months ago Essential hypertension   [facility] Juline Patch, MD   7 months ago Plantar fasciitis, left   [facility] Montel Culver, MD       Future Appointments             In 1 week Juline Patch, MD [facility], [place]   In 2 months End, Harrell Gave, MD [facility], [place]

## 2021-02-19 ENCOUNTER — Ambulatory Visit (INDEPENDENT_AMBULATORY_CARE_PROVIDER_SITE_OTHER): Payer: Medicaid Other | Admitting: Family Medicine

## 2021-02-19 ENCOUNTER — Other Ambulatory Visit: Payer: Self-pay

## 2021-02-19 ENCOUNTER — Encounter: Payer: Self-pay | Admitting: Family Medicine

## 2021-02-19 VITALS — BP 124/76 | HR 78 | Temp 99.0°F | Ht 74.0 in | Wt 246.0 lb

## 2021-02-19 DIAGNOSIS — R21 Rash and other nonspecific skin eruption: Secondary | ICD-10-CM

## 2021-02-19 DIAGNOSIS — F17200 Nicotine dependence, unspecified, uncomplicated: Secondary | ICD-10-CM

## 2021-02-19 DIAGNOSIS — D869 Sarcoidosis, unspecified: Secondary | ICD-10-CM

## 2021-02-19 DIAGNOSIS — J0100 Acute maxillary sinusitis, unspecified: Secondary | ICD-10-CM

## 2021-02-19 DIAGNOSIS — K219 Gastro-esophageal reflux disease without esophagitis: Secondary | ICD-10-CM | POA: Diagnosis not present

## 2021-02-19 DIAGNOSIS — Z6830 Body mass index (BMI) 30.0-30.9, adult: Secondary | ICD-10-CM

## 2021-02-19 DIAGNOSIS — R509 Fever, unspecified: Secondary | ICD-10-CM

## 2021-02-19 DIAGNOSIS — I10 Essential (primary) hypertension: Secondary | ICD-10-CM | POA: Diagnosis not present

## 2021-02-19 LAB — POCT INFLUENZA A/B
Influenza A, POC: NEGATIVE
Influenza B, POC: NEGATIVE

## 2021-02-19 MED ORDER — PANTOPRAZOLE SODIUM 20 MG PO TBEC: DELAYED_RELEASE_TABLET | ORAL | 1 refills | Status: DC

## 2021-02-19 MED ORDER — AMOXICILLIN-POT CLAVULANATE 875-125 MG PO TABS: 1.0000 | ORAL_TABLET | Freq: Two times a day (BID) | ORAL | 0 refills | Status: DC

## 2021-02-19 MED ORDER — TRIAMTERENE-HCTZ 75-50 MG PO TABS: 1.0000 | ORAL_TABLET | Freq: Every day | ORAL | 1 refills | Status: DC

## 2021-02-19 NOTE — Progress Notes (Signed)
° ° °Date:  02/19/2021  ° °Name:  Luis Mcknight   DOB:  08/08/1980   MRN:  [id number] ° ° °Chief Complaint: Gastroesophageal Reflux and Hypertension ° °Gastroesophageal Reflux °He complains of coughing. He reports no abdominal pain, no belching, no chest pain, no choking, no dysphagia, no early satiety, no globus sensation, no heartburn, no hoarse voice, no nausea, no sore throat or no wheezing. This is a chronic problem. He has tried a PPI for the symptoms. The treatment provided moderate relief.  °Hypertension °This is a chronic problem. The current episode started more than 1 year ago. The problem has been gradually improving since onset. The problem is controlled. Associated symptoms include headaches and shortness of breath. Pertinent negatives include no chest pain, neck pain or palpitations. There are no associated agents to hypertension. Past treatments include beta blockers, calcium channel blockers and diuretics. The current treatment provides moderate improvement. There are no compliance problems.  There is no history of angina, kidney disease, CAD/MI, CVA, heart failure, left ventricular hypertrophy, PVD or retinopathy. There is no history of chronic renal disease, a hypertension causing med or renovascular disease.  °Cough °This is a new problem. The current episode started in the past 7 days. The problem has been waxing and waning. The problem occurs hourly. The cough is Productive of purulent sputum (green). Associated symptoms include chills, a fever, headaches, nasal congestion, rhinorrhea and shortness of breath. Pertinent negatives include no chest pain, ear congestion, ear pain, heartburn, myalgias, postnasal drip, rash, sore throat or wheezing. There is no history of environmental allergies.  ° °Lab Results  °Component Value Date  ° NA 136 08/18/2020  ° K 4.0 08/18/2020  ° CO2 22 08/18/2020  ° GLUCOSE 93 08/18/2020  ° BUN 20 08/18/2020  ° CREATININE 1.19 08/18/2020  ° CALCIUM 9.6 08/18/2020  °  EGFR 81 08/18/2020  ° GFRNONAA 99 04/03/2019  ° °No results found for: CHOL, HDL, LDLCALC, LDLDIRECT, TRIG, CHOLHDL °No results found for: TSH °No results found for: HGBA1C °Lab Results  °Component Value Date  ° WBC 7.1 01/01/2019  ° HGB 14.7 01/01/2019  ° HCT 45.7 01/01/2019  ° MCV 80.3 01/01/2019  ° PLT 237 01/01/2019  ° °Lab Results  °Component Value Date  ° ALT 52 (H) 01/01/2019  ° AST 23 01/01/2019  ° ALKPHOS 101 01/01/2019  ° BILITOT 0.6 01/01/2019  ° °No results found for: 25OHVITD2, 25OHVITD3, VD25OH  ° °Review of Systems  °Constitutional:  Positive for chills and fever.  °HENT:  Positive for rhinorrhea. Negative for drooling, ear discharge, ear pain, hoarse voice, postnasal drip and sore throat.   °Respiratory:  Positive for cough and shortness of breath. Negative for choking and wheezing.   °Cardiovascular:  Negative for chest pain, palpitations and leg swelling.  °Gastrointestinal:  Negative for abdominal pain, blood in stool, constipation, diarrhea, dysphagia, heartburn and nausea.  °Endocrine: Negative for polydipsia.  °Genitourinary:  Negative for dysuria, frequency, hematuria and urgency.  °Musculoskeletal:  Negative for back pain, myalgias and neck pain.  °Skin:  Negative for rash.  °Allergic/Immunologic: Negative for environmental allergies.  °Neurological:  Positive for headaches. Negative for dizziness.  °Hematological:  Does not bruise/bleed easily.  °Psychiatric/Behavioral:  Negative for suicidal ideas. The patient is not nervous/anxious.   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   Lumbar spondylosis 06/01/2020   Plantar fasciitis, left  06/01/2020   Chest pain of uncertain etiology 16/12/9602   OSA (obstructive sleep apnea) 06/04/2019   Current smoker 06/04/2019   Healthcare maintenance 06/04/2019   Acute pericarditis 04/03/2019   Sarcoidosis 10/16/2018   Lung nodules    Adenopathy    Essential hypertension 11/28/2017    Allergies  Allergen Reactions   Latex Itching    GLOVES    Past Surgical History:  Procedure Laterality Date   ELECTROMAGNETIC NAVIGATION BROCHOSCOPY Left 10/08/2018   Procedure: ELECTROMAGNETIC NAVIGATION BRONCHOSCOPY, LEFT;  Surgeon: Tyler Pita, MD;  Location: [facility] ORS;  Service: Cardiopulmonary;  Laterality: Left;   ENDOBRONCHIAL ULTRASOUND Left 10/08/2018   Procedure: ENDOBRONCHIAL ULTRASOUND, LEFT;  Surgeon: Tyler Pita, MD;  Location: [facility] ORS;  Service: Cardiopulmonary;  Laterality: Left;   NO PAST SURGERIES      Social History   Tobacco Use   Smoking status: Every Day    Packs/day: 0.25    Years: 20.00    Pack years: 5.00    Types: Cigarettes    Start date: 03/07/1998   Smokeless tobacco: Never  Vaping Use   Vaping Use: Former   Start date: 04/18/2012   Quit date: 04/18/2016  Substance Use Topics   Alcohol use: Not Currently   Drug use: Not Currently     Medication list has been reviewed and updated.  Current Meds  Medication Sig   acetaminophen (TYLENOL) 500 MG tablet Take 500-1,000 mg by mouth every 6 (six) hours as needed.   amitriptyline (ELAVIL) 50 MG tablet Take 1 tablet by mouth at bedtime.   amLODipine (NORVASC) 10 MG tablet Take 1 tablet (10 mg total) by mouth every morning. Cardiology   bisoprolol (ZEBETA) 5 MG tablet Take 1 tablet by mouth once daily   DULoxetine (CYMBALTA) 60 MG capsule Take 60 mg by mouth daily.   pantoprazole (PROTONIX) 20 MG tablet TAKE 1 TABLET(20 MG) BY MOUTH DAILY   predniSONE (DELTASONE) 10 MG tablet TAKE 1 TABLET(10 MG) BY MOUTH DAILY WITH BREAKFAST   pregabalin (LYRICA) 100 MG capsule Take 1 capsule (100 mg total) by mouth  3 (three) times daily.   triamterene-hydrochlorothiazide (MAXZIDE) 75-50 MG tablet Take 1 tablet by mouth daily.    PHQ 2/9 Scores 02/19/2021 11/26/2020 09/30/2020 08/28/2020  PHQ - 2 Score _0 PHQ- 9 Score _1 GAD 7 : Generalized Anxiety Score 02/19/2021 11/26/2020 09/30/2020 08/28/2020  Nervous, Anxious, on Edge _2 Control/stop worrying _3 Worry too much - different things _4 Trouble relaxing _5 Restless _6 Easily annoyed or irritable _7 Afraid - awful might happen _8 Total GAD 7 Score _9 Anxiety Difficulty Not difficult at all Very difficult Extremely difficult Very difficult    BP Readings from Last 3 Encounters:  02/19/21 124/76  11/26/20 118/72  11/05/20 118/84    Physical Exam Vitals and nursing note reviewed.  HENT:     Head: Normocephalic.     Right Ear: Tympanic membrane and external ear normal.     Left Ear: Tympanic membrane and external ear normal.     Nose:     Right Turbinates:  Swollen.     Left Turbinates: Swollen.     Right Sinus: Maxillary sinus tenderness present. No frontal sinus tenderness.     Left Sinus: Maxillary sinus tenderness present. No frontal sinus tenderness.  Eyes:     General: No scleral icterus.       Right eye: No discharge.        Left eye: No discharge.     Conjunctiva/sclera: Conjunctivae normal.     Pupils: Pupils are equal, round, and reactive to light.  Neck:     Thyroid: No thyroid mass, thyromegaly or thyroid tenderness.     Vascular: No JVD.     Trachea: No tracheal deviation.  Cardiovascular:     Rate and Rhythm: Normal rate and regular rhythm.     Heart sounds: Normal heart sounds, S1 normal and S2 normal. No murmur heard. No systolic murmur is present.  No diastolic murmur is present.    No friction rub. No gallop. No S3 or S4 sounds.  Pulmonary:     Effort: No respiratory distress.     Breath sounds: Normal breath sounds. No decreased breath sounds,  wheezing, rhonchi or rales.  Abdominal:     General: Bowel sounds are normal.     Palpations: Abdomen is soft. There is no mass.     Tenderness: There is no abdominal tenderness. There is no guarding or rebound.  Musculoskeletal:        General: No tenderness. Normal range of motion.     Cervical back: Full passive range of motion without pain, normal range of motion and neck supple.  Lymphadenopathy:     Cervical: No cervical adenopathy.     Right cervical: No superficial, deep or posterior cervical adenopathy.    Left cervical: No superficial, deep or posterior cervical adenopathy.  Skin:    General: Skin is warm.     Findings: Rash present. Rash is macular.     Comments: hyperpigmented  Neurological:     Mental Status: He is alert and oriented to person, place, and time.     Cranial Nerves: No cranial nerve deficit.     Deep Tendon Reflexes: Reflexes are normal and symmetric.    Wt Readings from Last 3 Encounters:  02/19/21 246 lb (111.6 kg)  11/26/20 248 lb (112.5 kg)  11/05/20 251 lb (113.9 kg)    BP 124/76    Pulse 78    Ht 6' 2" (1.88 m)    Wt 246 lb (111.6 kg)    BMI 31.58 kg/m   Assessment and Plan:  1. Essential hypertension Chronic.  Controlled.  Stable.  Blood pressure is 124/76.  Continue triamterene once a day. - triamterene-hydrochlorothiazide (MAXZIDE) 75-50 MG tablet; Take 1 tablet by mouth daily.  Dispense: 90 tablet; Refill: 1  2. Gastroesophageal reflux disease without esophagitis Chronic.  Controlled.  Stable.  Continue pantoprazole 20 mg once a day. - pantoprazole (PROTONIX) 20 MG tablet; TAKE 1 TABLET(20 MG) BY MOUTH DAILY  Dispense: 90 tablet; Refill: 1  3. Acute maxillary sinusitis, recurrence not specified New onset.  Tenderness over the maxillary sinuses bilateral patient has low-grade fever of 99.  We will treat with Augmentin 875 mg 1 twice a day. - amoxicillin-clavulanate (AUGMENTIN) 875-125 MG tablet; Take 1 tablet by mouth 2 (two) times  daily.  Dispense: 20 tablet; Refill: 0  4. Rash Chronic.  Has had for several years and has been told that it may be related to the sarcoid which it may be but it  tinea versicolor as well we will refer for dermatology for evaluation. °- Ambulatory referral to Dermatology ° °5. Current smoker °Patient has been advised of the health risks of smoking and counseled concerning cessation of tobacco products. I spent over 3 minutes for discussion and to answer questions.  ° °6. BMI 30.0-30.9,adult °Health risks of being over weight were discussed and patient was counseled on weight loss options and exercise.  °- Lipid Panel With LDL/HDL Ratio ° °7. Sarcoidosis °Patient with a history of sarcoidosis in his lungs and given that he is got a productive cough presumably from upper respiratory however as well as a low-grade fever we will cover with antibiotic given that we do not want exacerbation into a lower respiratory illness. ° °8. Fever, unspecified fever cause °Fever as noted above we will check influenza and if negative likely treat bacterial given that symptoms and the physical signs suggesting sinusitis. °- POCT Influenza A/B  ° ° °

## 2021-02-20 LAB — LIPID PANEL WITH LDL/HDL RATIO
Cholesterol, Total: 203 mg/dL — ABNORMAL HIGH (ref 100–199)
HDL: 37 mg/dL — ABNORMAL LOW (ref >39)
LDL Chol Calc (NIH): 136 mg/dL — ABNORMAL HIGH (ref 0–99)
LDL/HDL Ratio: 3.7 ratio — ABNORMAL HIGH (ref 0.0–3.6)
Triglycerides: 168 mg/dL — ABNORMAL HIGH (ref 0–149)
VLDL Cholesterol Cal: 30 mg/dL (ref 5–40)

## 2021-02-23 DIAGNOSIS — G629 Polyneuropathy, unspecified: Secondary | ICD-10-CM | POA: Diagnosis not present

## 2021-02-23 DIAGNOSIS — M542 Cervicalgia: Secondary | ICD-10-CM | POA: Diagnosis not present

## 2021-02-23 DIAGNOSIS — M79671 Pain in right foot: Secondary | ICD-10-CM | POA: Diagnosis not present

## 2021-02-23 DIAGNOSIS — M79672 Pain in left foot: Secondary | ICD-10-CM | POA: Diagnosis not present

## 2021-02-23 DIAGNOSIS — M545 Low back pain, unspecified: Secondary | ICD-10-CM | POA: Diagnosis not present

## 2021-02-23 DIAGNOSIS — M722 Plantar fascial fibromatosis: Secondary | ICD-10-CM | POA: Diagnosis not present

## 2021-02-23 DIAGNOSIS — E559 Vitamin D deficiency, unspecified: Secondary | ICD-10-CM | POA: Diagnosis not present

## 2021-02-23 DIAGNOSIS — R202 Paresthesia of skin: Secondary | ICD-10-CM | POA: Diagnosis not present

## 2021-02-23 DIAGNOSIS — E538 Deficiency of other specified B group vitamins: Secondary | ICD-10-CM | POA: Diagnosis not present

## 2021-02-23 DIAGNOSIS — R200 Anesthesia of skin: Secondary | ICD-10-CM | POA: Diagnosis not present

## 2021-02-23 DIAGNOSIS — G8929 Other chronic pain: Secondary | ICD-10-CM | POA: Diagnosis not present

## 2021-02-23 DIAGNOSIS — R5383 Other fatigue: Secondary | ICD-10-CM | POA: Diagnosis not present

## 2021-02-24 ENCOUNTER — Ambulatory Visit: Payer: Self-pay | Admitting: *Deleted

## 2021-02-24 ENCOUNTER — Telehealth: Payer: Self-pay | Admitting: Family Medicine

## 2021-02-24 NOTE — Telephone Encounter (Signed)
Copied from [place] [id number]. Topic: General - Inquiry >> Feb 24, 2021  1:51 PM Scherrie Gerlach wrote: Reason for CRM: wife is calling to ask if OK for pt to keep 05/24/21 appt for a follow up? What vitamins should pt take that will not interact with his bp meds.?

## 2021-02-24 NOTE — Telephone Encounter (Signed)
Opened chart to answer a question.   Did not need to speak with pt.

## 2021-02-24 NOTE — Telephone Encounter (Signed)
Spoke to pt let him know not to take tumeric while taking BP medication. Pt verbalized understanding.  KP

## 2021-02-25 ENCOUNTER — Telehealth: Payer: Self-pay

## 2021-02-25 ENCOUNTER — Encounter: Payer: Self-pay | Admitting: Family Medicine

## 2021-02-26 NOTE — Telephone Encounter (Signed)
Error.     KP 

## 2021-02-27 ENCOUNTER — Other Ambulatory Visit: Payer: Self-pay | Admitting: Family Medicine

## 2021-02-27 DIAGNOSIS — I10 Essential (primary) hypertension: Secondary | ICD-10-CM

## 2021-02-27 NOTE — Telephone Encounter (Signed)
Requested Prescriptions  Pending Prescriptions Disp Refills   amLODipine (NORVASC) 10 MG tablet [Pharmacy Med Name: AMLODIPINE BESYLATE 10MG  TABLETS] 90 tablet 1    Sig: TAKE 1 TABLET BY MOUTH EVERY MORNING     Cardiovascular:  Calcium Channel Blockers Passed - 02/27/2021  3:35 AM      Passed - Last BP in normal range    BP Readings from Last 1 Encounters:  02/19/21 124/76         Passed - Valid encounter within last 6 months    Recent Outpatient Visits          1 week ago Essential hypertension   [place], MD   3 months ago Plantar fasciitis, right   [facility] Montel Culver, MD   5 months ago [facility] Montel Culver, MD   6 months ago Plantar fasciitis, right   [facility] Montel Culver, MD   6 months ago Essential hypertension   [place], Deanna C, MD      Future Appointments            In 2 months End, Harrell Gave, MD [facility], [facility]   In 2 months Juline Patch, MD [facility], [facility]

## 2021-03-03 DIAGNOSIS — F331 Major depressive disorder, recurrent, moderate: Secondary | ICD-10-CM | POA: Diagnosis not present

## 2021-03-09 ENCOUNTER — Encounter: Payer: Self-pay | Admitting: Dermatology

## 2021-03-09 ENCOUNTER — Ambulatory Visit: Payer: Medicaid Other | Admitting: Dermatology

## 2021-03-09 ENCOUNTER — Other Ambulatory Visit: Payer: Self-pay

## 2021-03-09 DIAGNOSIS — D485 Neoplasm of uncertain behavior of skin: Secondary | ICD-10-CM

## 2021-03-09 DIAGNOSIS — L219 Seborrheic dermatitis, unspecified: Secondary | ICD-10-CM | POA: Diagnosis not present

## 2021-03-09 DIAGNOSIS — L818 Other specified disorders of pigmentation: Secondary | ICD-10-CM

## 2021-03-09 DIAGNOSIS — D489 Neoplasm of uncertain behavior, unspecified: Secondary | ICD-10-CM

## 2021-03-09 DIAGNOSIS — B360 Pityriasis versicolor: Secondary | ICD-10-CM

## 2021-03-09 MED ORDER — KETOCONAZOLE 2 % EX CREA: 1.0000 "application " | TOPICAL_CREAM | CUTANEOUS | 11 refills | Status: AC

## 2021-03-09 MED ORDER — KETOCONAZOLE 2 % EX SHAM: 1.0000 "application " | MEDICATED_SHAMPOO | Freq: Once | CUTANEOUS | 11 refills | Status: AC

## 2021-03-09 MED ORDER — HYDROCORTISONE 2.5 % EX CREA: TOPICAL_CREAM | CUTANEOUS | 3 refills | Status: DC

## 2021-03-09 NOTE — Patient Instructions (Addendum)
Seborrheic Dermatitis  Mix hydrocortisone with ketaconazole 2% twice a day. If improved, decrease to hydrocortisone and ketaconazole mixed once a day. If still clear, decrease to ketaconazole only.     Biopsy Wound Care Instructions  Leave the original bandage on for 24 hours if possible.  If the bandage becomes soaked or soiled before that time, it is OK to remove it and examine the wound.  A small amount of post-operative bleeding is normal.  If excessive bleeding occurs, remove the bandage, place gauze over the site and apply continuous pressure (no peeking) over the area for 30 minutes. If this does not work, please call our clinic as soon as possible or page your doctor if it is after hours.   Once a day, cleanse the wound with soap and water. It is fine to shower. If a thick crust develops you may use a Q-tip dipped into dilute hydrogen peroxide (mix 1:1 with water) to dissolve it.  Hydrogen peroxide can slow the healing process, so use it only as needed.    After washing, apply petroleum jelly (Vaseline) or an antibiotic ointment if your doctor prescribed one for you, followed by a bandage.    For best healing, the wound should be covered with a layer of ointment at all times. If you are not able to keep the area covered with a bandage to hold the ointment in place, this may mean re-applying the ointment several times a day.  Continue this wound care until the wound has healed and is no longer open.   Itching and mild discomfort is normal during the healing process. However, if you develop pain or severe itching, please call our office.   If you have any discomfort, you can take Tylenol (acetaminophen) or ibuprofen as directed on the bottle. (Please do not take these if you have an allergy to them or cannot take them for another reason).  Some redness, tenderness and white or yellow material in the wound is normal healing.  If the area becomes very sore and red, or develops a thick  yellow-green material (pus), it may be infected; please notify us.    If you have stitches, return to clinic as directed to have the stitches removed. You will continue wound care for 2-3 days after the stitches are removed.   Wound healing continues for up to one year following surgery. It is not unusual to experience pain in the scar from time to time during the interval.  If the pain becomes severe or the scar thickens, you should notify the office.    A slight amount of redness in a scar is expected for the first six months.  After six months, the redness will fade and the scar will soften and fade.  The color difference becomes less noticeable with time.  If there are any problems, return for a post-op surgery check at your earliest convenience.  To improve the appearance of the scar, you can use silicone scar gel, cream, or sheets (such as Mederma or Serica) every night for up to one year. These are available over the counter (without a prescription).  Please call our office at [phone] for any questions or concerns.   Topical steroids (such as triamcinolone, fluocinolone, fluocinonide, mometasone, clobetasol, halobetasol, betamethasone, hydrocortisone) can cause thinning and lightening of the skin if they are used for too long in the same area. Your physician has selected the right strength medicine for your problem and area affected on the body. Please use  your medication only as directed by your physician to prevent side effects.   If You Need Anything After Your Visit  If you have any questions or concerns for your doctor, please call our main line at [phone] and press option 4 to reach your doctor's medical assistant. If no one answers, please leave a voicemail as directed and we will return your call as soon as possible. Messages left after 4 pm will be answered the following business day.   You may also send [place] a message via [place]. We typically respond to MyChart messages  within 1-2 business days.  For prescription refills, please ask your pharmacy to contact our office. Our fax number is [phone].  If you have an urgent issue when the clinic is closed that cannot wait until the next business day, you can page your doctor at the number below.    Please note that while we do our best to be available for urgent issues outside of office hours, we are not available 24/7.   If you have an urgent issue and are unable to reach [place], you may choose to seek medical care at your doctor's office, retail clinic, urgent care center, or emergency room.  If you have a medical emergency, please immediately call 911 or go to the emergency department.  Pager Numbers  - Dr. Nehemiah Massed: [phone]  - Dr. Laurence Ferrari: [phone]  - Dr. Nicole Kindred: [phone]  In the event of inclement weather, please call our main line at [phone] for an update on the status of any delays or closures.  Dermatology Medication Tips: Please keep the boxes that topical medications come in in order to help keep track of the instructions about where and how to use these. Pharmacies typically print the medication instructions only on the boxes and not directly on the medication tubes.   If your medication is too expensive, please contact our office at [phone] option 4 or send [place] a message through [place].   We are unable to tell what your co-pay for medications will be in advance as this is different depending on your insurance coverage. However, we may be able to find a substitute medication at lower cost or fill out paperwork to get insurance to cover a needed medication.   If a prior authorization is required to get your medication covered by your insurance company, please allow [place] 1-2 business days to complete this process.  Drug prices often vary depending on where the prescription is filled and some pharmacies may offer cheaper prices.  The website www.goodrx.com contains coupons for  medications through different pharmacies. The prices here do not account for what the cost may be with help from insurance (it may be cheaper with your insurance), but the website can give you the price if you did not use any insurance.  - You can print the associated coupon and take it with your prescription to the pharmacy.  - You may also stop by our office during regular business hours and pick up a GoodRx coupon card.  - If you need your prescription sent electronically to a different pharmacy, notify our office through [facility] or by phone at [phone] option 4.     Si Usted Necesita Algo Despus de Su Visita  Tambin puede enviarnos un mensaje a travs de Pharmacist, community. Por lo general respondemos a los mensajes de MyChart en el transcurso de 1 a 2 das hbiles.  Para renovar recetas, por favor pida a su farmacia que se ponga  en contacto con nuestra oficina. Harland Dingwall de fax es [place] [phone].  Si tiene un asunto urgente cuando la clnica est cerrada y que no puede esperar hasta el siguiente da hbil, puede llamar/localizar a su doctor(a) al nmero que aparece a continuacin.   Por favor, tenga en cuenta que aunque hacemos todo lo posible para estar disponibles para asuntos urgentes fuera del horario de [place], no estamos disponibles las 24 horas del da, los 7 das de la [place].   Si tiene un problema urgente y no puede comunicarse con nosotros, puede optar por buscar atencin mdica  en el consultorio de su doctor(a), en una clnica privada, en un centro de atencin urgente o en una sala de emergencias.  Si tiene Engineering geologist, por favor llame inmediatamente al 911 o vaya a la sala de emergencias.  Nmeros de bper  - Dr. Nehemiah Massed: [phone]  - Dra. Moye: [phone]  - Dra. Nicole Kindred: [phone]  En caso de inclemencias del [place], por favor llame a Johnsie Kindred principal al [phone] para una actualizacin sobre el [place] de cualquier retraso o  cierre.  Consejos para la medicacin en dermatologa: Por favor, guarde las cajas en las que vienen los medicamentos de uso tpico para ayudarle a seguir las instrucciones sobre dnde y cmo usarlos. Las farmacias generalmente imprimen las instrucciones del medicamento slo en las cajas y no directamente en los tubos del [place].   Si su medicamento es muy caro, por favor, pngase en contacto con Zigmund Daniel llamando al [phone] y presione la opcin 4 o envenos un mensaje a travs de Pharmacist, community.   No podemos decirle cul ser su copago por los medicamentos por adelantado ya que esto es diferente dependiendo de la cobertura de su seguro. Sin embargo, es posible que podamos encontrar un medicamento sustituto a Electrical engineer un formulario para que el seguro cubra el medicamento que se considera necesario.   Si se requiere una autorizacin previa para que su compaa de seguros [place] su medicamento, por favor permtanos de 1 a 2 das hbiles para completar este proceso.  Los precios de los medicamentos varan con frecuencia dependiendo del Environmental consultant de dnde se surte la receta y alguna farmacias pueden ofrecer precios ms baratos.  El sitio web www.goodrx.com tiene cupones para medicamentos de Airline pilot. Los precios aqu no tienen en cuenta lo que podra costar con la ayuda del seguro (puede ser ms barato con su seguro), pero el sitio web puede darle el precio si no utiliz Research scientist (physical sciences).  - Puede imprimir el cupn correspondiente y llevarlo con su receta a la farmacia.  - Tambin puede pasar por nuestra oficina durante el horario de atencin regular y Charity fundraiser una tarjeta de cupones de GoodRx.  - Si necesita que su receta se enve electrnicamente a una farmacia diferente, informe a nuestra oficina a travs de MyChart de [place] o por telfono llamando al [phone] y presione la opcin 4.

## 2021-03-09 NOTE — Progress Notes (Signed)
New Patient Visit  Subjective  Luis Mcknight is a 41 y.o. male who presents for the following: New Patient (Initial Visit) (Patient reports he has had a rash since childhood on chest but has spread to back. 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 Patient also reports he would like to discuss removing skin growth at right hip that has grown. He also would like to discuss some discoloration and itchy breakouts at face. ).    The following portions of the chart were reviewed this encounter and updated as appropriate:       Review of Systems:  No other skin or systemic complaints except as noted in HPI or Assessment and Plan.  Objective  Well appearing patient in no apparent distress; mood and affect are within normal limits.  A focused examination was performed including scalp, face, back, chest, left inguinal crease. Relevant physical exam findings are noted in the Assessment and Plan.  chest and back Multiple dyspigmented macules and patches with fine scale.   Head - Anterior (Face) Hyperpigmented patches on forehead and cheeks and hypopigmented patches on nose  left inguinal crease 8 mm fleshy papule     Assessment & Plan  Tinea versicolor chest and back  Start ketoconazole shampoo - apply to body when showering let sit several minutes prior to rinsing  Discussed pigmentation takes longer to return to baseline  Tinea versicolor is a chronic recurrent skin rash causing discolored scaly spots most commonly seen on back, chest, and/or shoulders.  It is generally asymptomatic. The rash is due to overgrowth of a common type of yeast present on everyone's skin and it is not contagious.  It tends to flare more in the summer due to increased sweating on trunk.  After rash is treated, the scaliness will resolve, but the discoloration will take longer to return to normal pigmentation. The periodic use of an OTC medicated soap/shampoo with zinc or selenium sulfide can be  helpful to prevent yeast overgrowth and recurrence.    Seborrheic dermatitis Head - Anterior (Face)  With postinflammatory pigmentary alteration   Seborrheic Dermatitis  -  is a chronic persistent rash characterized by pinkness and scaling most commonly of the mid face but also can occur on the scalp (dandruff), ears; mid chest, mid back and groin.  It tends to be exacerbated by stress and cooler weather.  People who have neurologic disease may experience new onset or exacerbation of existing seborrheic dermatitis.  The condition is not curable but treatable and can be controlled.  Start Mix hydrocortisone with ketaconazole 2% twice a day. If improved, decrease to hydrocortisone and ketaconazole mixed once a day. If still clear, decrease to ketaconazole only.  Start Ketoconazole 2 % shampoo -  apply topically qd to affected areas of back, scalp and face. Leave for a few minutes and rinse.    ketoconazole (NIZORAL) 2 % shampoo - Head - Anterior (Face) Apply 1 application topically once for 1 dose. Apply to affected areas of back , scalp and face. Leave for a few minutes and rinse  hydrocortisone 2.5 % cream - Head - Anterior (Face) Apply topically See admin instructions. Can mix with ketoconazole cream and use twice daily as needed for itchy flared areas. D/c when no longer flared.  ketoconazole (NIZORAL) 2 % cream - Head - Anterior (Face) Apply 1 application topically See admin instructions. Can use 1 - 2 times daily as needed for itchy flared areas of body. Can mix with hydrocortisone if  flared.  Neoplasm of uncertain behavior left inguinal crease  Epidermal / dermal shaving  Lesion diameter (cm):  0.8 Informed consent: discussed and consent obtained   Patient was prepped and draped in usual sterile fashion: Area prepped with alcohol. Anesthesia: the lesion was anesthetized in a standard fashion   Anesthetic:  1% lidocaine w/ epinephrine 1-100,000 buffered w/ 8.4%  NaHCO3 Instrument used: flexible razor blade   Hemostasis achieved with: pressure, aluminum chloride and electrodesiccation   Outcome: patient tolerated procedure well   Post-procedure details: wound care instructions given   Post-procedure details comment:  Ointment and small bandage applied.   Specimen 1 - Surgical pathology Differential Diagnosis: R/o irritated nevus vs irritated skin tag  Check Margins: No  R/o irritated nevus vs irritated skin tag   Return in about 2 months (around 05/07/2021) for  seb derm/TV f/up.  I, Ruthell Rummage, CMA, am acting as scribe for Brendolyn Patty, MD.  Documentation: I have reviewed the above documentation for accuracy and completeness, and I agree with the above.  Brendolyn Patty MD

## 2021-03-15 ENCOUNTER — Telehealth: Payer: Self-pay

## 2021-03-15 NOTE — Telephone Encounter (Signed)
-----   Message from Brendolyn Patty, MD sent at 03/15/2021  8:32 AM EST ----- Skin (M), left inguinal crease FIBROLIPOMA, IRRITATED  Benign - please call patient

## 2021-03-15 NOTE — Telephone Encounter (Signed)
Advised pt of bx results/sh ?

## 2021-04-02 DIAGNOSIS — G4733 Obstructive sleep apnea (adult) (pediatric): Secondary | ICD-10-CM | POA: Diagnosis not present

## 2021-04-16 IMAGING — CR DG CHEST 2V
2 series · 2 of 2 positions shown · non-contrast
Comparison: Chest radiograph November 21, 2018 and chest CT
January 14, 2019

CLINICAL DATA: Sarcoidosis

EXAM:
CHEST - 2 VIEW

[chest pa]
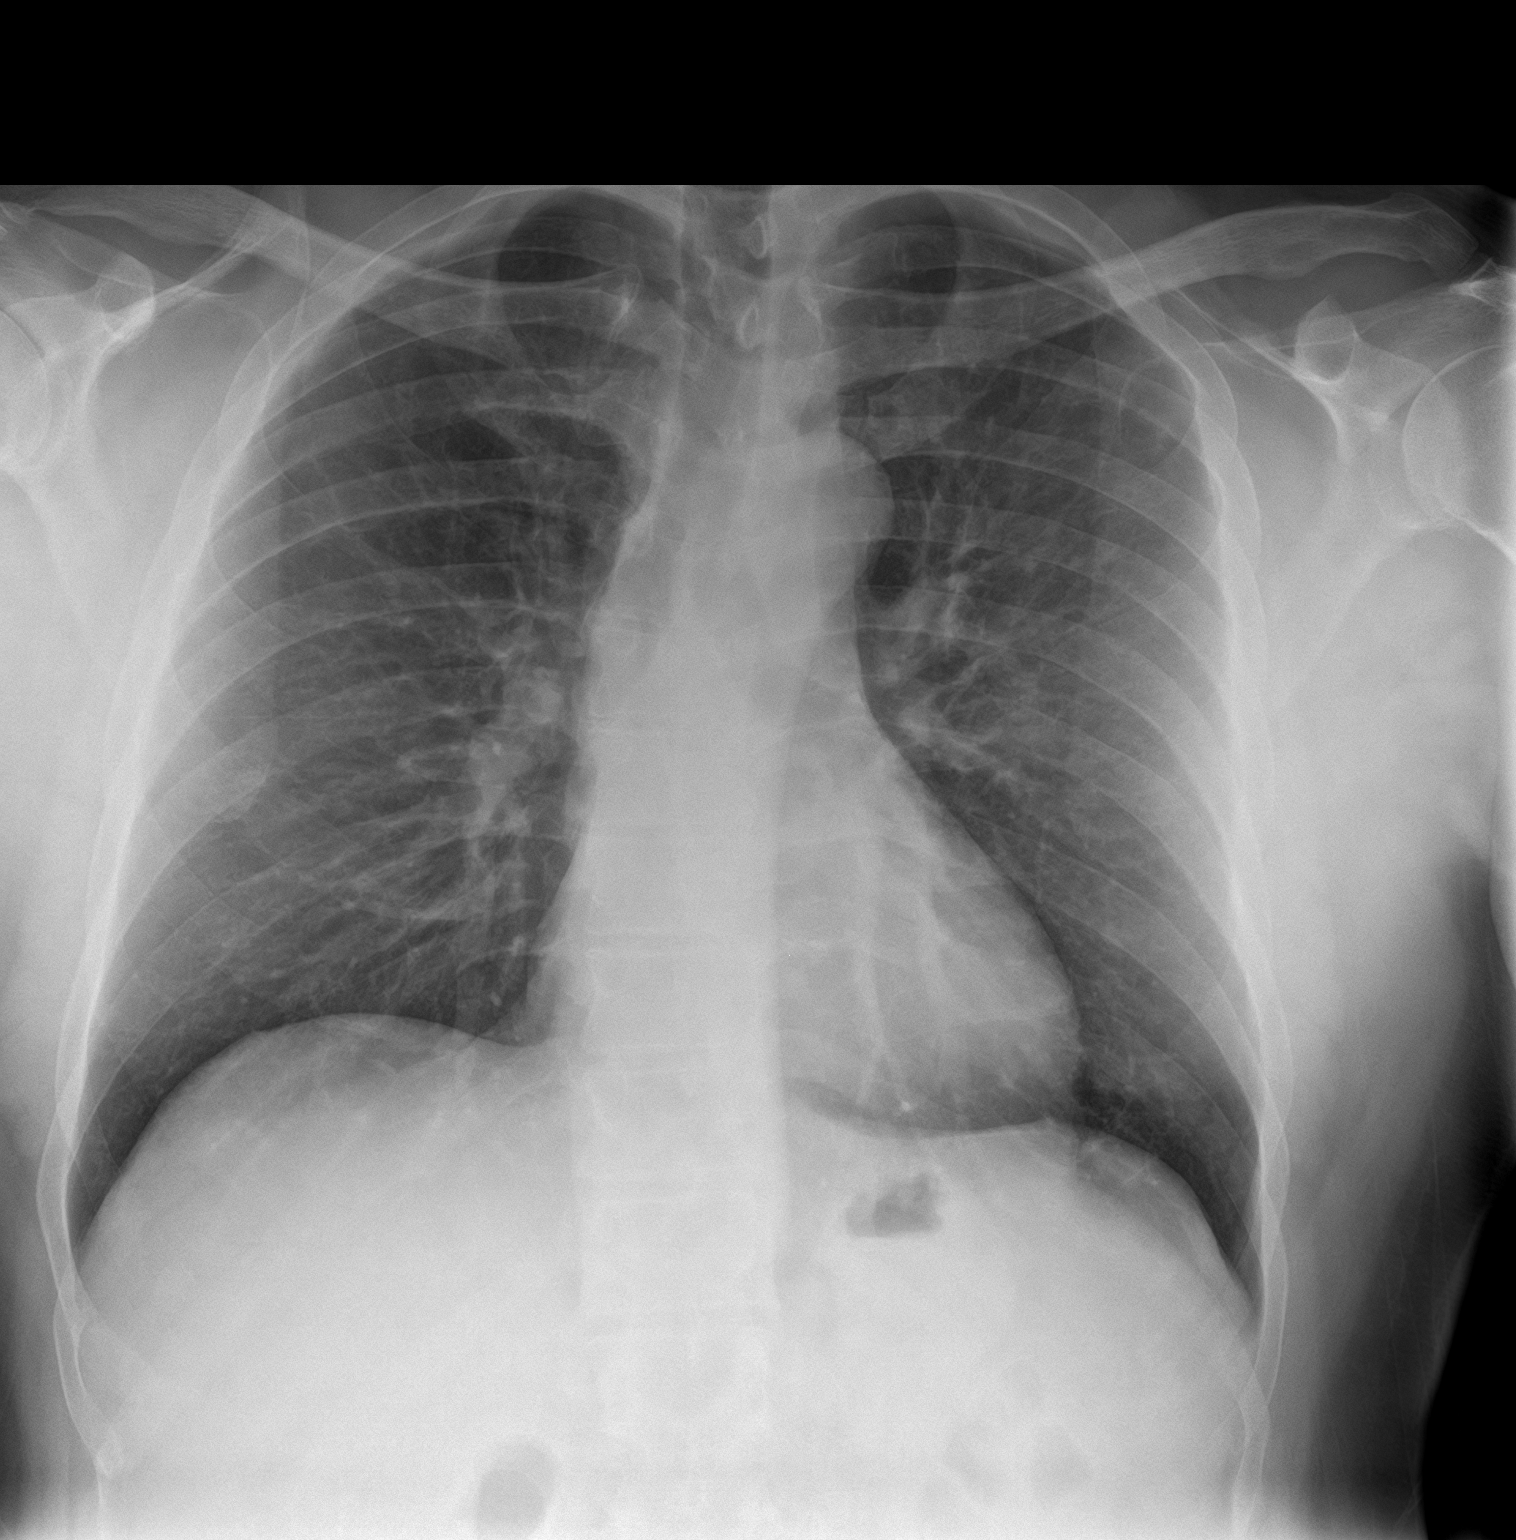

[chest lat]
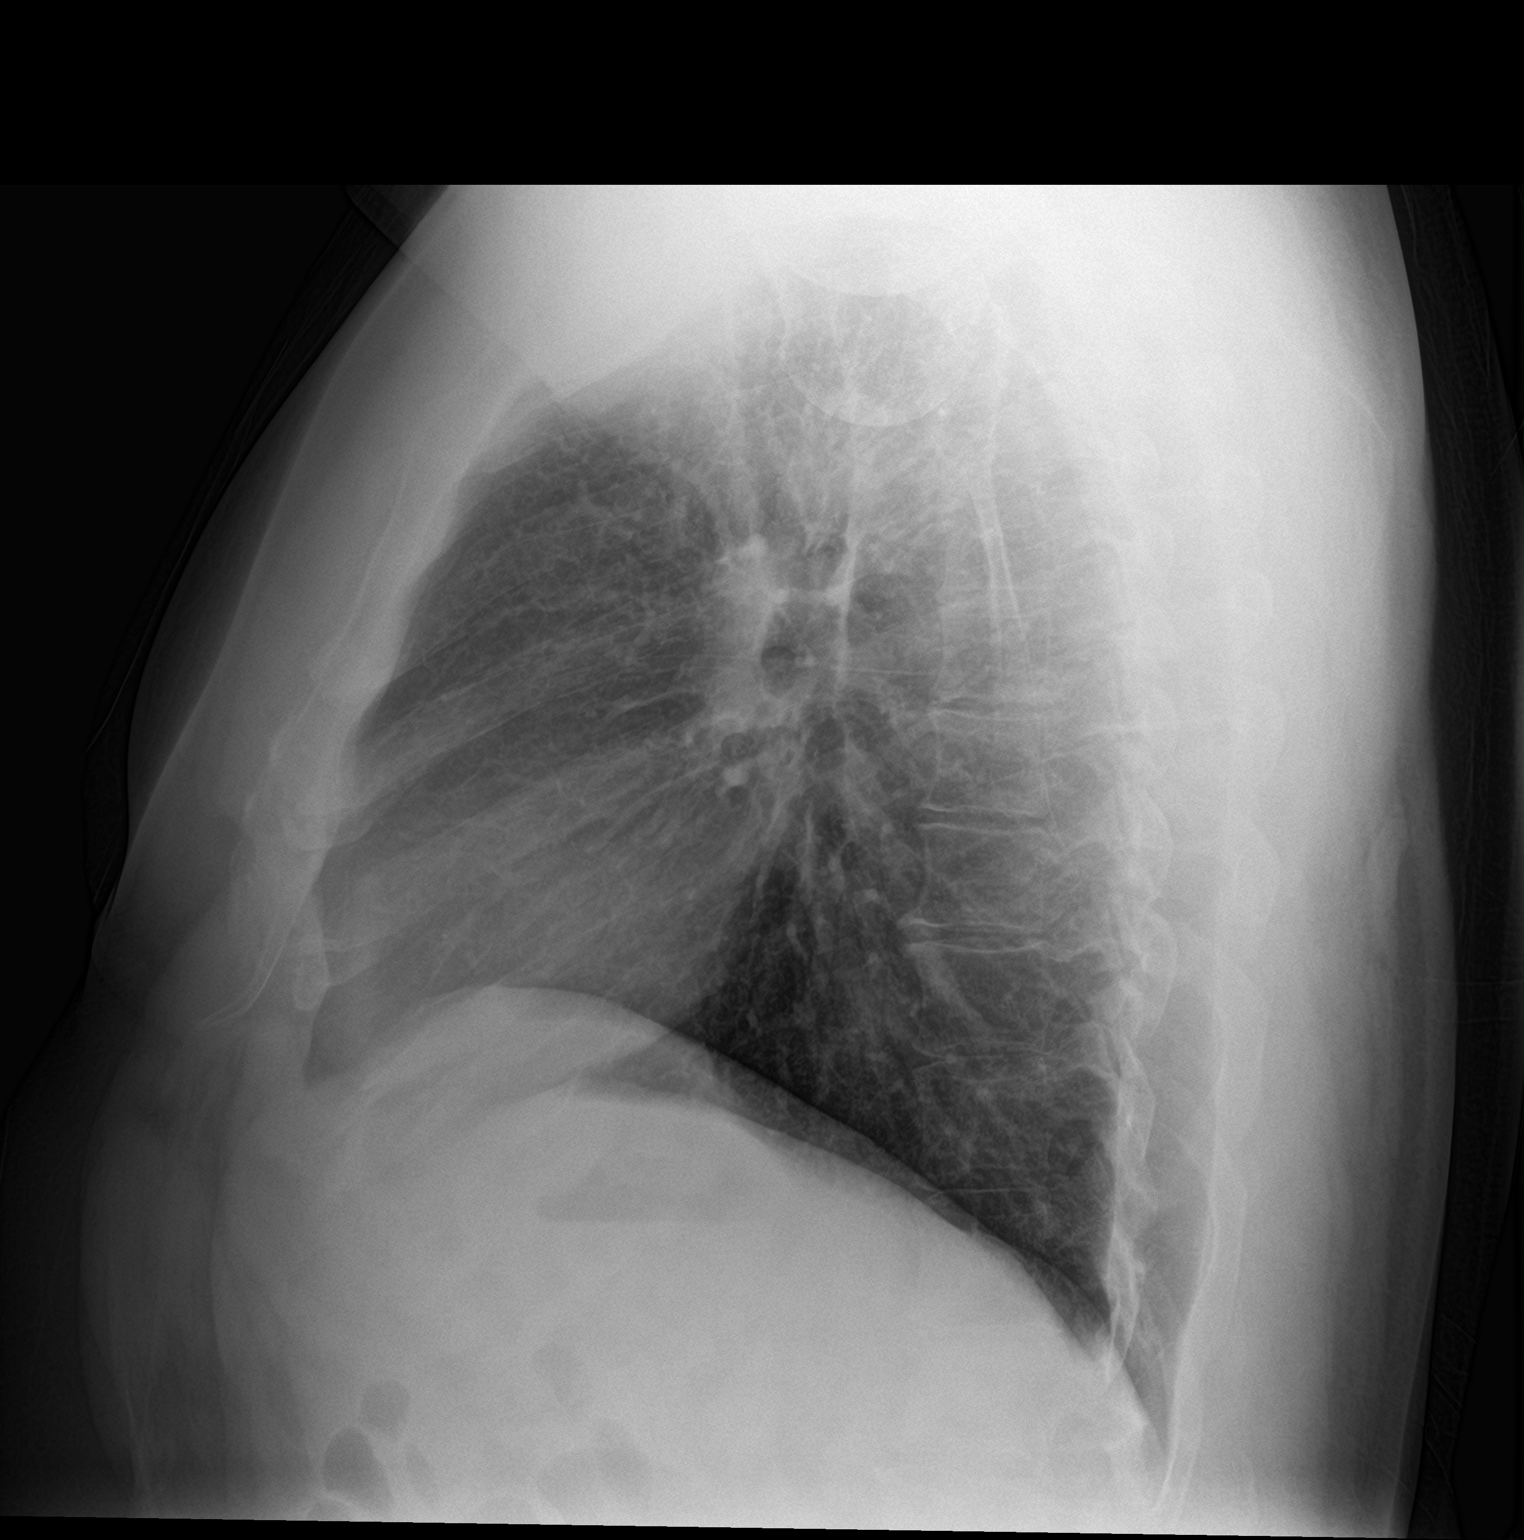

[2 of 2 positions shown; findings below may reference images not displayed]

FINDINGS: The previously noted airspace opacity throughout the left upper lobe
is no longer evident. Areas of slight interstitial thickening remain
in the left upper lobe. The lungs elsewhere are clear. Heart size
and pulmonary vascularity are normal. No adenopathy evident by
radiography. No bone lesions.
IMPRESSION: Interval clearing of infiltrate from the left upper lobe with
probable slight scarring remaining in this area. Lungs elsewhere
clear. No adenopathy is currently evident by radiography. Heart size
normal.

## 2021-05-07 ENCOUNTER — Ambulatory Visit: Payer: Medicaid Other | Admitting: Internal Medicine

## 2021-05-11 DIAGNOSIS — M79671 Pain in right foot: Secondary | ICD-10-CM | POA: Diagnosis not present

## 2021-05-11 DIAGNOSIS — R200 Anesthesia of skin: Secondary | ICD-10-CM | POA: Diagnosis not present

## 2021-05-11 DIAGNOSIS — G629 Polyneuropathy, unspecified: Secondary | ICD-10-CM | POA: Diagnosis not present

## 2021-05-11 DIAGNOSIS — M542 Cervicalgia: Secondary | ICD-10-CM | POA: Diagnosis not present

## 2021-05-12 ENCOUNTER — Encounter: Payer: Self-pay | Admitting: Internal Medicine

## 2021-05-12 ENCOUNTER — Other Ambulatory Visit: Payer: Self-pay

## 2021-05-12 ENCOUNTER — Ambulatory Visit: Payer: Medicaid Other | Admitting: Internal Medicine

## 2021-05-12 VITALS — BP 110/90 | HR 80 | Ht 74.0 in | Wt 255.0 lb

## 2021-05-12 DIAGNOSIS — Z8679 Personal history of other diseases of the circulatory system: Secondary | ICD-10-CM

## 2021-05-12 DIAGNOSIS — R002 Palpitations: Secondary | ICD-10-CM | POA: Diagnosis not present

## 2021-05-12 DIAGNOSIS — I10 Essential (primary) hypertension: Secondary | ICD-10-CM | POA: Diagnosis not present

## 2021-05-12 DIAGNOSIS — D869 Sarcoidosis, unspecified: Secondary | ICD-10-CM | POA: Diagnosis not present

## 2021-05-12 NOTE — Patient Instructions (Signed)
Medication Instructions:  ? ?Your physician recommends that you continue on your current medications as directed. Please refer to the Current Medication list given to you today. ? ?*If you need a refill on your cardiac medications before your next appointment, please call your pharmacy* ? ? ?Lab Work: ? ?None ordered ? ?Testing/Procedures: ? ?None ordered ? ? ?Follow-Up: ?At [facility], you and your health needs are our priority.  As part of our continuing mission to provide you with exceptional heart care, we have created designated Provider Care Teams.  These Care Teams include your primary Cardiologist (physician) and Advanced Practice Providers (APPs -  Physician Assistants and Nurse Practitioners) who all work together to provide you with the care you need, when you need it. ? ?We recommend signing up for the patient portal called "MyChart".  Sign up information is provided on this After Visit Summary.  MyChart is used to connect with patients for Virtual Visits (Telemedicine).  Patients are able to view lab/test results, encounter notes, upcoming appointments, etc.  Non-urgent messages can be sent to your provider as well.   ?To learn more about what you can do with MyChart, go to [email or web address].   ? ?Your next appointment:   ?1 year(s) ? ?The format for your next appointment:   ?In Person ? ?Provider:   ?You may see Nelva Bush, MD or one of the following Advanced Practice Providers on your designated Care Team:   ?Murray Hodgkins, NP ?Christell Faith, PA-C ?Cadence Kathlen Mody, PA-C ?

## 2021-05-12 NOTE — Progress Notes (Signed)
? ?Follow-up Outpatient Visit ?Date: 05/12/2021 ? ?Primary Care Provider: ?Juline Patch, MD ?[address] ?[place] [address] ? ?Chief Complaint: Follow-up pericarditis and sarcoidosis ? ?HPI:  Luis Mcknight is a 41 y.o. male with history of sarcoidosis, pericarditis, hypertension, arthritis, and GERD, who presents for follow-up of hypertension and prior pericarditis.  I last saw him in 11/2020, at which time he reported slight worsening of exertional dyspnea, which she attributed to weight gain and inactivity.  He also reported sporadic nonexertional chest pain.  We did not make any medication changes or pursue additional testing. ? ?Today, Nyra Capes reports that he has been feeling fairly well.  He still has sporadic left-sided chest pain, happening about once a week.  It is sharp and only last for few seconds.  It is not associated with exertion.  He also has rare palpitations.  They are most noticeable when he lies down at night.  He denies shortness of breath lightheadedness, and edema.  Home blood pressures are typically similar to today's reading in the office.  He received a new CPAP mask and has been using it regularly for the last 2 months.  [name] asks if there is anything he could do to help improve his energy. ? ?-------------------------------------------------------------------------------------------------- ? ?Past Medical History:  ?Diagnosis Date  ? Anxiety   ? Arthritis   ? Depression   ? NO MEDS  ? GERD (gastroesophageal reflux disease)   ? NO MEDS  ? Hypertension   ? OSA on CPAP   ? Pneumonia   ? JUNE 2020  ? Sleep apnea   ? ?Past Surgical History:  ?Procedure Laterality Date  ? ELECTROMAGNETIC NAVIGATION BROCHOSCOPY Left 10/08/2018  ? Procedure: ELECTROMAGNETIC NAVIGATION BRONCHOSCOPY, LEFT;  Surgeon: Tyler Pita, MD;  Location: [facility] ORS;  Service: Cardiopulmonary;  Laterality: Left;  ? ENDOBRONCHIAL ULTRASOUND Left 10/08/2018  ? Procedure: ENDOBRONCHIAL ULTRASOUND, LEFT;   Surgeon: Tyler Pita, MD;  Location: [facility] ORS;  Service: Cardiopulmonary;  Laterality: Left;  ? NO PAST SURGERIES    ? ? ?Current Meds  ?Medication Sig  ? acetaminophen (TYLENOL) 500 MG tablet Take 500-1,000 mg by mouth every 6 (six) hours as needed.  ? amitriptyline (ELAVIL) 50 MG tablet Take 1 tablet by mouth at bedtime.  ? amLODipine (NORVASC) 10 MG tablet TAKE 1 TABLET BY MOUTH EVERY MORNING  ? bisoprolol (ZEBETA) 5 MG tablet Take 1 tablet by mouth once daily  ? FLUoxetine (PROZAC) 20 MG capsule Take 20 mg by mouth daily.  ? hydrocortisone 2.5 % cream Apply topically See admin instructions. Can mix with ketoconazole cream and use twice daily as needed for itchy flared areas. D/c when no longer flared.  ? pantoprazole (PROTONIX) 20 MG tablet TAKE 1 TABLET(20 MG) BY MOUTH DAILY  ? predniSONE (DELTASONE) 10 MG tablet TAKE 1 TABLET(10 MG) BY MOUTH DAILY WITH BREAKFAST  ? pregabalin (LYRICA) 100 MG capsule Take 1 capsule (100 mg total) by mouth 3 (three) times daily.  ? triamterene-hydrochlorothiazide (MAXZIDE) 75-50 MG tablet Take 1 tablet by mouth daily.  ? UNABLE TO FIND CPAP AT NIGHT  ? ? ?Allergies: Latex ? ?Social History  ? ?Tobacco Use  ? Smoking status: Every Day  ?  Packs/day: 0.25  ?  Years: 20.00  ?  Pack years: 5.00  ?  Types: Cigarettes  ?  Start date: 03/07/1998  ? Smokeless tobacco: Never  ?Vaping Use  ? Vaping Use: Former  ? Start date: 04/18/2012  ? Quit date: 04/18/2016  ?  Substance Use Topics  ? Alcohol use: Not Currently  ? Drug use: Not Currently  ? ? ?Family History  ?Problem Relation Age of Onset  ? Hypertension Mother   ? Diabetes Mother   ? Diabetes Father   ? Hypertension Father   ? Heart attack Father 68  ? Lung cancer Maternal Grandmother   ? ? ?Review of Systems: ?A 12-system review of systems was performed and was negative except as noted in the HPI. ? ?-------------------------------------------------------------------------------------------------- ? ?Physical Exam: ?BP 110/90 (BP  Location: Left Arm, Patient Position: Sitting, Cuff Size: Large)   Pulse 80   Ht '6\' 2"'$  (1.88 m)   Wt 255 lb (115.7 kg)   SpO2 99%   BMI 32.74 kg/m?  ?Repeat BP: 122/76 ? ?General:  NAD. ?Neck: No JVD or HJR. ?Lungs: Clear to auscultation bilaterally without wheezes or crackles. ?Heart: Regular rate and rhythm without murmurs, rubs, or gallops. ?Abdomen: Soft, nontender, nondistended. ?Extremities: No lower extremity edema. ? ?EKG: Normal sinus rhythm with nonspecific T wave changes.  No significant change from prior tracing on 11/05/2020. ? ?Lab Results  ?Component Value Date  ? WBC 7.1 01/01/2019  ? HGB 14.7 01/01/2019  ? HCT 45.7 01/01/2019  ? MCV 80.3 01/01/2019  ? PLT 237 01/01/2019  ? ? ?Lab Results  ?Component Value Date  ? NA 136 08/18/2020  ? K 4.0 08/18/2020  ? CL 97 08/18/2020  ? CO2 22 08/18/2020  ? BUN 20 08/18/2020  ? CREATININE 1.19 08/18/2020  ? GLUCOSE 93 08/18/2020  ? ALT 52 (H) 01/01/2019  ? ? ?Lab Results  ?Component Value Date  ? CHOL 203 (H) 02/19/2021  ? HDL 37 (L) 02/19/2021  ? LDLCALC 136 (H) 02/19/2021  ? TRIG 168 (H) 02/19/2021  ? ? ?-------------------------------------------------------------------------------------------------- ? ?ASSESSMENT AND PLAN: ?History of pericarditis: ?Luis Mcknight reports sporadic brief left-sided chest pains not consistent with recurrent pericarditis.  EKG and physical exam also without findings to suggest pericarditis.  No further intervention planned at this time. ? ?Sarcoidosis: ?No evidence of conduction disease by EKG today.  Prior cardiac MRI without myocardial involvement.  Continue ongoing management per pulmonary. ? ?Palpitations: ?Episodes are brief and self-limited.  Continue current dose of bisoprolol. ? ?Hypertension: ?Blood pressure well controlled today.  Continue current regimen of amlodipine, bisoprolol, and triamterene-HCTZ. ? ?Follow-up: Return to clinic in 1 year. ? ?Nelva Bush, MD ?05/12/2021 ?9:45 AM ? ?

## 2021-05-17 ENCOUNTER — Ambulatory Visit: Payer: Medicaid Other | Admitting: Dermatology

## 2021-05-24 ENCOUNTER — Ambulatory Visit: Payer: Medicaid Other | Admitting: Family Medicine

## 2021-05-31 ENCOUNTER — Ambulatory Visit (INDEPENDENT_AMBULATORY_CARE_PROVIDER_SITE_OTHER): Payer: Medicaid Other | Admitting: Family Medicine

## 2021-05-31 ENCOUNTER — Encounter: Payer: Self-pay | Admitting: Family Medicine

## 2021-05-31 ENCOUNTER — Other Ambulatory Visit: Payer: Self-pay

## 2021-05-31 VITALS — BP 122/80 | HR 96 | Ht 74.0 in | Wt 256.0 lb

## 2021-05-31 DIAGNOSIS — R7303 Prediabetes: Secondary | ICD-10-CM | POA: Diagnosis not present

## 2021-05-31 DIAGNOSIS — I10 Essential (primary) hypertension: Secondary | ICD-10-CM | POA: Diagnosis not present

## 2021-05-31 DIAGNOSIS — K219 Gastro-esophageal reflux disease without esophagitis: Secondary | ICD-10-CM

## 2021-05-31 DIAGNOSIS — F17200 Nicotine dependence, unspecified, uncomplicated: Secondary | ICD-10-CM | POA: Diagnosis not present

## 2021-05-31 IMAGING — CR DG CHEST 2V
2 series · 3 of 3 positions shown · non-contrast
Comparison: 11/19/2019

CLINICAL DATA: Chest pain, cough, sarcoidosis

EXAM:
CHEST - 2 VIEW

[chest pa]
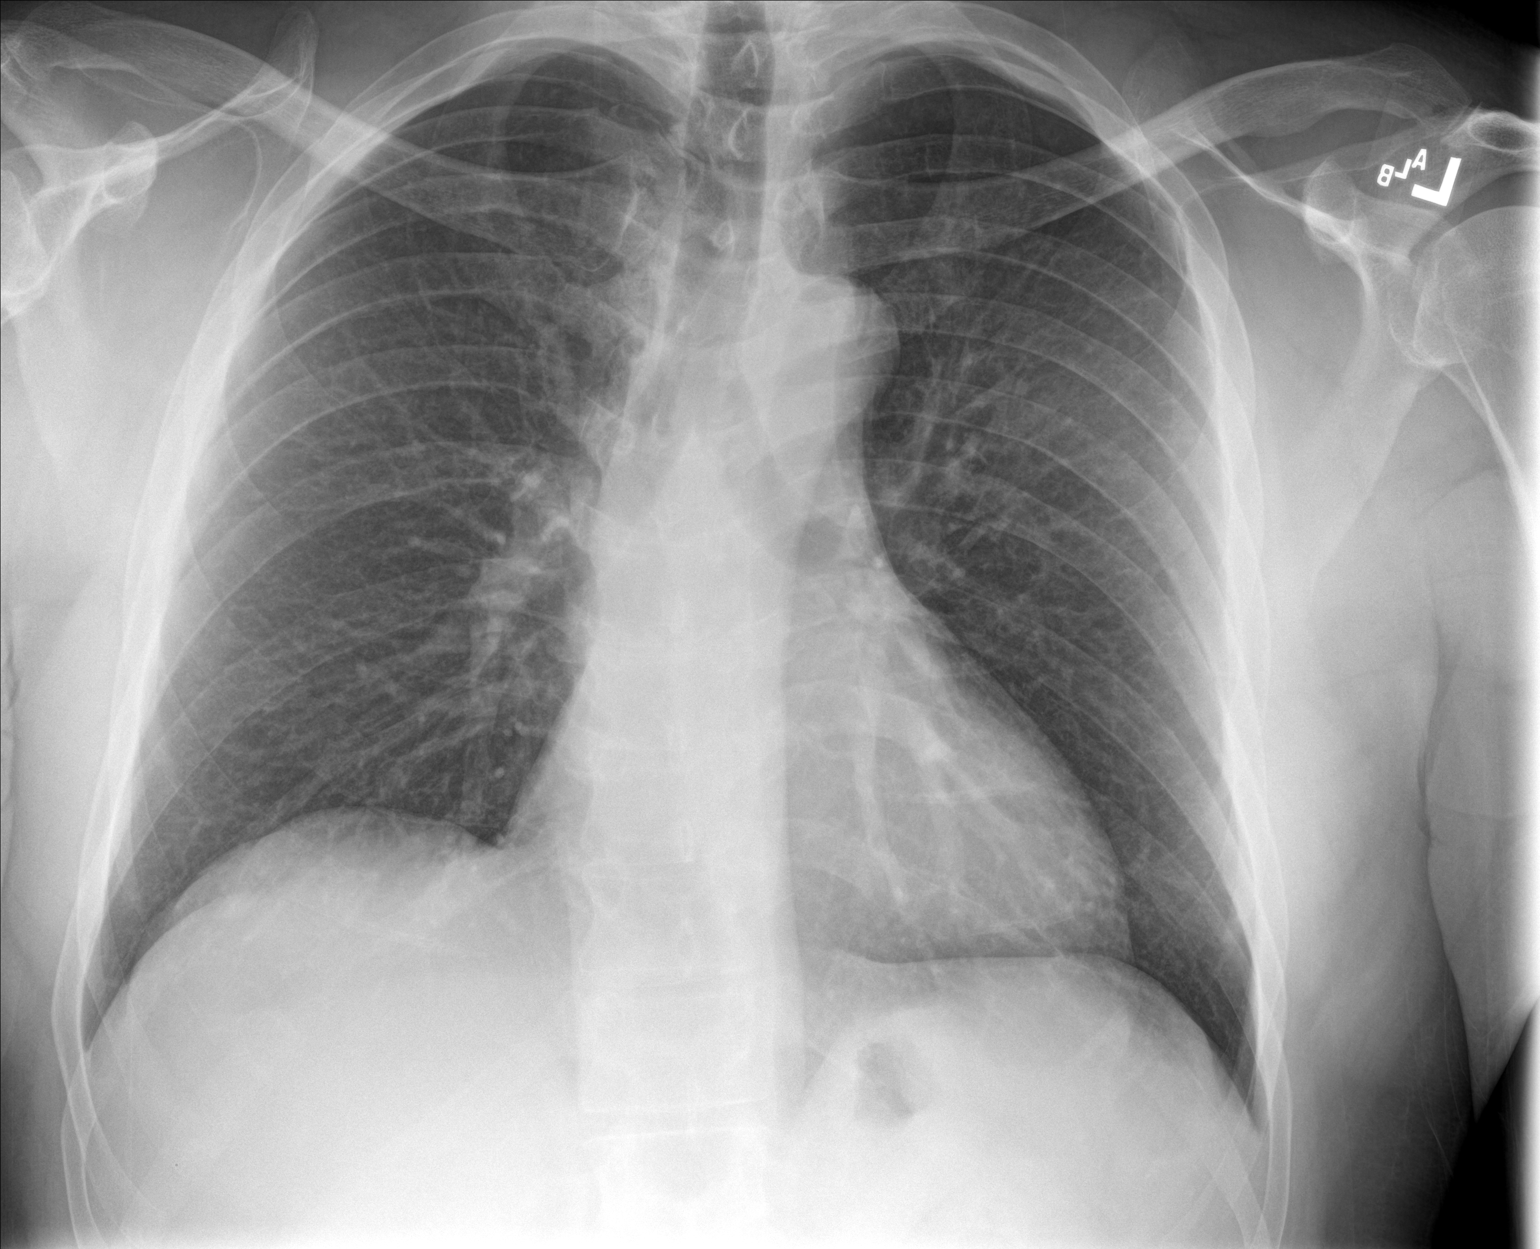

[Series 2: chest lat · 0.14mm/px · 2 of 2 slices shown]
[im 1/2]
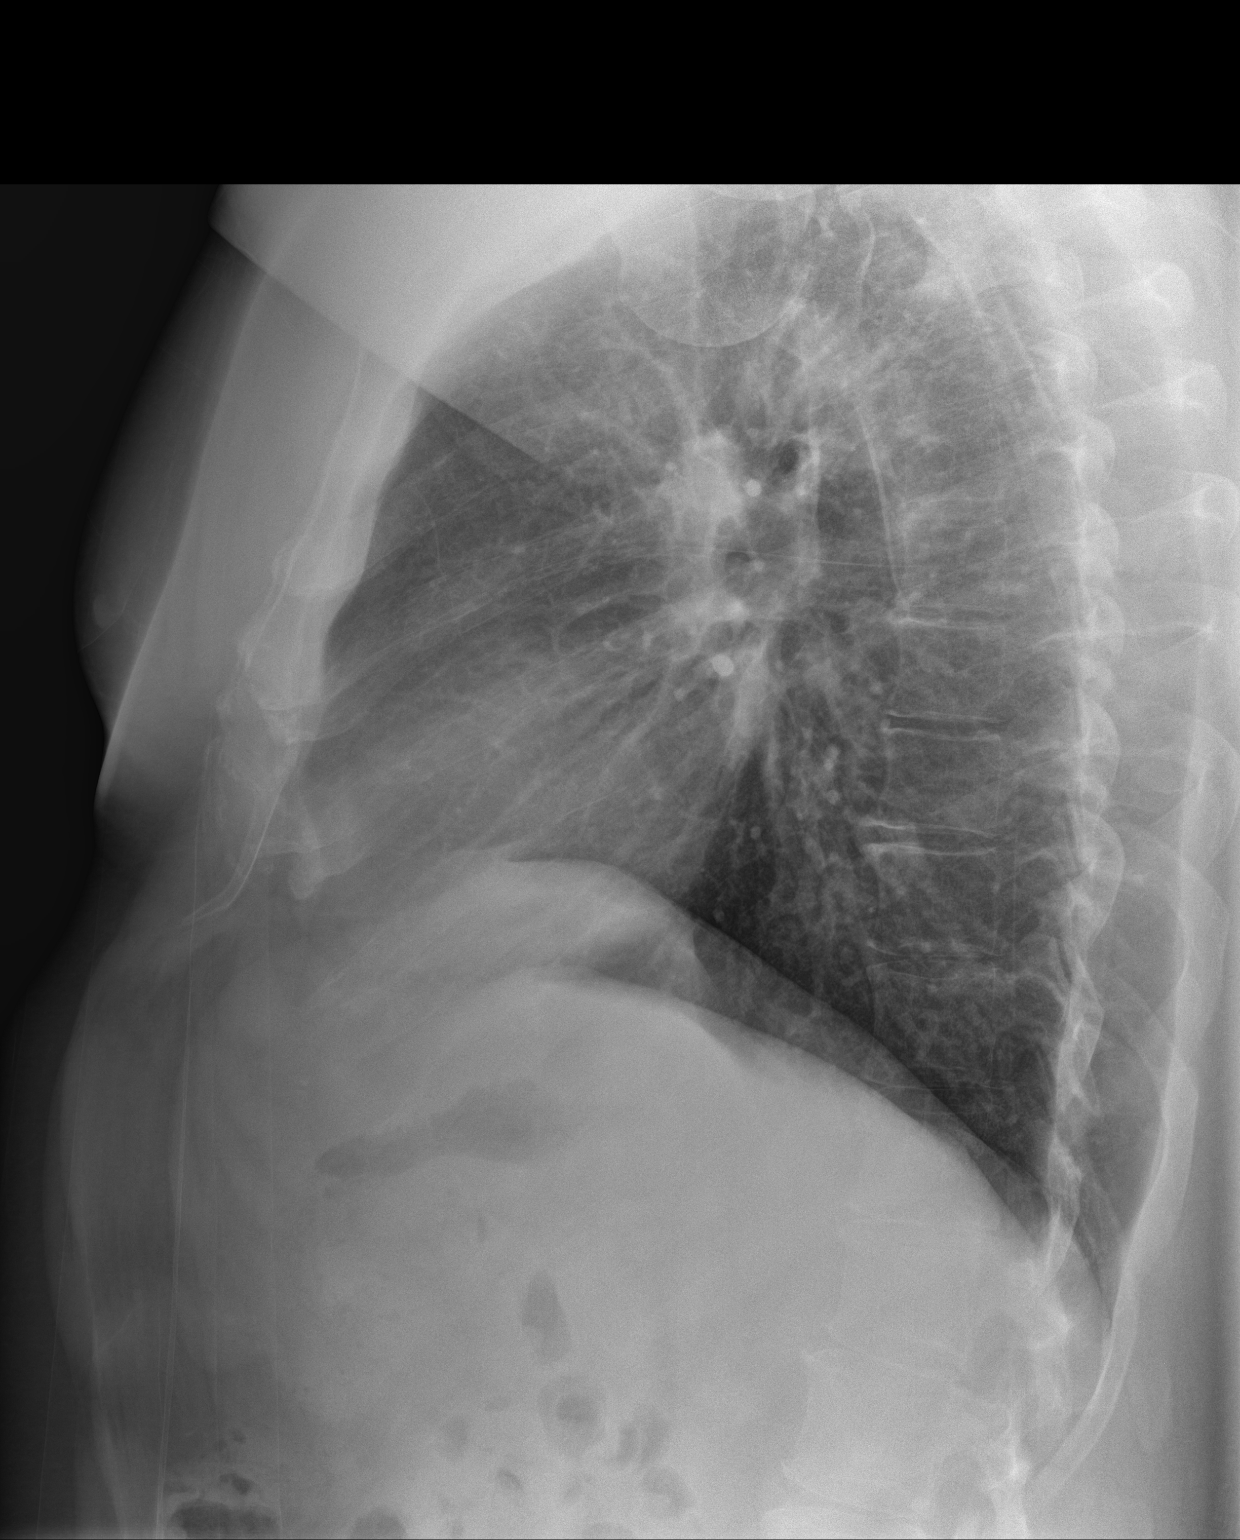
[im 2/2]
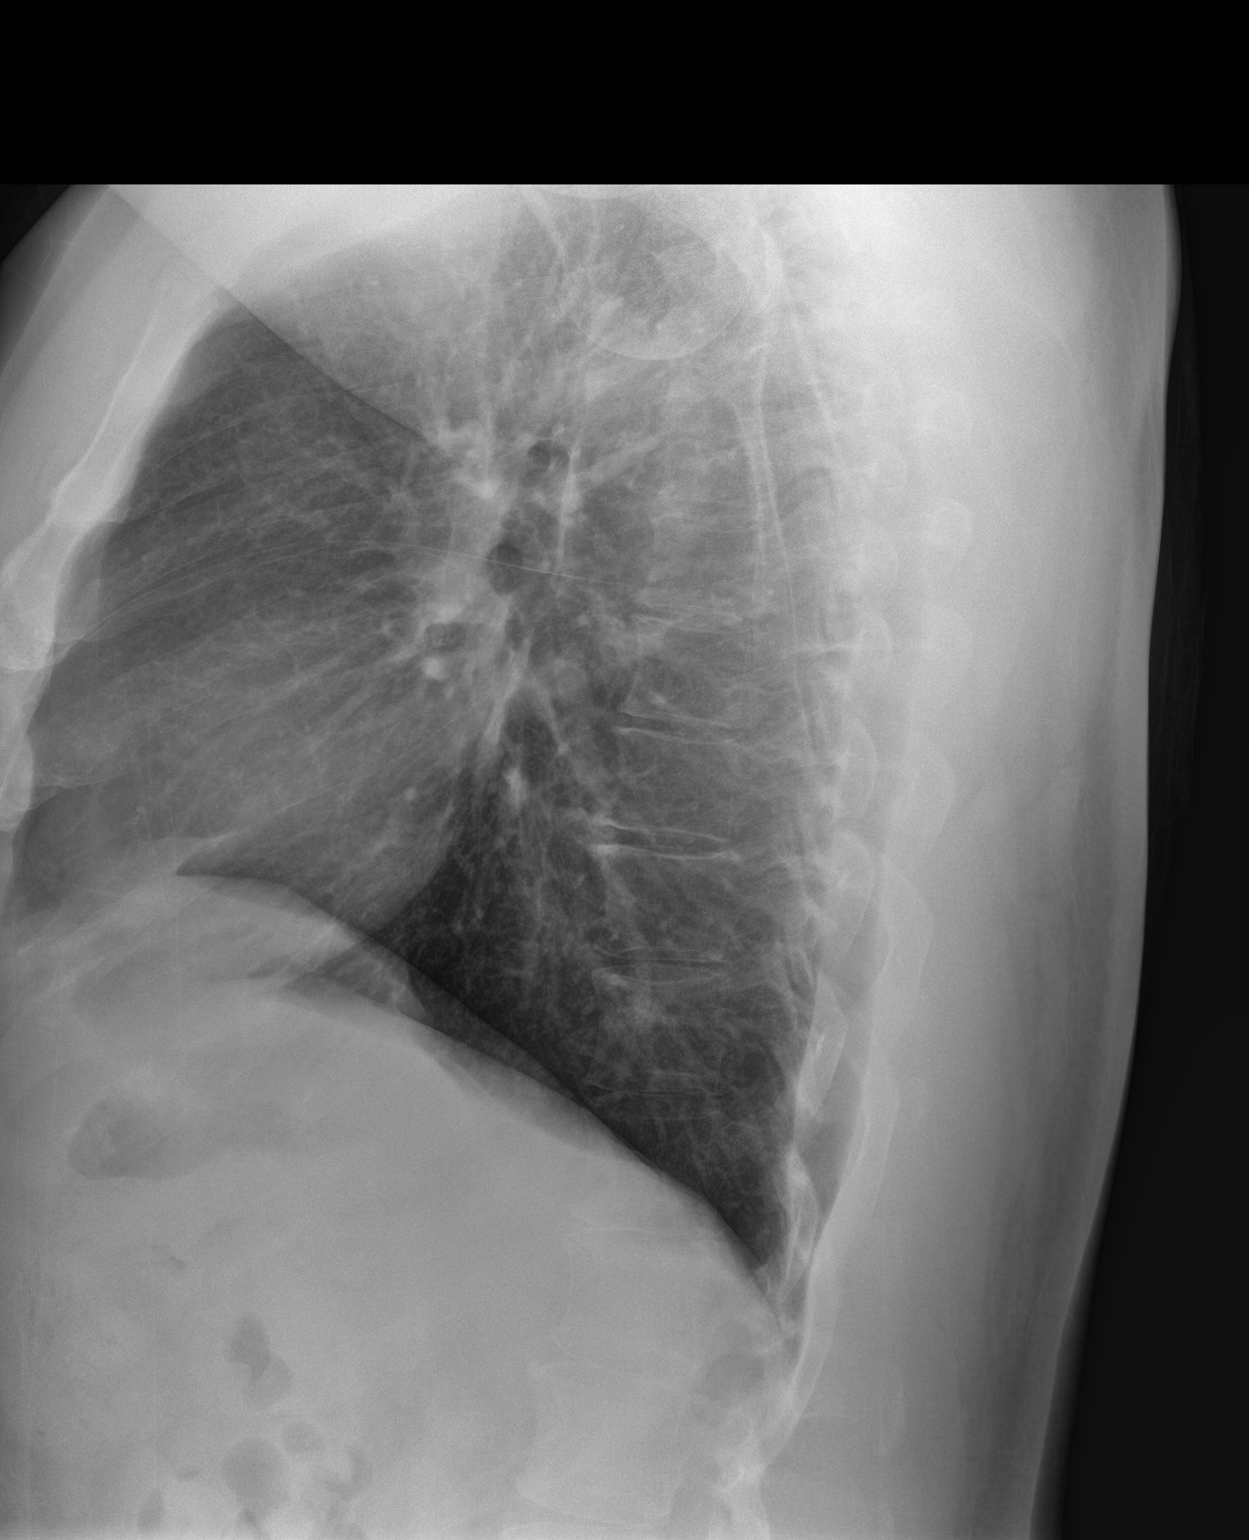

[3 of 3 positions shown; findings below may reference images not displayed]

FINDINGS: The heart size and mediastinal contours are within normal limits. No
new consolidation or edema. No pleural effusion or pneumothorax. Is
the visualized skeletal structures are unremarkable.
IMPRESSION: No acute process in the chest.

## 2021-05-31 MED ORDER — PANTOPRAZOLE SODIUM 20 MG PO TBEC: DELAYED_RELEASE_TABLET | ORAL | 0 refills | Status: DC

## 2021-05-31 NOTE — Progress Notes (Signed)
? ? ?Date:  05/31/2021  ? ?Name:  Luis Mcknight   DOB:  02/11/1981   MRN:  [id number] ? ? ?Chief Complaint: Gastroesophageal Reflux and new diabetic (Had A1C 6.5- has been watching diet since December- was to follow up with us) ? ?Gastroesophageal Reflux ?He reports no abdominal pain, no chest pain or no heartburn. This is a chronic problem. The problem occurs occasionally. The problem has been gradually improving. Nothing aggravates the symptoms.  ?Hypertension ?This is a chronic problem. The problem has been gradually improving since onset. The problem is controlled. Pertinent negatives include no blurred vision, chest pain, headaches, orthopnea, PND, shortness of breath or sweats. Risk factors for coronary artery disease include dyslipidemia. Past treatments include diuretics and calcium channel blockers. There are no compliance problems.  There is no history of angina, kidney disease, CAD/MI, CVA, heart failure, left ventricular hypertrophy, PVD or retinopathy. There is no history of chronic renal disease, a hypertension causing med or renovascular disease.  ?Diabetes ?He presents for his follow-up diabetic visit. Diabetes type: for prediabetes. His disease course has been stable. There are no hypoglycemic associated symptoms. Pertinent negatives for hypoglycemia include no confusion, dizziness, headaches, hunger, mood changes, nervousness/anxiousness, pallor, seizures, sleepiness, speech difficulty, sweats or tremors. Pertinent negatives for diabetes include no blurred vision, no chest pain, no polydipsia, no polyphagia and no polyuria. Symptoms are stable. There are no diabetic complications. Pertinent negatives for diabetic complications include no CVA, PVD or retinopathy. There are no known risk factors for coronary artery disease.  ? ?Lab Results  ?Component Value Date  ? NA 136 08/18/2020  ? K 4.0 08/18/2020  ? CO2 22 08/18/2020  ? GLUCOSE 93 08/18/2020  ? BUN 20 08/18/2020  ? CREATININE 1.19 08/18/2020  ?  CALCIUM 9.6 08/18/2020  ? EGFR 81 08/18/2020  ? GFRNONAA 99 04/03/2019  ? ?Lab Results  ?Component Value Date  ? CHOL 203 (H) 02/19/2021  ? HDL 37 (L) 02/19/2021  ? LDLCALC 136 (H) 02/19/2021  ? TRIG 168 (H) 02/19/2021  ? ?No results found for: TSH ?No results found for: HGBA1C ?Lab Results  ?Component Value Date  ? WBC 7.1 01/01/2019  ? HGB 14.7 01/01/2019  ? HCT 45.7 01/01/2019  ? MCV 80.3 01/01/2019  ? PLT 237 01/01/2019  ? ?Lab Results  ?Component Value Date  ? ALT 52 (H) 01/01/2019  ? AST 23 01/01/2019  ? ALKPHOS 101 01/01/2019  ? BILITOT 0.6 01/01/2019  ? ?No results found for: 25OHVITD2, 25OHVITD3, VD25OH  ? ?Review of Systems  ?Eyes:  Negative for blurred vision.  ?Respiratory:  Negative for shortness of breath.   ?Cardiovascular:  Negative for chest pain, orthopnea and PND.  ?Gastrointestinal:  Negative for abdominal pain and heartburn.  ?Endocrine: Negative for polydipsia, polyphagia and polyuria.  ?Skin:  Negative for pallor.  ?Neurological:  Negative for dizziness, tremors, seizures, speech difficulty and headaches.  ?Psychiatric/Behavioral:  Negative for confusion. The patient is not nervous/anxious.   ? ?Patient Active Problem List  ? Diagnosis Date Noted  ? Palpitations 05/12/2021  ? Psychophysiological insomnia 11/26/2020  ? History of pericarditis 11/06/2020  ? Metatarsalgia, right foot 08/28/2020  ? Adjustment reaction with anxiety and depression 08/28/2020  ? Fibromyalgia 08/28/2020  ? Plantar fasciitis, right 07/14/2020  ? Cervical spondylosis with radiculopathy 06/01/2020  ? Supraspinatus syndrome, 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  ? Lumbar spondylosis 06/01/2020  ? Plantar fasciitis, 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  ? Chest pain of uncertain etiology 09/04/2019  ? OSA (obstructive sleep apnea) 06/04/2019  ? Current smoker   06/04/2019  ? Healthcare maintenance 06/04/2019  ? Acute pericarditis 04/03/2019  ? Sarcoidosis 10/16/2018  ? Lung nodules   ? Adenopathy   ? Essential hypertension 11/28/2017  ? ? ?Allergies  ?Allergen  Reactions  ? Latex Itching  ?  GLOVES  ? ? ?Past Surgical History:  ?Procedure Laterality Date  ? ELECTROMAGNETIC NAVIGATION BROCHOSCOPY Left 10/08/2018  ? Procedure: ELECTROMAGNETIC NAVIGATION BRONCHOSCOPY, LEFT;  Surgeon: Tyler Pita, MD;  Location: [facility] ORS;  Service: Cardiopulmonary;  Laterality: Left;  ? ENDOBRONCHIAL ULTRASOUND Left 10/08/2018  ? Procedure: ENDOBRONCHIAL ULTRASOUND, LEFT;  Surgeon: Tyler Pita, MD;  Location: [facility] ORS;  Service: Cardiopulmonary;  Laterality: Left;  ? NO PAST SURGERIES    ? ? ?Social History  ? ?Tobacco Use  ? Smoking status: Every Day  ?  Packs/day: 0.25  ?  Years: 20.00  ?  Pack years: 5.00  ?  Types: Cigarettes  ?  Start date: 03/07/1998  ? Smokeless tobacco: Never  ?Vaping Use  ? Vaping Use: Former  ? Start date: 04/18/2012  ? Quit date: 04/18/2016  ?Substance Use Topics  ? Alcohol use: Not Currently  ? Drug use: Not Currently  ? ? ? ?Medication list has been reviewed and updated. ? ?Current Meds  ?Medication Sig  ? acetaminophen (TYLENOL) 500 MG tablet Take 500-1,000 mg by mouth every 6 (six) hours as needed.  ? amitriptyline (ELAVIL) 50 MG tablet Take 1 tablet by mouth at bedtime.  ? amLODipine (NORVASC) 10 MG tablet TAKE 1 TABLET BY MOUTH EVERY MORNING  ? bisoprolol (ZEBETA) 5 MG tablet Take 1 tablet by mouth once daily  ? FLUoxetine (PROZAC) 20 MG capsule Take 20 mg by mouth daily.  ? hydrocortisone 2.5 % cream Apply topically See admin instructions. Can mix with ketoconazole cream and use twice daily as needed for itchy flared areas. D/c when no longer flared.  ? predniSONE (DELTASONE) 10 MG tablet TAKE 1 TABLET(10 MG) BY MOUTH DAILY WITH BREAKFAST  ? pregabalin (LYRICA) 100 MG capsule Take 1 capsule (100 mg total) by mouth 3 (three) times daily. (Patient taking differently: Take 100 mg by mouth 3 (three) times daily. Melrose Nakayama)  ? triamterene-hydrochlorothiazide (MAXZIDE) 75-50 MG tablet Take 1 tablet by mouth daily.  ? UNABLE TO FIND CPAP AT NIGHT  ?  [DISCONTINUED] pantoprazole (PROTONIX) 20 MG tablet TAKE 1 TABLET(20 MG) BY MOUTH DAILY  ? ? ? ?  02/19/2021  ?  9:04 AM 11/26/2020  ? 11:46 AM 09/30/2020  ? 11:59 AM 08/28/2020  ? 11:33 AM  ?PHQ 2/9 Scores  ?PHQ - 2 Score _0 ?PHQ- 9 Score _1 ? ? ? ?  02/19/2021  ?  9:05 AM 11/26/2020  ? 11:47 AM 09/30/2020  ? 12:00 PM 08/28/2020  ? 11:33 AM  ?GAD 7 : Generalized Anxiety Score  ?Nervous, Anxious, on Edge _2 ?Control/stop worrying _3 ?Worry too much - different things _4 ?Trouble relaxing _5 ?Restless _6 ?Easily annoyed or irritable _7 ?Afraid - awful might happen _8 ?Total GAD 7 Score _9 ?Anxiety Difficulty Not difficult at all Very difficult Extremely difficult Very difficult  ? ? ?BP Readings from Last 3 Encounters:  ?05/31/21 122/80  ?05/12/21 110/90  ?02/19/21 124/76  ? ? ?Physical Exam ? ?Wt Readings from Last 3  Encounters:  ?05/31/21 256 lb (116.1 kg)  ?05/12/21 255 lb (115.7 kg)  ?02/19/21 246 lb (111.6 kg)  ? ? ?BP 122/80   Pulse 96   Ht 6' 2" (1.88 m)   Wt 256 lb (116.1 kg)   BMI 32.87 kg/m?  ? ?Assessment and Plan: ? ?1. Prediabetes ?Relatively new onset.  Persistent.  Last A1c is 6.5.  Patient returns and we will do an A1c and renal function panel today and he is not fasting. ?- Hemoglobin A1c ?- Renal Function Panel ? ?2. Essential hypertension ?Chronic.  Controlled.  Stable.  Currently on calcium channel blocker and diuretic.  Blood pressure today is 122/80 and will continue on current dosing. ?- Renal Function Panel ? ?3. Gastroesophageal reflux disease without esophagitis ?Chronic.  Controlled.  Stable.  Continue pantoprazole 20 mg once a day. ?- pantoprazole (PROTONIX) 20 MG tablet; TAKE 1 TABLET(20 MG) BY MOUTH DAILY  Dispense: 90 tablet; Refill: 0 ? ?4. Current smoker ?Patient has been advised of the health risks of smoking and counseled concerning cessation of tobacco products. I spent over 3 minutes for discussion and to answer  questions.   ? ? ?

## 2021-06-01 LAB — HEMOGLOBIN A1C
Est. average glucose Bld gHb Est-mCnc: 131 mg/dL
Hgb A1c MFr Bld: 6.2 % — ABNORMAL HIGH (ref 4.8–5.6)

## 2021-06-01 LAB — RENAL FUNCTION PANEL
Albumin: 4.3 g/dL (ref 4.0–5.0)
BUN/Creatinine Ratio: 11 (ref 9–20)
BUN: 12 mg/dL (ref 6–20)
CO2: 26 mmol/L (ref 20–29)
Calcium: 9.2 mg/dL (ref 8.7–10.2)
Chloride: 98 mmol/L (ref 96–106)
Creatinine, Ser: 1.13 mg/dL (ref 0.76–1.27)
Glucose: 125 mg/dL — ABNORMAL HIGH (ref 70–99)
Phosphorus: 2 mg/dL — ABNORMAL LOW (ref 2.8–4.1)
Potassium: 4 mmol/L (ref 3.5–5.2)
Sodium: 138 mmol/L (ref 134–144)
eGFR: 85 mL/min/{1.73_m2} (ref >59)

## 2021-06-10 DIAGNOSIS — F331 Major depressive disorder, recurrent, moderate: Secondary | ICD-10-CM | POA: Diagnosis not present

## 2021-06-10 DIAGNOSIS — F900 Attention-deficit hyperactivity disorder, predominantly inattentive type: Secondary | ICD-10-CM | POA: Diagnosis not present

## 2021-06-10 DIAGNOSIS — F411 Generalized anxiety disorder: Secondary | ICD-10-CM | POA: Diagnosis not present

## 2021-07-20 ENCOUNTER — Ambulatory Visit: Payer: Medicaid Other | Admitting: Dermatology

## 2021-08-03 ENCOUNTER — Other Ambulatory Visit: Payer: Self-pay | Admitting: Internal Medicine

## 2021-08-12 DIAGNOSIS — R202 Paresthesia of skin: Secondary | ICD-10-CM | POA: Diagnosis not present

## 2021-08-12 DIAGNOSIS — R200 Anesthesia of skin: Secondary | ICD-10-CM | POA: Diagnosis not present

## 2021-08-18 ENCOUNTER — Ambulatory Visit: Payer: Medicaid Other | Admitting: Dermatology

## 2021-08-18 DIAGNOSIS — B360 Pityriasis versicolor: Secondary | ICD-10-CM

## 2021-08-18 DIAGNOSIS — L219 Seborrheic dermatitis, unspecified: Secondary | ICD-10-CM | POA: Diagnosis not present

## 2021-08-18 DIAGNOSIS — R21 Rash and other nonspecific skin eruption: Secondary | ICD-10-CM

## 2021-08-18 MED ORDER — TRIAMCINOLONE ACETONIDE 0.1 % EX CREA: TOPICAL_CREAM | CUTANEOUS | 1 refills | Status: DC

## 2021-08-18 NOTE — Progress Notes (Signed)
Follow-Up Visit   Subjective  Luis Mcknight is a 41 y.o. male who presents for the following: tinea vesicolor (Follow up, prescribed ketoconazole shampoo to use at chest and back. Reports has improved. ), Other (Patient reports itchy bumps at b/l arms. ), and Seborrheic Dermatitis (Patient prescribed ketoconazole and hc 2.5 ointment mix to use along with ketoconazole shampoo. Patient report has improved but still some dryness. ). He feels like something is crawling on his skin of arms, but nothing is.   The patient has spots, moles and lesions to be evaluated, some may be new or changing and the patient has concerns that these could be cancer.   The following portions of the chart were reviewed this encounter and updated as appropriate:      Review of Systems: No other skin or systemic complaints except as noted in HPI or Assessment and Plan.   Objective  Well appearing patient in no apparent distress; mood and affect are within normal limits.  A focused examination was performed including face, chest, back, b/l arms, and b/l legs . Relevant physical exam findings are noted in the Assessment and Plan.  chest and back Mild hyperpigmentation   Scalp Light hypopigmented macules on cheeks with fine scale  b/l arms and b/l legs Follicular prominence on arms with mild xerosis  Hyperpigmented patch on b/l ankles    Assessment & Plan  Tinea versicolor chest and back  Chronic condition with duration or expected duration over one year. Currently well-controlled.   Continue as needed ketoconazole shampoo - apply to body when showering let sit several minutes prior to rinsing.  Recommend once weekly during summer months to prevent recurrence.   Tinea versicolor is a chronic recurrent skin rash causing discolored scaly spots most commonly seen on back, chest, and/or shoulders.  It is generally asymptomatic. The rash is due to overgrowth of a common type of yeast present on everyone's  skin and it is not contagious.  It tends to flare more in the summer due to increased sweating on trunk.  After rash is treated, the scaliness will resolve, but the discoloration will take longer to return to normal pigmentation. The periodic use of an OTC medicated soap/shampoo with zinc or selenium sulfide can be helpful to prevent yeast overgrowth and recurrence.  Seborrheic dermatitis Scalp  With postinflammatory pigmentary alteration, Chronic and persistent condition with duration or expected duration over one year. Condition is symptomatic/ bothersome to patient. Improving but not currently at goal.   Seborrheic Dermatitis  -  is a chronic persistent rash characterized by pinkness and scaling most commonly of the mid face but also can occur on the scalp (dandruff), ears; mid chest, mid back and groin.  It tends to be exacerbated by stress and cooler weather.  People who have neurologic disease may experience new onset or exacerbation of existing seborrheic dermatitis.  The condition is not curable but treatable and can be controlled.   Use only when flared Mix hydrocortisone with ketaconazole 2% twice a day. If improved, decrease to hydrocortisone and ketaconazole mixed once a day. If still clear, decrease to ketaconazole only.   Continue when needed  Ketoconazole 2 % shampoo -  apply topically qd to affected areas of back, scalp and face. Leave for a few minutes and rinse.    Related Medications hydrocortisone 2.5 % cream Apply topically See admin instructions. Can mix with ketoconazole cream and use twice daily as needed for itchy flared areas. D/c when no longer flared.  Rash and other nonspecific skin eruption b/l arms and b/l legs  Mild eczema with pruritus/paresthesias  Reports hx of sarcoidosis Denies hx of allergies, asthma   Start triamcinolone cream 0.1 % - apply topically to aa' of arms and legs bid for itchy rash. Avoid applying to face, groin, and axilla. Use as  directed. Long-term use can cause thinning of the skin.  Instructed to apply after shower to moist skin and before bedtime.   Recommend mild soap and moisturizing cream 1-2 times daily.  Gentle skin care handout  provided.   Topical steroids (such as triamcinolone, fluocinolone, fluocinonide, mometasone, clobetasol, halobetasol, betamethasone, hydrocortisone) can cause thinning and lightening of the skin if they are used for too long in the same area. Your physician has selected the right strength medicine for your problem and area affected on the body. Please use your medication only as directed by your physician to prevent side effects.    triamcinolone cream (KENALOG) 0.1 % - b/l arms and b/l legs Apply to aa of arms and legs bid for itchy rash. Avoid applying to face, groin, and axilla. Use as directed.   Return if symptoms worsen or fail to improve. I, Ruthell Rummage, CMA, am acting as scribe for Brendolyn Patty, MD.  Documentation: I have reviewed the above documentation for accuracy and completeness, and I agree with the above.  Brendolyn Patty MD

## 2021-08-18 NOTE — Patient Instructions (Addendum)
Start triamcinolone cream after shower and before bed to arms and legs for itchy rash. Can use for 2 weeks. If rash is getting better can decrease to using once daily. If rash has resolved can discontinue and only used when itchy.   Avoid applying to face, groin, and axilla. Use as directed. Long-term use can cause thinning of the skin. Topical steroids (such as triamcinolone, fluocinolone, fluocinonide, mometasone, clobetasol, halobetasol, betamethasone, hydrocortisone) can cause thinning and lightening of the skin if they are used for too long in the same area. Your physician has selected the right strength medicine for your problem and area affected on the body. Please use your medication only as directed by your physician to prevent side effects.    Recommend OTC Gold Bond Rapid Relief Anti-Itch cream (pramoxine + menthol), CeraVe Anti-itch cream or lotion (pramoxine), Sarna lotion (Original- menthol + camphor or Sensitive- pramoxine) or Eucerin 12 hour Itch Relief lotion (menthol) up to 3 times per day to areas on body that are itchy.   Gentle Skin Care Guide  1. Bathe no more than once a day.  2. Avoid bathing in hot water  3. Use a mild soap like Dove, Vanicream, Cetaphil, CeraVe. Can use Lever 2000 or Cetaphil antibacterial soap  4. Use soap only where you need it. On most days, use it under your arms, between your legs, and on your feet. Let the water rinse other areas unless visibly dirty.  5. When you get out of the bath/shower, use a towel to gently blot your skin dry, don't rub it.  6. While your skin is still a little damp, apply a moisturizing cream such as Vanicream, CeraVe, Cetaphil, Eucerin, Sarna lotion or plain Vaseline Jelly. For hands apply Neutrogena [place] Hand Cream or Excipial Hand Cream.  7. Reapply moisturizer any time you start to itch or feel dry.  8. Sometimes using free and clear laundry detergents can be helpful. Fabric softener sheets should be avoided.  Downy Free & Gentle liquid, or any liquid fabric softener that is free of dyes and perfumes, it acceptable to use  9. If your doctor has given you prescription creams you may apply moisturizers over them          Due to recent changes in healthcare laws, you may see results of your pathology and/or laboratory studies on MyChart before the doctors have had a chance to review them. We understand that in some cases there may be results that are confusing or concerning to you. Please understand that not all results are received at the same time and often the doctors may need to interpret multiple results in order to provide you with the best plan of care or course of treatment. Therefore, we ask that you please give [place] 2 business days to thoroughly review all your results before contacting the office for clarification. Should we see a critical lab result, you will be contacted sooner.   If You Need Anything After Your Visit  If you have any questions or concerns for your doctor, please call our main line at [phone] and press option 4 to reach your doctor's medical assistant. If no one answers, please leave a voicemail as directed and we will return your call as soon as possible. Messages left after 4 pm will be answered the following business day.   You may also send [place] a message via [place]. We typically respond to MyChart messages within 1-2 business days.  For prescription refills, please ask your pharmacy  to contact our office. Our fax number is [phone].  If you have an urgent issue when the clinic is closed that cannot wait until the next business day, you can page your doctor at the number below.    Please note that while we do our best to be available for urgent issues outside of office hours, we are not available 24/7.   If you have an urgent issue and are unable to reach [place], you may choose to seek medical care at your doctor's office, retail clinic, urgent care center, or  emergency room.  If you have a medical emergency, please immediately call 911 or go to the emergency department.  Pager Numbers  - Dr. Nehemiah Massed: [phone]  - Dr. Laurence Ferrari: [phone]  - Dr. Nicole Kindred: [phone]  In the event of inclement weather, please call our main line at [phone] for an update on the status of any delays or closures.  Dermatology Medication Tips: Please keep the boxes that topical medications come in in order to help keep track of the instructions about where and how to use these. Pharmacies typically print the medication instructions only on the boxes and not directly on the medication tubes.   If your medication is too expensive, please contact our office at [phone] option 4 or send [place] a message through [place].   We are unable to tell what your co-pay for medications will be in advance as this is different depending on your insurance coverage. However, we may be able to find a substitute medication at lower cost or fill out paperwork to get insurance to cover a needed medication.   If a prior authorization is required to get your medication covered by your insurance company, please allow [place] 1-2 business days to complete this process.  Drug prices often vary depending on where the prescription is filled and some pharmacies may offer cheaper prices.  The website www.goodrx.com contains coupons for medications through different pharmacies. The prices here do not account for what the cost may be with help from insurance (it may be cheaper with your insurance), but the website can give you the price if you did not use any insurance.  - You can print the associated coupon and take it with your prescription to the pharmacy.  - You may also stop by our office during regular business hours and pick up a GoodRx coupon card.  - If you need your prescription sent electronically to a different pharmacy, notify our office through [facility] or by phone at  [phone] option 4.     Si Usted Necesita Algo Despus de Su Visita  Tambin puede enviarnos un mensaje a travs de Pharmacist, community. Por lo general respondemos a los mensajes de MyChart en el transcurso de 1 a 2 das hbiles.  Para renovar recetas, por favor pida a su farmacia que se ponga en contacto con nuestra oficina. Harland Dingwall de fax es [place] [phone].  Si tiene un asunto urgente cuando la clnica est cerrada y que no puede esperar hasta el siguiente da hbil, puede llamar/localizar a su doctor(a) al nmero que aparece a continuacin.   Por favor, tenga en cuenta que aunque hacemos todo lo posible para estar disponibles para asuntos urgentes fuera del horario de [place], no estamos disponibles las 24 horas del da, los 7 das de la [place].   Si tiene un problema urgente y no puede comunicarse con nosotros, puede optar por buscar atencin mdica  en el consultorio de su doctor(a), en [facility]  clnica privada, en un centro de atencin urgente o en una sala de emergencias.  Si tiene Engineering geologist, por favor llame inmediatamente al 911 o vaya a la sala de emergencias.  Nmeros de bper  - Dr. Nehemiah Massed: [phone]  - Dra. Moye: [phone]  - Dra. Nicole Kindred: [phone]  En caso de inclemencias del [place], por favor llame a Johnsie Kindred principal al [phone] para una actualizacin sobre el [place] de cualquier retraso o cierre.  Consejos para la medicacin en dermatologa: Por favor, guarde las cajas en las que vienen los medicamentos de uso tpico para ayudarle a seguir las instrucciones sobre dnde y cmo usarlos. Las farmacias generalmente imprimen las instrucciones del medicamento slo en las cajas y no directamente en los tubos del [place].   Si su medicamento es muy caro, por favor, pngase en contacto con Zigmund Daniel llamando al [phone] y presione la opcin 4 o envenos un mensaje a travs de Pharmacist, community.   No podemos decirle cul ser su copago por los  medicamentos por adelantado ya que esto es diferente dependiendo de la cobertura de su seguro. Sin embargo, es posible que podamos encontrar un medicamento sustituto a Electrical engineer un formulario para que el seguro cubra el medicamento que se considera necesario.   Si se requiere una autorizacin previa para que su compaa de seguros [place] su medicamento, por favor permtanos de 1 a 2 das hbiles para completar este proceso.  Los precios de los medicamentos varan con frecuencia dependiendo del Environmental consultant de dnde se surte la receta y alguna farmacias pueden ofrecer precios ms baratos.  El sitio web www.goodrx.com tiene cupones para medicamentos de Airline pilot. Los precios aqu no tienen en cuenta lo que podra costar con la ayuda del seguro (puede ser ms barato con su seguro), pero el sitio web puede darle el precio si no utiliz Research scientist (physical sciences).  - Puede imprimir el cupn correspondiente y llevarlo con su receta a la farmacia.  - Tambin puede pasar por nuestra oficina durante el horario de atencin regular y Charity fundraiser una tarjeta de cupones de GoodRx.  - Si necesita que su receta se enve electrnicamente a una farmacia diferente, informe a nuestra oficina a travs de MyChart de [place] o por telfono llamando al [phone] y presione la opcin 4.

## 2021-08-19 ENCOUNTER — Other Ambulatory Visit: Payer: Self-pay | Admitting: Physician Assistant

## 2021-08-19 DIAGNOSIS — R519 Headache, unspecified: Secondary | ICD-10-CM

## 2021-08-23 ENCOUNTER — Other Ambulatory Visit: Payer: Self-pay

## 2021-08-23 DIAGNOSIS — I10 Essential (primary) hypertension: Secondary | ICD-10-CM

## 2021-08-23 MED ORDER — AMLODIPINE BESYLATE 10 MG PO TABS: 10.0000 mg | ORAL_TABLET | Freq: Every morning | ORAL | 0 refills | Status: DC

## 2021-08-23 MED ORDER — TRIAMTERENE-HCTZ 75-50 MG PO TABS: 1.0000 | ORAL_TABLET | Freq: Every day | ORAL | 0 refills | Status: DC

## 2021-08-24 ENCOUNTER — Telehealth: Payer: Self-pay | Admitting: Pulmonary Disease

## 2021-08-24 NOTE — Telephone Encounter (Signed)
Continue to hold prednisone as he has been off of it for 6 months.  Since it appears that he is having some productive cough he may want to be seen at urgent care so they can do a chest x-ray.

## 2021-08-24 NOTE — Telephone Encounter (Signed)
Patient last seen 12/18/19 and instructed to follow up in 59mo No pending appt.  According to last OV note, patient was to continue prednisone at '10mg'$  daily and possibly decrease dosage after rheumatology f/u.   Appt scheduled 11/04/2021. He stated that he has been without prednisone for 679moHe is wanting to know if he should resume it or continue off of it.  C/o Left side chest and back discomfort, mild SOB and prod cough with brown sputum mainly in the morning.   Dr. GoPatsey Bertholdplease advise. Thanks

## 2021-08-24 NOTE — Telephone Encounter (Signed)
Patient is aware of below message/recommendations and voiced his understanding.  Nothing further needed.  

## 2021-08-29 ENCOUNTER — Ambulatory Visit
Admission: RE | Admit: 2021-08-29 | Discharge: 2021-08-29 | Disposition: A | Discharge status: Home / Self Care | Payer: Medicaid Other | Source: Ambulatory Visit | Attending: Physician Assistant | Admitting: Physician Assistant

## 2021-08-29 DIAGNOSIS — M542 Cervicalgia: Secondary | ICD-10-CM | POA: Diagnosis not present

## 2021-08-29 DIAGNOSIS — R519 Headache, unspecified: Secondary | ICD-10-CM | POA: Diagnosis not present

## 2021-09-29 DIAGNOSIS — G4733 Obstructive sleep apnea (adult) (pediatric): Secondary | ICD-10-CM | POA: Diagnosis not present

## 2021-09-30 ENCOUNTER — Encounter: Payer: Self-pay | Admitting: Family Medicine

## 2021-09-30 ENCOUNTER — Ambulatory Visit (INDEPENDENT_AMBULATORY_CARE_PROVIDER_SITE_OTHER): Payer: Medicaid Other | Admitting: Family Medicine

## 2021-09-30 VITALS — BP 120/80 | HR 76 | Ht 74.0 in | Wt 250.0 lb

## 2021-09-30 DIAGNOSIS — R7303 Prediabetes: Secondary | ICD-10-CM | POA: Diagnosis not present

## 2021-09-30 NOTE — Progress Notes (Signed)
Date:  09/30/2021   Name:  Luis Mcknight   DOB:  01/13/1981   MRN:  [id number]   Chief Complaint: Prediabetes  Diabetes He presents for his follow-up diabetic visit. He has type 2 diabetes mellitus. His disease course has been stable. There are no hypoglycemic associated symptoms. Pertinent negatives for hypoglycemia include no dizziness, headaches or nervousness/anxiousness. There are no diabetic associated symptoms. Pertinent negatives for diabetes include no chest pain and no polydipsia. There are no hypoglycemic complications. Symptoms are stable. There are no diabetic complications. There are no known risk factors for coronary artery disease. He is compliant with treatment all of the time. He is following a generally healthy diet. When asked about meal planning, he reported none. He participates in exercise daily. There is no change in his home blood glucose trend. His breakfast blood glucose is taken between 8-9 am. His breakfast blood glucose range is generally 110-130 mg/dl. An ACE inhibitor/angiotensin II receptor blocker is not being taken.    Lab Results  Component Value Date   NA 138 05/31/2021   K 4.0 05/31/2021   CO2 26 05/31/2021   GLUCOSE 125 (H) 05/31/2021   BUN 12 05/31/2021   CREATININE 1.13 05/31/2021   CALCIUM 9.2 05/31/2021   EGFR 85 05/31/2021   GFRNONAA 99 04/03/2019   Lab Results  Component Value Date   CHOL 203 (H) 02/19/2021   HDL 37 (L) 02/19/2021   LDLCALC 136 (H) 02/19/2021   TRIG 168 (H) 02/19/2021   No results found for: "TSH" Lab Results  Component Value Date   HGBA1C 6.2 (H) 05/31/2021   Lab Results  Component Value Date   WBC 7.1 01/01/2019   HGB 14.7 01/01/2019   HCT 45.7 01/01/2019   MCV 80.3 01/01/2019   PLT 237 01/01/2019   Lab Results  Component Value Date   ALT 52 (H) 01/01/2019   AST 23 01/01/2019   ALKPHOS 101 01/01/2019   BILITOT 0.6 01/01/2019   No results found for: "25OHVITD2", "25OHVITD3", "VD25OH"   Review of  Systems  Constitutional:  Negative for chills and fever.  HENT:  Negative for drooling, ear discharge, ear pain and sore throat.   Respiratory:  Negative for cough, shortness of breath and wheezing.   Cardiovascular:  Negative for chest pain, palpitations and leg swelling.  Gastrointestinal:  Negative for abdominal pain, blood in stool, constipation, diarrhea and nausea.  Endocrine: Negative for polydipsia.  Genitourinary:  Negative for dysuria, frequency, hematuria and urgency.  Musculoskeletal:  Negative for back pain, myalgias and neck pain.  Skin:  Negative for rash.  Allergic/Immunologic: Negative for environmental allergies.  Neurological:  Negative for dizziness and headaches.  Hematological:  Does not bruise/bleed easily.  Psychiatric/Behavioral:  Negative for suicidal ideas. The patient is not nervous/anxious.     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   Lumbar spondylosis 06/01/2020   Plantar fasciitis, 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   Chest pain of uncertain etiology 06/00/4599   OSA (obstructive sleep apnea) 06/04/2019   Current smoker 06/04/2019   Healthcare maintenance 06/04/2019   Acute pericarditis 04/03/2019   Sarcoidosis 10/16/2018   Lung nodules    Adenopathy    Essential  hypertension 11/28/2017    Allergies  Allergen Reactions   Latex Itching    GLOVES    Past Surgical History:  Procedure Laterality Date   ELECTROMAGNETIC NAVIGATION BROCHOSCOPY Left 10/08/2018   Procedure: ELECTROMAGNETIC NAVIGATION BRONCHOSCOPY, LEFT;  Surgeon: Tyler Pita, MD;  Location: [facility] ORS;  Service:  Cardiopulmonary;  Laterality: Left;   ENDOBRONCHIAL ULTRASOUND Left 10/08/2018   Procedure: ENDOBRONCHIAL ULTRASOUND, LEFT;  Surgeon: Tyler Pita, MD;  Location: [facility] OR[facility];  Service: Cardiopulmonary;  Laterality: Left;   NO PAST SURGERIES      Social History   Tobacco Use   Smoking status: Every Day    Packs/day: 0.25    Years: 20.00    Total pack years: 5.00    Types: Cigarettes    Start date: 03/07/1998   Smokeless tobacco: Never  Vaping Use   Vaping Use: Former   Start date: 04/18/2012   Quit date: 04/18/2016  Substance Use Topics   Alcohol use: Not Currently   Drug use: Not Currently     Medication list has been reviewed and updated.  Current Meds  Medication Sig   amitriptyline (ELAVIL) 50 MG tablet Take 1 tablet by mouth at bedtime.   amLODipine (NORVASC) 10 MG tablet Take 1 tablet (10 mg total) by mouth every morning.   bisoprolol (ZEBETA) 5 MG tablet Take 1 tablet by mouth once daily   FLUoxetine (PROZAC) 40 MG capsule Take 40 mg by mouth every morning. psych   pantoprazole (PROTONIX) 20 MG tablet TAKE 1 TABLET(20 MG) BY MOUTH DAILY   pregabalin (LYRICA) 100 MG capsule Take 1 capsule (100 mg total) by mouth 3 (three) times daily. (Patient taking differently: Take 100 mg by mouth 3 (three) times daily. Melrose Nakayama)   triamcinolone cream (KENALOG) 0.1 % Apply to aa of arms and legs bid for itchy rash. Avoid applying to face, groin, and axilla. Use as directed.   triamterene-hydrochlorothiazide (MAXZIDE) 75-50 MG tablet Take 1 tablet by mouth daily.   UNABLE TO FIND CPAP AT NIGHT   [DISCONTINUED] FLUoxetine (PROZAC) 20 MG capsule Take 20 mg by mouth daily.       09/30/2021   10:29 AM 02/19/2021    9:05 AM 11/26/2020   11:47 AM 09/30/2020   12:00 PM  GAD 7 : Generalized Anxiety Score  Nervous, Anxious, on Edge _0 Control/stop worrying _1 Worry too much - different things _2 Trouble relaxing _3 Restless _4 Easily annoyed or irritable  _5 Afraid - awful might happen _6 Total GAD 7 Score _7 Anxiety Difficulty Very difficult Not difficult at all Very difficult Extremely difficult       09/30/2021   10:28 AM 02/19/2021    9:04 AM 11/26/2020   11:46 AM  Depression screen PHQ 2/9  Decreased Interest _8 Down, Depressed, Hopeless _9 PHQ - 2 Score _10 Altered sleeping _11 Tired, decreased energy _12 Change in appetite 1 0 1  Feeling bad or failure about yourself  _13 Trouble concentrating _14 Moving slowly or fidgety/restless _15 Suicidal thoughts 0 0 1  PHQ-9 Score _16 Difficult doing work/chores Somewhat difficult Very difficult Very difficult    BP Readings from Last  3 Encounters:  09/30/21 120/80  05/31/21 122/80  05/12/21 110/90    Physical Exam Vitals and nursing note reviewed.  HENT:     Head: Normocephalic.     Right Ear: Tympanic membrane and external ear normal.     Left Ear: Tympanic membrane and external ear normal.     Nose: Nose normal.     Mouth/Throat:     Mouth: Mucous membranes are moist.     Pharynx: No oropharyngeal exudate or posterior oropharyngeal erythema.  Eyes:     General: No scleral icterus.       Right eye: No discharge.        Left eye: No discharge.     Conjunctiva/sclera: Conjunctivae normal.     Pupils: Pupils are equal, round, and reactive to light.  Neck:     Thyroid: No thyromegaly.     Vascular: No JVD.     Trachea: No tracheal deviation.  Cardiovascular:     Rate and Rhythm: Normal rate and regular rhythm.     Heart sounds: Normal heart sounds. No murmur heard.    No friction rub. No gallop.  Pulmonary:     Effort: No respiratory distress.     Breath sounds: Normal breath sounds. No wheezing, rhonchi or rales.  Abdominal:     General: Bowel sounds are normal.     Palpations: Abdomen is soft. There is no mass.     Tenderness: There is no abdominal tenderness. There is no right CVA tenderness, left CVA  tenderness, guarding or rebound.  Musculoskeletal:        General: No tenderness. Normal range of motion.     Cervical back: Normal range of motion and neck supple.  Lymphadenopathy:     Cervical: No cervical adenopathy.  Skin:    General: Skin is warm.     Findings: No rash.  Neurological:     Mental Status: He is alert and oriented to person, place, and time.     Cranial Nerves: No cranial nerve deficit.     Deep Tendon Reflexes: Reflexes are normal and symmetric.     Wt Readings from Last 3 Encounters:  09/30/21 250 lb (113.4 kg)  05/31/21 256 lb (116.1 kg)  05/12/21 255 lb (115.7 kg)    BP 120/80   Pulse 76   Ht _0  (1.88 m)   Wt 250 lb (113.4 kg)   BMI 32.10 kg/m   Assessment and Plan:  1. Prediabetes Chronic.  Controlled.  Stable.  Patient had an blood glucose of 120 but this was nonfasting.  Asymptomatic.  Diet controlled.  Patient is also being given new information for diabetes and nutrition and has been recounseled about starches and concentrated sweets.  We will recheck A1c as well as lipid panel and microalbuminuria. - HgB A1c - Lipid Panel With LDL/HDL Ratio - Microalbumin, urine

## 2021-09-30 NOTE — Patient Instructions (Signed)

## 2021-10-01 LAB — LIPID PANEL WITH LDL/HDL RATIO
Cholesterol, Total: 209 mg/dL — ABNORMAL HIGH (ref 100–199)
HDL: 32 mg/dL — ABNORMAL LOW (ref >39)
LDL Chol Calc (NIH): 127 mg/dL — ABNORMAL HIGH (ref 0–99)
LDL/HDL Ratio: 4 ratio — ABNORMAL HIGH (ref 0.0–3.6)
Triglycerides: 281 mg/dL — ABNORMAL HIGH (ref 0–149)
VLDL Cholesterol Cal: 50 mg/dL — ABNORMAL HIGH (ref 5–40)

## 2021-10-01 LAB — HEMOGLOBIN A1C
Est. average glucose Bld gHb Est-mCnc: 128 mg/dL
Hgb A1c MFr Bld: 6.1 % — ABNORMAL HIGH (ref 4.8–5.6)

## 2021-10-01 LAB — MICROALBUMIN, URINE: Microalbumin, Urine: 7.3 ug/mL

## 2021-10-26 DIAGNOSIS — H5340 Unspecified visual field defects: Secondary | ICD-10-CM | POA: Diagnosis not present

## 2021-11-04 ENCOUNTER — Ambulatory Visit: Payer: Medicaid Other | Admitting: Pulmonary Disease

## 2021-11-04 ENCOUNTER — Encounter: Payer: Self-pay | Admitting: Pulmonary Disease

## 2021-11-04 VITALS — BP 128/82 | HR 65 | Temp 97.8°F | Ht 74.0 in | Wt 247.4 lb

## 2021-11-04 DIAGNOSIS — F17210 Nicotine dependence, cigarettes, uncomplicated: Secondary | ICD-10-CM | POA: Diagnosis not present

## 2021-11-04 DIAGNOSIS — R0602 Shortness of breath: Secondary | ICD-10-CM | POA: Diagnosis not present

## 2021-11-04 DIAGNOSIS — G4733 Obstructive sleep apnea (adult) (pediatric): Secondary | ICD-10-CM | POA: Diagnosis not present

## 2021-11-04 DIAGNOSIS — D869 Sarcoidosis, unspecified: Secondary | ICD-10-CM

## 2021-11-04 NOTE — Progress Notes (Signed)
Subjective:    Patient ID: Luis Mcknight, male    DOB: [birth year], 41 y.o.   MRN: [id number] Patient Care Team: Juline Patch, MD as PCP - General (Family Medicine) End, Harrell Gave, MD as PCP - Cardiology (Cardiology) Tyler Pita, MD as Consulting Physician (Pulmonary Disease)  Chief Complaint  Patient presents with   Follow-up    SOB with incline and occ wheezing.    HPI Patient is a 41 year old current smoker (half PPD, 10 PY) who presents for follow-up on the issue of sarcoidosis and obstructive sleep apnea.  I have not seen the patient since 18 December 2019.  He had been lost to follow-up.  Since that time he has been compliant with his CPAP at 11 cm H2O.  He had Some appointments with sleep medicine.  Last appointment with sleep medicine was on 19 November 2019.  He states that since he had a mask change he has been able to wear his CPAP without difficulty.  His download shows that he has been compliant with the therapy.  Average daily usage is 8 hours 43 minutes.  His AHI is well-controlled on his current CPAP.  He has a AutoSet ranging from 8 to 12 cm H2O.  His average is around 9 cm H2O.  He has no nocturnal awakenings.  Feels refreshed in the mornings.  He reports benefit from the therapy.  With regards to his sarcoidosis he has not had any fevers, chills or sweats.  No joint pains or myalgias.  He has some baseline shortness of breath with heavy exertion.  He unfortunately also continues to smoke as noted above.  He has not had any cough or sputum production.  No hemoptysis.  No chest pain.  No orthopnea or paroxysmal nocturnal dyspnea.  He voices no other complaints.  Overall he feels well and looks well.  DATA: 03/27/2018 CT angio scan of the chest no PE, nodular areas of consolidation in the left upper lobe, mediastinal and hilar adenopathy 09/27/2018 CT scan of the chest progressive nodularity throughout the left upper lobe with associated mediastinal and bilateral  hilar lymphadenopathy 09/27/2018 QuantiFERON gold negative 10/08/2018 bronchoscopy with navigational assistance and endobronchial ultrasound findings consistent with sarcoidosis, Burkholderia cepacia infection (treated) 11/14/2018 pulmonary function testing relatively normal FEV1 3.85 L or 94% predicted.  FEV1/FVC 84%, DLCO 61% 12/11/2018 echocardiogram essentially normal 01/01/2019 ACE level normal at 34 U/L 01/14/2019 repeat CT scan chest, improvement on previously read parenchymal nodules and coalescent infiltrates as well as improvement in mediastinal adenopathy 02/08/2019 sleep study AHI 34.3, SPO2 low 88% 08/25/2019 auto CPAP titration to 11 cm H2O 04/29/2019 cardiac MRI, normal with no evidence of sarcoid 11/19/2019 CXR clearing of the previously noted left upper lobe infiltration the only mild scarring left, no adenopathy noted 01/03/2020 chest x-ray shows no acute process    Review of Systems A 10 point review of systems was performed and it is as noted above otherwise negative.  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   Lumbar spondylosis 06/01/2020   Plantar fasciitis, 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   Chest pain of uncertain etiology 62/95/2841   OSA (obstructive sleep apnea) 06/04/2019   Current smoker 06/04/2019  Healthcare maintenance 06/04/2019   Acute pericarditis 04/03/2019   Sarcoidosis 10/16/2018   Lung nodules    Adenopathy    Essential hypertension 11/28/2017   Social History   Tobacco Use   Smoking status: Every Day    Packs/day: 0.50    Years: 20.00    Total pack years: 10.00    Types:  Cigarettes    Start date: 03/07/1998   Smokeless tobacco: Never   Tobacco comments:    0.5PPD 11/04/2021  Substance Use Topics   Alcohol use: Not Currently   Allergies  Allergen Reactions   Latex Itching    GLOVES   Current Meds  Medication Sig   amitriptyline (ELAVIL) 50 MG tablet Take 1 tablet by mouth at bedtime.   amLODipine (NORVASC) 10 MG tablet Take 1 tablet (10 mg total) by mouth every morning.   bisoprolol (ZEBETA) 5 MG tablet Take 1 tablet by mouth once daily   FLUoxetine (PROZAC) 40 MG capsule Take 40 mg by mouth every morning. psych   hydrocortisone 2.5 % cream Apply topically See admin instructions. Can mix with ketoconazole cream and use twice daily as needed for itchy flared areas. D/c when no longer flared.   pantoprazole (PROTONIX) 20 MG tablet TAKE 1 TABLET(20 MG) BY MOUTH DAILY   pregabalin (LYRICA) 100 MG capsule Take 1 capsule (100 mg total) by mouth 3 (three) times daily. (Patient taking differently: Take 100 mg by mouth 3 (three) times daily. Melrose Nakayama)   triamcinolone cream (KENALOG) 0.1 % Apply to aa of arms and legs bid for itchy rash. Avoid applying to face, groin, and axilla. Use as directed.   triamterene-hydrochlorothiazide (MAXZIDE) 75-50 MG tablet Take 1 tablet by mouth daily.   UNABLE TO FIND CPAP AT NIGHT    There is no immunization history on file for this patient.     Objective:   Physical Exam BP 128/82 (BP Location: Left Arm, Cuff Size: Large)   Pulse 65   Temp 97.8 F (36.6 C) (Temporal)   Ht '6\' 2"'$  (1.88 m)   Wt 247 lb 6.4 oz (112.2 kg)   SpO2 99%   BMI 31.76 kg/m  GENERAL: Well-developed, well-nourished gentleman, no acute distress, fully ambulatory.  No conversational dyspnea. HEAD: Normocephalic, atraumatic.  EYES: Pupils equal, round, reactive to light.  No scleral icterus.  MOUTH: Dentition intact, oral mucosa moist.  Mallampati IV. NECK: Supple. No thyromegaly. Trachea midline. No JVD.  No adenopathy. PULMONARY: Good air entry  bilaterally.  No adventitious sounds. CARDIOVASCULAR: S1 and S2.  Sinus arrhythmia.  No rubs, murmurs or gallops heard. ABDOMEN: Benign. MUSCULOSKELETAL: No joint deformity, no clubbing, no edema.  NEUROLOGIC: No focal deficit noted, speech is fluent, no gait disturbance. SKIN: Intact,warm,dry. PSYCH: Mood and behavior are normal.    Assessment & Plan:     ICD-10-CM   1. Shortness of breath  R06.02 Pulmonary Function Test [facility] Only   Will reassess with PFTs Smoking cessation encouraged    2. Sarcoidosis  D86.9 Pulmonary Function Test [facility] Only   In remission    3. OSA (obstructive sleep apnea)  G47.33    Very compliant with CPAP Notes benefit from therapy    4. Tobacco dependence due to cigarettes  F17.210    Patient counseled regards discontinuation of smoking Total counseling time 3 to 5 minutes     Orders Placed This Encounter  Procedures   Pulmonary Function Test [facility] Only    Standing Status:   Future    Standing Expiration  Date:   11/05/2022    Scheduling Instructions:     Oct 2023    Order Specific Question:   Full PFT: includes the following: basic spirometry, spirometry pre & post bronchodilator, diffusion capacity (DLCO), lung volumes    Answer:   Full PFT   From the sarcoidosis standpoint at least clinically, patient appears to be in remission.  Will obtain PFTs.  He has been experiencing some shortness of breath with exertion but this is not new.  However, I need to make sure that his pulmonary function is stable and not declining.  The patient has been counseled with regards to discontinuation of smoking.  We will see the patient in follow-up in 4 months time, he is to call sooner should any new respiratory problems arise.  Renold Don, MD [facility]    *This note was dictated using voice recognition software/Dragon.  Despite best efforts to proofread, errors can occur which can change the meaning. Any  transcriptional errors that result from this process are unintentional and may not be fully corrected at the time of dictation.

## 2021-11-04 NOTE — Patient Instructions (Addendum)
We are going to get some breathing test to see where your breathing is at.  Your compliance with the CPAP is excellent.  Continue using your CPAP.  Please stop smoking!  We will see you in follow-up in 4 months time call sooner should any new problems arise.

## 2021-11-22 ENCOUNTER — Other Ambulatory Visit: Payer: Self-pay | Admitting: Family Medicine

## 2021-11-22 DIAGNOSIS — I10 Essential (primary) hypertension: Secondary | ICD-10-CM

## 2021-12-06 DIAGNOSIS — R519 Headache, unspecified: Secondary | ICD-10-CM | POA: Diagnosis not present

## 2021-12-06 DIAGNOSIS — G629 Polyneuropathy, unspecified: Secondary | ICD-10-CM | POA: Diagnosis not present

## 2021-12-06 DIAGNOSIS — E559 Vitamin D deficiency, unspecified: Secondary | ICD-10-CM | POA: Diagnosis not present

## 2021-12-06 DIAGNOSIS — R200 Anesthesia of skin: Secondary | ICD-10-CM | POA: Diagnosis not present

## 2021-12-06 DIAGNOSIS — M79671 Pain in right foot: Secondary | ICD-10-CM | POA: Diagnosis not present

## 2021-12-06 DIAGNOSIS — M542 Cervicalgia: Secondary | ICD-10-CM | POA: Diagnosis not present

## 2021-12-06 DIAGNOSIS — E538 Deficiency of other specified B group vitamins: Secondary | ICD-10-CM | POA: Diagnosis not present

## 2021-12-06 DIAGNOSIS — M545 Low back pain, unspecified: Secondary | ICD-10-CM | POA: Diagnosis not present

## 2021-12-06 DIAGNOSIS — M722 Plantar fascial fibromatosis: Secondary | ICD-10-CM | POA: Diagnosis not present

## 2021-12-06 DIAGNOSIS — R202 Paresthesia of skin: Secondary | ICD-10-CM | POA: Diagnosis not present

## 2021-12-06 DIAGNOSIS — R5383 Other fatigue: Secondary | ICD-10-CM | POA: Diagnosis not present

## 2021-12-10 ENCOUNTER — Other Ambulatory Visit: Payer: Self-pay | Admitting: Family Medicine

## 2021-12-10 DIAGNOSIS — I10 Essential (primary) hypertension: Secondary | ICD-10-CM

## 2021-12-10 MED ORDER — AMLODIPINE BESYLATE 10 MG PO TABS: ORAL_TABLET | ORAL | 1 refills | Status: DC

## 2021-12-10 MED ORDER — TRIAMTERENE-HCTZ 75-50 MG PO TABS: 1.0000 | ORAL_TABLET | Freq: Every day | ORAL | 1 refills | Status: DC

## 2021-12-10 NOTE — Telephone Encounter (Signed)
Medication Refill - Medication: amLODipine (NORVASC) 10 MG tablet [[id number]]   triamterene-hydrochlorothiazide (MAXZIDE) 75-50 MG tablet [[id number]]    Has the patient contacted their pharmacy? Yes.   (Agent: If no, request that the patient contact the pharmacy for the refill. If patient does not wish to contact the pharmacy document the reason why and proceed with request.) (Agent: If yes, when and what did the pharmacy advise?)  Preferred Pharmacy (with phone number or street name):  [facility], [place] [address]  [place] [address]  Phone: [phone] Fax: [phone]  Hours: Not open 24 hours   Has the patient been seen for an appointment in the last year OR does the patient have an upcoming appointment? Yes.    Agent: Please be advised that RX refills may take up to 3 business days. We ask that you follow-up with your pharmacy.

## 2021-12-10 NOTE — Telephone Encounter (Signed)
Requested Prescriptions  Pending Prescriptions Disp Refills  . triamterene-hydrochlorothiazide (MAXZIDE) 75-50 MG tablet 90 tablet 1    Sig: Take 1 tablet by mouth daily.     Cardiovascular: Diuretic Combos Failed - 12/10/2021  1:14 PM      Failed - K in normal range and within 180 days    Potassium  Date Value Ref Range Status  05/31/2021 4.0 3.5 - 5.2 mmol/L Final         Failed - Na in normal range and within 180 days    Sodium  Date Value Ref Range Status  05/31/2021 138 134 - 144 mmol/L Final         Failed - Cr in normal range and within 180 days    Creatinine, Ser  Date Value Ref Range Status  05/31/2021 1.13 0.76 - 1.27 mg/dL Final         Passed - Last BP in normal range    BP Readings from Last 1 Encounters:  11/04/21 128/82         Passed - Valid encounter within last 6 months    Recent Outpatient Visits          2 months ago Prediabetes   [facility] [place], Deanna C, MD   6 months ago Prediabetes   [facility], Deanna C, MD   9 months ago Essential hypertension   [place] [facility] [place], Deanna C, MD   1 year ago Plantar fasciitis, right   [facility] [place], Earley Abide, MD   1 year ago [place] [facility] [place], Earley Abide, MD      Future Appointments            In 1 month Juline Patch, MD [facility] at [facility]           . amLODipine (NORVASC) 10 MG tablet 90 tablet 1    Sig: TAKE 1 TABLET(10 MG) BY MOUTH EVERY MORNING     Cardiovascular: Calcium Channel Blockers 2 Passed - 12/10/2021  1:14 PM      Passed - Last BP in normal range    BP Readings from Last 1 Encounters:  11/04/21 128/82         Passed - Last Heart Rate in normal  range    Pulse Readings from Last 1 Encounters:  11/04/21 65         Passed - Valid encounter within last 6 months    Recent Outpatient Visits          2 months ago Prediabetes   [facility] [place], Deanna C, MD   6 months ago Prediabetes   [place] [facility] [place], Deanna C, MD   9 months ago Essential hypertension   [place] [facility] [place], Deanna C, MD   1 year ago Plantar fasciitis, right   [place] [facility] [place], Earley Abide, MD   1 year ago [place] [facility], Earley Abide, MD      Future Appointments  In 1 month Juline Patch, MD [facility] at [facility], [facility]

## 2021-12-13 DIAGNOSIS — F900 Attention-deficit hyperactivity disorder, predominantly inattentive type: Secondary | ICD-10-CM | POA: Diagnosis not present

## 2021-12-13 DIAGNOSIS — F331 Major depressive disorder, recurrent, moderate: Secondary | ICD-10-CM | POA: Diagnosis not present

## 2021-12-29 DIAGNOSIS — G8929 Other chronic pain: Secondary | ICD-10-CM | POA: Diagnosis not present

## 2021-12-29 DIAGNOSIS — Z6831 Body mass index (BMI) 31.0-31.9, adult: Secondary | ICD-10-CM | POA: Diagnosis not present

## 2021-12-29 DIAGNOSIS — M5137 Other intervertebral disc degeneration, lumbosacral region: Secondary | ICD-10-CM | POA: Diagnosis not present

## 2021-12-29 DIAGNOSIS — M5416 Radiculopathy, lumbar region: Secondary | ICD-10-CM | POA: Diagnosis not present

## 2021-12-29 DIAGNOSIS — M5441 Lumbago with sciatica, right side: Secondary | ICD-10-CM | POA: Diagnosis not present

## 2021-12-29 DIAGNOSIS — M5442 Lumbago with sciatica, left side: Secondary | ICD-10-CM | POA: Diagnosis not present

## 2022-01-13 ENCOUNTER — Other Ambulatory Visit: Payer: Self-pay | Admitting: Physician Assistant

## 2022-01-13 DIAGNOSIS — M542 Cervicalgia: Secondary | ICD-10-CM

## 2022-01-13 DIAGNOSIS — R202 Paresthesia of skin: Secondary | ICD-10-CM

## 2022-01-17 DIAGNOSIS — M6281 Muscle weakness (generalized): Secondary | ICD-10-CM | POA: Diagnosis not present

## 2022-01-17 DIAGNOSIS — M5440 Lumbago with sciatica, unspecified side: Secondary | ICD-10-CM | POA: Diagnosis not present

## 2022-01-17 DIAGNOSIS — G8929 Other chronic pain: Secondary | ICD-10-CM | POA: Diagnosis not present

## 2022-01-20 DIAGNOSIS — M545 Low back pain, unspecified: Secondary | ICD-10-CM | POA: Diagnosis not present

## 2022-01-20 DIAGNOSIS — G8929 Other chronic pain: Secondary | ICD-10-CM | POA: Diagnosis not present

## 2022-01-23 ENCOUNTER — Ambulatory Visit
Admission: RE | Admit: 2022-01-23 | Discharge: 2022-01-23 | Disposition: A | Discharge status: Home / Self Care | Payer: Medicaid Other | Source: Ambulatory Visit | Attending: Physician Assistant | Admitting: Physician Assistant

## 2022-01-23 DIAGNOSIS — R200 Anesthesia of skin: Secondary | ICD-10-CM

## 2022-01-23 DIAGNOSIS — R202 Paresthesia of skin: Secondary | ICD-10-CM | POA: Diagnosis not present

## 2022-01-23 DIAGNOSIS — M542 Cervicalgia: Secondary | ICD-10-CM

## 2022-01-24 ENCOUNTER — Other Ambulatory Visit: Payer: Medicaid Other

## 2022-01-28 ENCOUNTER — Other Ambulatory Visit: Payer: Self-pay | Admitting: Internal Medicine

## 2022-01-31 ENCOUNTER — Ambulatory Visit: Payer: Medicaid Other | Admitting: Family Medicine

## 2022-02-01 DIAGNOSIS — G8929 Other chronic pain: Secondary | ICD-10-CM | POA: Diagnosis not present

## 2022-02-01 DIAGNOSIS — M5440 Lumbago with sciatica, unspecified side: Secondary | ICD-10-CM | POA: Diagnosis not present

## 2022-02-01 DIAGNOSIS — M6281 Muscle weakness (generalized): Secondary | ICD-10-CM | POA: Diagnosis not present

## 2022-02-02 ENCOUNTER — Telehealth: Payer: Self-pay | Admitting: *Deleted

## 2022-02-02 NOTE — Telephone Encounter (Signed)
PA required for Bisoprolol 5 mg tablet. PA has been submitted via covermymeds. Awaiting approval.

## 2022-02-04 ENCOUNTER — Encounter: Payer: Self-pay | Admitting: Internal Medicine

## 2022-02-04 ENCOUNTER — Encounter: Payer: Self-pay | Admitting: Family Medicine

## 2022-02-07 DIAGNOSIS — M5440 Lumbago with sciatica, unspecified side: Secondary | ICD-10-CM | POA: Diagnosis not present

## 2022-02-07 DIAGNOSIS — F331 Major depressive disorder, recurrent, moderate: Secondary | ICD-10-CM | POA: Diagnosis not present

## 2022-02-07 DIAGNOSIS — G8929 Other chronic pain: Secondary | ICD-10-CM | POA: Diagnosis not present

## 2022-02-07 DIAGNOSIS — F900 Attention-deficit hyperactivity disorder, predominantly inattentive type: Secondary | ICD-10-CM | POA: Diagnosis not present

## 2022-02-07 DIAGNOSIS — M6281 Muscle weakness (generalized): Secondary | ICD-10-CM | POA: Diagnosis not present

## 2022-02-09 ENCOUNTER — Telehealth: Payer: Self-pay | Admitting: Internal Medicine

## 2022-02-09 ENCOUNTER — Telehealth: Payer: Self-pay | Admitting: Family Medicine

## 2022-02-09 MED ORDER — METOPROLOL SUCCINATE ER 25 MG PO TB24: 25.0000 mg | ORAL_TABLET | Freq: Every day | ORAL | 3 refills | Status: DC

## 2022-02-09 NOTE — Telephone Encounter (Signed)
Please see previous encounter

## 2022-02-09 NOTE — Telephone Encounter (Signed)
Pt is returning [place], RN call in regards to [employer]. Transferred to [employer].

## 2022-02-09 NOTE — Telephone Encounter (Signed)
Pt updated with MD's recommendations and verbalized understanding. Bisoprolol d/c Metoprolol Succinate 25 mg daily sent to requested pharmacy.

## 2022-02-09 NOTE — Telephone Encounter (Signed)
Left message to call back  

## 2022-02-09 NOTE — Telephone Encounter (Signed)
The patient states he is pre diabetic and nervous about his sugar numbers. He called in stating his previous machine will not work anymore and that his insurance will cover the One Touch glucose monitor. He has an appt in the morning with his provider but was hoping he could possibly get this as soon as possible. Please assist patient further.

## 2022-02-09 NOTE — Telephone Encounter (Signed)
Please let [name] know that I have reviewed his chart as well as recent letter from his insurance company denying bisoprolol.  I do not see that he has been on any other beta-blockers in the past.  I therefore recommend that we change bisoprolol to metoprolol succinate 25 mg daily.  In regard to his request for a letter stating work limitations, there were no cardiac indications for remaining out of work at our last visit.  I suspect that the shortness of breath that he mentioned at his last visit is driven primarily by his pulmonary sarcoidosis and would be better described by his pulmonologist.  If his symptoms have changed since our last visit in March, I suggest that he schedule an appointment with [place] at his earliest convenience so that we can evaluate these further and provide additional guidance regarding his work.  Nelva Bush, MD [facility]

## 2022-02-10 ENCOUNTER — Ambulatory Visit: Payer: Medicaid Other | Admitting: Family Medicine

## 2022-02-10 DIAGNOSIS — I10 Essential (primary) hypertension: Secondary | ICD-10-CM

## 2022-02-10 DIAGNOSIS — K219 Gastro-esophageal reflux disease without esophagitis: Secondary | ICD-10-CM

## 2022-02-15 ENCOUNTER — Ambulatory Visit (INDEPENDENT_AMBULATORY_CARE_PROVIDER_SITE_OTHER): Payer: Medicaid Other | Admitting: Family Medicine

## 2022-02-15 ENCOUNTER — Encounter: Payer: Self-pay | Admitting: Family Medicine

## 2022-02-15 VITALS — BP 118/78 | HR 70 | Ht 74.0 in | Wt 251.0 lb

## 2022-02-15 DIAGNOSIS — R7303 Prediabetes: Secondary | ICD-10-CM | POA: Diagnosis not present

## 2022-02-15 DIAGNOSIS — E785 Hyperlipidemia, unspecified: Secondary | ICD-10-CM

## 2022-02-15 DIAGNOSIS — F17200 Nicotine dependence, unspecified, uncomplicated: Secondary | ICD-10-CM

## 2022-02-15 DIAGNOSIS — I10 Essential (primary) hypertension: Secondary | ICD-10-CM

## 2022-02-15 DIAGNOSIS — K219 Gastro-esophageal reflux disease without esophagitis: Secondary | ICD-10-CM

## 2022-02-15 MED ORDER — TRIAMTERENE-HCTZ 75-50 MG PO TABS: 1.0000 | ORAL_TABLET | Freq: Every day | ORAL | 1 refills | Status: DC

## 2022-02-15 MED ORDER — AMLODIPINE BESYLATE 10 MG PO TABS: ORAL_TABLET | ORAL | 1 refills | Status: DC

## 2022-02-15 MED ORDER — PANTOPRAZOLE SODIUM 20 MG PO TBEC: DELAYED_RELEASE_TABLET | ORAL | 0 refills | Status: DC

## 2022-02-15 NOTE — Patient Instructions (Signed)

## 2022-02-15 NOTE — Progress Notes (Signed)
Date:  02/15/2022   Name:  Luis Mcknight   DOB:  [birth year]   MRN:  [id number]   Chief Complaint: Gastroesophageal Reflux, Hypertension, and Prediabetes  Gastroesophageal Reflux He reports no abdominal pain, no chest pain, no choking, no dysphagia, no heartburn or no wheezing. This is a chronic problem. The current episode started more than 1 year ago. The problem has been waxing and waning. Pertinent negatives include no anemia, fatigue, melena, muscle weakness, orthopnea or weight loss. He has tried a PPI for the symptoms. The treatment provided moderate relief.  Hypertension This is a chronic problem. The current episode started more than 1 year ago. The problem has been waxing and waning since onset. The problem is controlled. Pertinent negatives include no anxiety, blurred vision, chest pain, headaches, malaise/fatigue, neck pain, orthopnea, palpitations, peripheral edema, PND, shortness of breath or sweats. There are no associated agents to hypertension. There are no known risk factors for coronary artery disease. Past treatments include diuretics and calcium channel blockers. The current treatment provides moderate improvement. There are no compliance problems.  There is no history of angina, kidney disease, CAD/MI, CVA, heart failure, left ventricular hypertrophy, PVD or retinopathy. There is no history of chronic renal disease, a hypertension causing med or renovascular disease.  Hyperlipidemia This is a chronic problem. The problem is controlled. Recent lipid tests were reviewed and are normal. He has no history of chronic renal disease. There are no known factors aggravating his hyperlipidemia. Pertinent negatives include no chest pain or shortness of breath. Current antihyperlipidemic treatment includes diet change. The current treatment provides moderate improvement of lipids.    Lab Results  Component Value Date   NA 138 05/31/2021   K 4.0 05/31/2021   CO2 26 05/31/2021    GLUCOSE 125 (H) 05/31/2021   BUN 12 05/31/2021   CREATININE 1.13 05/31/2021   CALCIUM 9.2 05/31/2021   EGFR 85 05/31/2021   GFRNONAA 99 04/03/2019   Lab Results  Component Value Date   CHOL 209 (H) 09/30/2021   HDL 32 (L) 09/30/2021   LDLCALC 127 (H) 09/30/2021   TRIG 281 (H) 09/30/2021   No results found for: "TSH" Lab Results  Component Value Date   HGBA1C 6.1 (H) 09/30/2021   Lab Results  Component Value Date   WBC 7.1 01/01/2019   HGB 14.7 01/01/2019   HCT 45.7 01/01/2019   MCV 80.3 01/01/2019   PLT 237 01/01/2019   Lab Results  Component Value Date   ALT 52 (H) 01/01/2019   AST 23 01/01/2019   ALKPHOS 101 01/01/2019   BILITOT 0.6 01/01/2019   No results found for: "25OHVITD2", "25OHVITD3", "VD25OH"   Review of Systems  Constitutional:  Negative for fatigue, malaise/fatigue and weight loss.  HENT:  Negative for trouble swallowing.   Eyes:  Negative for blurred vision.  Respiratory: Negative.  Negative for choking, shortness of breath and wheezing.        Chest wall pain/sharp/left lateral  Cardiovascular:  Negative for chest pain, palpitations, orthopnea and PND.  Gastrointestinal:  Negative for abdominal pain, anal bleeding, dysphagia, heartburn and melena.  Endocrine: Negative for polydipsia and polyuria.  Musculoskeletal:  Negative for muscle weakness and neck pain.  Neurological:  Negative for headaches.    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   Lumbar spondylosis 06/01/2020   Plantar fasciitis, 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   Chest pain of uncertain etiology 08/28/7626   OSA (obstructive  sleep apnea) 06/04/2019   Current smoker 06/04/2019   Healthcare maintenance 06/04/2019   Acute pericarditis 04/03/2019   Sarcoidosis 10/16/2018   Lung nodules    Adenopathy    Essential hypertension 11/28/2017    Allergies  Allergen Reactions   Latex Itching    GLOVES    Past Surgical History:  Procedure Laterality Date   ELECTROMAGNETIC NAVIGATION BROCHOSCOPY Left 10/08/2018   Procedure: ELECTROMAGNETIC NAVIGATION BRONCHOSCOPY, LEFT;  Surgeon: Tyler Pita, MD;  Location: [facility]S;  Service: Cardiopulmonary;  Laterality: Left;   ENDOBRONCHIAL ULTRASOUND Left 10/08/2018   Procedure: ENDOBRONCHIAL ULTRASOUND, LEFT;  Surgeon: Tyler Pita, MD;  Location: [facility];  Service: Cardiopulmonary;  Laterality: Left;   NO PAST SURGERIES      Social History   Tobacco Use   Smoking status: Every Day    Packs/day: 0.50    Years: 20.00    Total pack years: 10.00    Types: Cigarettes    Start date: 03/07/1998   Smokeless tobacco: Never   Tobacco comments:    0.5PPD 11/04/2021  Vaping Use   Vaping Use: Former   Start date: 04/18/2012   Quit date: 04/18/2016  Substance Use Topics   Alcohol use: Not Currently   Drug use: Not Currently     Medication list has been reviewed and updated.  Current Meds  Medication Sig   amitriptyline (ELAVIL) 50 MG tablet Take 1 tablet by mouth at bedtime.   amLODipine (NORVASC) 10 MG tablet TAKE 1 TABLET(10 MG) BY MOUTH EVERY MORNING   ARIPiprazole (ABILIFY) 2 MG tablet Take 2 mg by mouth daily. psych   chlorhexidine (PERIDEX) 0.12 % solution 15 mLs 2 (two) times daily.   FLUoxetine (PROZAC) 40 MG capsule Take 40 mg by mouth every morning. psych   hydrocortisone 2.5 % cream Apply topically See admin instructions. Can mix with ketoconazole cream and use twice daily as needed for itchy flared areas. D/c when no longer flared.   metoprolol succinate (TOPROL XL) 25 MG 24 hr tablet Take 1 tablet (25 mg total) by mouth daily.   pantoprazole  (PROTONIX) 20 MG tablet TAKE 1 TABLET(20 MG) BY MOUTH DAILY   pregabalin (LYRICA) 100 MG capsule Take 1 capsule (100 mg total) by mouth 3 (three) times daily. (Patient taking differently: Take 100 mg by mouth 3 (three) times daily. Melrose Nakayama)   triamcinolone cream (KENALOG) 0.1 % Apply to aa of arms and legs bid for itchy rash. Avoid applying to face, groin, and axilla. Use as directed.   triamterene-hydrochlorothiazide (MAXZIDE) 75-50 MG tablet Take 1 tablet by mouth daily.   UNABLE TO FIND CPAP AT NIGHT       02/15/2022    9:55 AM 09/30/2021   10:29 AM 02/19/2021    9:05 AM 11/26/2020   11:47 AM  GAD 7 : Generalized Anxiety Score  Nervous, Anxious, on Edge 0 _0 Control/stop worrying 0 _1 Worry too much - different things 0 _2 Trouble relaxing 0 _3 Restless 0 _4 Easily annoyed or irritable 0 _5 Afraid - awful might happen 0 _6 Total GAD 7 Score 0 19 14  18  Anxiety Difficulty Not difficult at all Very difficult Not difficult at all Very difficult       02/15/2022    9:55 AM 09/30/2021   10:28 AM 02/19/2021    9:04 AM  Depression screen PHQ 2/9  Decreased Interest 0 3 2  Down, Depressed, Hopeless 0 3 1  PHQ - 2 Score 0 6 3  Altered sleeping 0 1 2  Tired, decreased energy 0 2 1  Change in appetite 0 1 0  Feeling bad or failure about yourself  0 3 3  Trouble concentrating 0 3 1  Moving slowly or fidgety/restless 0 3 2  Suicidal thoughts 0 0 0  PHQ-9 Score 0 19 12  Difficult doing work/chores Not difficult at all Somewhat difficult Very difficult    BP Readings from Last 3 Encounters:  02/15/22 118/78  11/04/21 128/82  09/30/21 120/80    Physical Exam Vitals and nursing note reviewed.  HENT:     Head: Normocephalic.     Right Ear: Tympanic membrane and external ear normal.     Left Ear: Tympanic membrane and external ear normal.     Nose: Nose normal. No congestion or rhinorrhea.  Eyes:     General: No scleral icterus.       Right eye:  No discharge.        Left eye: No discharge.     Conjunctiva/sclera: Conjunctivae normal.     Pupils: Pupils are equal, round, and reactive to light.  Neck:     Thyroid: No thyromegaly.     Vascular: No JVD.     Trachea: No tracheal deviation.  Cardiovascular:     Rate and Rhythm: Normal rate and regular rhythm.     Heart sounds: Normal heart sounds. No murmur heard.    No friction rub. No gallop.  Pulmonary:     Effort: No respiratory distress.     Breath sounds: Normal breath sounds. No wheezing, rhonchi or rales.  Abdominal:     General: Bowel sounds are normal. There is no distension.     Palpations: Abdomen is soft. There is no hepatomegaly, splenomegaly or mass.     Tenderness: There is no abdominal tenderness. There is no right CVA tenderness, left CVA tenderness, guarding or rebound.     Hernia: No hernia is present.  Musculoskeletal:        General: No tenderness. Normal range of motion.     Cervical back: Normal range of motion and neck supple.  Lymphadenopathy:     Cervical: No cervical adenopathy.  Skin:    General: Skin is warm.     Findings: No rash.  Neurological:     Mental Status: He is alert and oriented to person, place, and time.     Cranial Nerves: No cranial nerve deficit.     Deep Tendon Reflexes: Reflexes are normal and symmetric.     Wt Readings from Last 3 Encounters:  02/15/22 251 lb (113.9 kg)  11/04/21 247 lb 6.4 oz (112.2 kg)  09/30/21 250 lb (113.4 kg)    BP 118/78   Pulse 70   Ht _0  (1.88 m)   Wt 251 lb (113.9 kg)   SpO2 99%   BMI 32.23 kg/m   Assessment and Plan:  1. Essential hypertension Chronic.  Controlled.  Stable.  Blood pressure 118/78.  Continue amlodipine 10 mg once a day and Maxide 75-50 once a day.  Recheck CMP for electrolytes and GFR. - amLODipine (NORVASC) 10  MG tablet; TAKE 1 TABLET(10 MG) BY MOUTH EVERY MORNING  Dispense: 90 tablet; Refill: 1 - triamterene-hydrochlorothiazide (MAXZIDE) 75-50 MG tablet; Take 1  tablet by mouth daily.  Dispense: 90 tablet; Refill: 1 - Comprehensive Metabolic Panel (CMET)  2. Gastroesophageal reflux disease without esophagitis Chronic.  Controlled.  Stable.  Continue pantoprazole 20 mg once a day. - pantoprazole (PROTONIX) 20 MG tablet; TAKE 1 TABLET(20 MG) BY MOUTH DAILY  Dispense: 90 tablet; Refill: 0  3. Prediabetes .  Controlled.  Stable.  Continue diet controlled we will.  Will check A1c for current level of control. - HgB A1c - Comprehensive Metabolic Panel (CMET)  4. Hyperlipidemia, unspecified hyperlipidemia type Chronic.  Diet controlled.  Stable.  Continue with dietary approach and will check lipid panel for current level of control. - Lipid Panel With LDL/HDL Ratio  5. Current smoker Patient has been advised of the health risks of smoking and counseled concerning cessation of tobacco products. I spent over 3 minutes for discussion and to answer questions.     Otilio Miu, MD

## 2022-02-16 LAB — COMPREHENSIVE METABOLIC PANEL
ALT: 31 IU/L (ref 0–44)
AST: 25 IU/L (ref 0–40)
Albumin/Globulin Ratio: 1.6 (ref 1.2–2.2)
Albumin: 4.6 g/dL (ref 4.1–5.1)
Alkaline Phosphatase: 87 IU/L (ref 44–121)
BUN/Creatinine Ratio: 11 (ref 9–20)
BUN: 13 mg/dL (ref 6–20)
Bilirubin Total: 0.3 mg/dL (ref 0.0–1.2)
CO2: 26 mmol/L (ref 20–29)
Calcium: 9.8 mg/dL (ref 8.7–10.2)
Chloride: 100 mmol/L (ref 96–106)
Creatinine, Ser: 1.15 mg/dL (ref 0.76–1.27)
Globulin, Total: 2.8 g/dL (ref 1.5–4.5)
Glucose: 90 mg/dL (ref 70–99)
Potassium: 4.4 mmol/L (ref 3.5–5.2)
Sodium: 140 mmol/L (ref 134–144)
Total Protein: 7.4 g/dL (ref 6.0–8.5)
eGFR: 84 mL/min/{1.73_m2} (ref >59)

## 2022-02-16 LAB — LIPID PANEL WITH LDL/HDL RATIO
Cholesterol, Total: 200 mg/dL — ABNORMAL HIGH (ref 100–199)
HDL: 34 mg/dL — ABNORMAL LOW (ref >39)
LDL Chol Calc (NIH): 127 mg/dL — ABNORMAL HIGH (ref 0–99)
LDL/HDL Ratio: 3.7 ratio — ABNORMAL HIGH (ref 0.0–3.6)
Triglycerides: 220 mg/dL — ABNORMAL HIGH (ref 0–149)
VLDL Cholesterol Cal: 39 mg/dL (ref 5–40)

## 2022-02-16 LAB — HEMOGLOBIN A1C
Est. average glucose Bld gHb Est-mCnc: 134 mg/dL
Hgb A1c MFr Bld: 6.3 % — ABNORMAL HIGH (ref 4.8–5.6)

## 2022-02-17 ENCOUNTER — Other Ambulatory Visit: Payer: Self-pay

## 2022-02-17 ENCOUNTER — Ambulatory Visit: Payer: Medicaid Other | Attending: Pulmonary Disease

## 2022-02-17 DIAGNOSIS — D869 Sarcoidosis, unspecified: Secondary | ICD-10-CM | POA: Diagnosis not present

## 2022-02-17 DIAGNOSIS — R059 Cough, unspecified: Secondary | ICD-10-CM | POA: Diagnosis not present

## 2022-02-17 DIAGNOSIS — R0602 Shortness of breath: Secondary | ICD-10-CM

## 2022-02-17 DIAGNOSIS — F17210 Nicotine dependence, cigarettes, uncomplicated: Secondary | ICD-10-CM | POA: Diagnosis not present

## 2022-02-17 LAB — PULMONARY FUNCTION TEST ARMC ONLY
DL/VA % pred: 89 %
DL/VA: 4.14 ml/min/mmHg/L
DLCO unc % pred: 59 %
DLCO unc: 20.57 ml/min/mmHg
FEF 25-75 Post: 3.22 L/sec
FEF 25-75 Pre: 3.86 L/sec
FEF2575-%Change-Post: -16 %
FEF2575-%Pred-Post: 73 %
FEF2575-%Pred-Pre: 88 %
FEV1-%Change-Post: -5 %
FEV1-%Pred-Post: 74 %
FEV1-%Pred-Pre: 78 %
FEV1-Post: 3.49 L
FEV1-Pre: 3.68 L
FEV1FVC-%Change-Post: -6 %
FEV1FVC-%Pred-Pre: 104 %
FEV6-%Change-Post: 0 %
FEV6-%Pred-Post: 77 %
FEV6-%Pred-Pre: 76 %
FEV6-Post: 4.42 L
FEV6-Pre: 4.38 L
FEV6FVC-%Pred-Post: 102 %
FEV6FVC-%Pred-Pre: 102 %
FVC-%Change-Post: 1 %
FVC-%Pred-Post: 75 %
FVC-%Pred-Pre: 74 %
FVC-Post: 4.45 L
FVC-Pre: 4.38 L
Post FEV1/FVC ratio: 79 %
Post FEV6/FVC ratio: 100 %
Pre FEV1/FVC ratio: 84 %
Pre FEV6/FVC Ratio: 100 %
RV % pred: 54 %
RV: 1.07 L
TLC % pred: 72 %
TLC: 5.45 L

## 2022-02-17 MED ORDER — BLOOD GLUCOSE METER KIT: PACK | 0 refills | Status: AC

## 2022-02-17 MED ORDER — ALBUTEROL SULFATE (2.5 MG/3ML) 0.083% IN NEBU
2.5000 mg | INHALATION_SOLUTION | Freq: Once | RESPIRATORY_TRACT | Status: AC
  Administered 2022-02-17: 2.5 mg via RESPIRATORY_TRACT

## 2022-02-17 MED ORDER — ATORVASTATIN CALCIUM 10 MG PO TABS: 10.0000 mg | ORAL_TABLET | Freq: Every day | ORAL | 0 refills | Status: DC

## 2022-03-04 ENCOUNTER — Ambulatory Visit (INDEPENDENT_AMBULATORY_CARE_PROVIDER_SITE_OTHER): Payer: Medicaid Other | Admitting: Pulmonary Disease

## 2022-03-04 ENCOUNTER — Encounter: Payer: Self-pay | Admitting: Pulmonary Disease

## 2022-03-04 VITALS — BP 120/80 | HR 78 | Temp 97.5°F | Ht 74.0 in | Wt 251.8 lb

## 2022-03-04 DIAGNOSIS — G4733 Obstructive sleep apnea (adult) (pediatric): Secondary | ICD-10-CM

## 2022-03-04 DIAGNOSIS — D869 Sarcoidosis, unspecified: Secondary | ICD-10-CM | POA: Diagnosis not present

## 2022-03-04 DIAGNOSIS — F17210 Nicotine dependence, cigarettes, uncomplicated: Secondary | ICD-10-CM | POA: Diagnosis not present

## 2022-03-04 NOTE — Patient Instructions (Addendum)
Doing well with your CPAP, continue the same.  Please keep working on quitting smoking altogether.  This will be very beneficial to your lungs.  We will see you in follow-up and 6 months time call sooner should any problems arise.

## 2022-03-04 NOTE — Progress Notes (Signed)
Subjective:    Patient ID: Luis Mcknight, male    DOB: 1980-05-12, 41 y.o.   MRN: [id number] Patient Care Team: Duanne Limerick, MD as PCP - General (Family Medicine) End, Cristal Deer, MD as PCP - Cardiology (Cardiology) Salena Saner, MD as Consulting Physician (Pulmonary Disease)  Chief Complaint  Patient presents with   Follow-up    Pain in left side and back. Cough with brownish or yellow sputum. SOB with exertion. No wheezing. Uses Cpap machine every night. Needs new mask.     HPI Patient is a 41 year old current smoker (1/4 PPD, 10 PY) who presents for follow-up on the issue of sarcoidosis and obstructive sleep apnea.  Patient was last seen on 04 November 2021 at that time repeat PFTs were ordered.  PFTs showed a modest decrease on his prior FEV1.  This will need to be followed expectantly.  Since his prior visit he has remained compliant with his CPAP at 11 cm H2O. he will need a new CPAP mask.  His download shows that he has been compliant with the therapy.  Average daily usage is 8 hours 29 minutes.  His AHI is well-controlled at 0.4.  He has a AutoSet ranging from 8 to 12 cm H2O.  His average is around 9 cm H2O.  He has no nocturnal awakenings.  Feels refreshed in the mornings.  He reports benefit from the therapy.   With regards to his sarcoidosis he has not had any fevers, chills or sweats.  No joint pains or myalgias.  He has some baseline shortness of breath with heavy exertion.  He unfortunately also continues to smoke as noted above.  He has not had a morning cough that produces yellowish to brownish sputum however no cough for the rest of the day.  No hemoptysis.  No hemoptysis.  No chest pain.  Does have dyspnea on exertion but unchanged from his baseline.  No orthopnea or paroxysmal nocturnal dyspnea.  He has had some left flank and back pain that has been going on and is being evaluated by primary care.  No pleuritic component to this pain.  Appears to be musculoskeletal.   He voices no other complaints.  Overall he feels well and looks well.    DATA: 03/27/2018 CT angio scan of the chest no PE, nodular areas of consolidation in the left upper lobe, mediastinal and hilar adenopathy 09/27/2018 CT scan of the chest progressive nodularity throughout the left upper lobe with associated mediastinal and bilateral hilar lymphadenopathy 09/27/2018 QuantiFERON gold negative 10/08/2018 bronchoscopy with navigational assistance and endobronchial ultrasound findings consistent with sarcoidosis, Burkholderia cepacia infection (treated) 11/14/2018 pulmonary function testing relatively normal FEV1 3.85 L or 94% predicted.  FEV1/FVC 84%, DLCO 61% 12/11/2018 echocardiogram essentially normal 01/01/2019 ACE level normal at 34 U/L 01/14/2019 repeat CT scan chest, improvement on previously read parenchymal nodules and coalescent infiltrates as well as improvement in mediastinal adenopathy 02/08/2019 sleep study AHI 34.3, SPO2 low 88% 08/25/2019 auto CPAP titration to 11 cm H2O 04/29/2019 cardiac MRI, normal with no evidence of sarcoid 11/19/2019 CXR clearing of the previously noted left upper lobe infiltration the only mild scarring left, no adenopathy noted 01/03/2020 chest x-ray shows no acute process 02/17/2022 PFTs: FEV1 3.68 L or 78% predicted, VC 4.38 L or 74% predicted, FEV1/FVC 84%, no bronchodilator response, lung volumes mildly reduced, diffusion capacity mildly reduced and corrects to normal by alveolar volume, compared to study performed 14 November 2018 there has been a modest reduction in FEV1.  Consistent with mild restriction/interstitial.  Review of Systems A 10 point review of systems was performed and it is as noted above otherwise negative.  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   Lumbar spondylosis 06/01/2020   Plantar fasciitis, 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   Chest pain of uncertain etiology 09/04/2019   OSA (obstructive sleep apnea) 06/04/2019   Current smoker 06/04/2019   Healthcare maintenance 06/04/2019   Acute pericarditis 04/03/2019   Sarcoidosis 10/16/2018   Lung nodules    Adenopathy    Essential hypertension 11/28/2017   Social History   Tobacco Use   Smoking status: Every Day    Packs/day: 0.50    Years: 20.00    Total pack years: 10.00    Types: Cigarettes    Start date: 03/07/1998   Smokeless tobacco: Never   Tobacco comments:    0.5PPD 11/04/2021    0.25 PPD 03/04/2022  Substance Use Topics   Alcohol use: Not Currently   Allergies  Allergen Reactions   Latex Itching and Rash    GLOVES   [place] Nut Extract Skin Test Nausea Only   Current Meds  Medication Sig   amitriptyline (ELAVIL) 50 MG tablet Take 1 tablet by mouth at bedtime.   amLODipine (NORVASC) 10 MG tablet TAKE 1 TABLET(10 MG) BY MOUTH EVERY MORNING   ARIPiprazole (ABILIFY) 2 MG tablet Take 2 mg by mouth daily. psych   atorvastatin (LIPITOR) 10 MG tablet Take 1 tablet (10 mg total) by mouth daily.   blood glucose meter kit and supplies Dispense based on patient and insurance preference. Use up to four times daily as directed. (FOR ICD-10 E10.9, E11.9).   chlorhexidine (PERIDEX) 0.12 % solution 15 mLs 2 (two) times daily.   FLUoxetine (PROZAC) 40 MG capsule Take 40 mg by mouth every morning. psych   hydrocortisone 2.5 % cream Apply topically See admin instructions. Can mix with ketoconazole cream and use twice daily as needed for itchy flared areas. D/c when no longer flared.   meloxicam (MOBIC) 15 MG tablet Take 15 mg by mouth daily.   metoprolol succinate (TOPROL XL) 25 MG 24 hr tablet Take  1 tablet (25 mg total) by mouth daily.   Multiple Vitamin (MULTI-VITAMIN) tablet Take 1 tablet by mouth daily.   pantoprazole (PROTONIX) 20 MG tablet TAKE 1 TABLET(20 MG) BY MOUTH DAILY   pregabalin (LYRICA) 100 MG capsule Take 1 capsule (100 mg total) by mouth 3 (three) times daily. (Patient taking differently: Take 100 mg by mouth 3 (three) times daily. Barnie Del)   tiZANidine (ZANAFLEX) 4 MG tablet Take 1 tablet by mouth 2 (two) times daily.   triamcinolone cream (KENALOG) 0.1 % Apply to aa of arms and legs bid for itchy rash. Avoid applying to face, groin, and axilla. Use as directed.   triamterene-hydrochlorothiazide (MAXZIDE) 75-50 MG tablet Take 1 tablet by mouth daily.   UNABLE TO FIND CPAP AT NIGHT    There is no immunization history on file for this patient.     Objective:   Physical Exam BP 120/80 (BP Location: Left Arm, Cuff Size: Large)   Pulse 78   Temp (!) 97.5 F (36.4 C)   Ht  6\' 2"  (1.88 m)   Wt 251 lb 12.8 oz (114.2 kg)   SpO2 99%   BMI 32.33 kg/m   SpO2: 99 % O2 Device: None (Room air)  GENERAL: Well-developed, well-nourished gentleman, no acute distress, fully ambulatory.  No conversational dyspnea. HEAD: Normocephalic, atraumatic.  EYES: Pupils equal, round, reactive to light.  No scleral icterus.  MOUTH: Dentition intact, oral mucosa moist.  Mallampati IV. NECK: Supple. No thyromegaly. Trachea midline. No JVD.  No adenopathy. PULMONARY: Good air entry bilaterally.  No adventitious sounds. CARDIOVASCULAR: S1 and S2.  Sinus arrhythmia.  No rubs, murmurs or gallops heard. ABDOMEN: Benign. MUSCULOSKELETAL: No joint deformity, no clubbing, no edema.  NEUROLOGIC: No focal deficit noted, speech is fluent, no gait disturbance. SKIN: Intact,warm,dry. PSYCH: Mood and behavior are normal.     Assessment & Plan:     ICD-10-CM   1. Sarcoidosis  D86.9    Peers to be quiescent Continue monitoring    2. OSA on CPAP  G47.33 AMB REFERRAL FOR DME   Compliant with  therapy Continue CPAP New CPAP mask ordered    3. Tobacco dependence due to cigarettes  F17.210    Patient counseled regards to discontinuation of smoking Counseling time 3 to 5 minutes     Orders Placed This Encounter  Procedures   AMB REFERRAL FOR DME    Referral Priority:   Routine    Referral Type:   Durable Medical Equipment Purchase    Number of Visits Requested:   1   Smoking cessation instruction/counseling given:  counseled patient on the dangers of tobacco use, advised patient to stop smoking, and reviewed strategies to maximize success.  See the patient in follow-up in 6 months time he is to contact us prior to that time should any new problems arise.  Gailen Shelter, MD Advanced Bronchoscopy P[facility]    *This note was dictated using voice recognition software/Dragon.  Despite best efforts to proofread, errors can occur which can change the meaning. Any transcriptional errors that result from this process are unintentional and may not be fully corrected at the time of dictation.

## 2022-03-14 DIAGNOSIS — M542 Cervicalgia: Secondary | ICD-10-CM | POA: Diagnosis not present

## 2022-03-14 DIAGNOSIS — G8929 Other chronic pain: Secondary | ICD-10-CM | POA: Diagnosis not present

## 2022-03-14 DIAGNOSIS — M5441 Lumbago with sciatica, right side: Secondary | ICD-10-CM | POA: Diagnosis not present

## 2022-03-14 DIAGNOSIS — M9931 Osseous stenosis of neural canal of cervical region: Secondary | ICD-10-CM | POA: Diagnosis not present

## 2022-03-14 DIAGNOSIS — M5412 Radiculopathy, cervical region: Secondary | ICD-10-CM | POA: Diagnosis not present

## 2022-03-14 DIAGNOSIS — M5442 Lumbago with sciatica, left side: Secondary | ICD-10-CM | POA: Diagnosis not present

## 2022-03-28 DIAGNOSIS — G4733 Obstructive sleep apnea (adult) (pediatric): Secondary | ICD-10-CM | POA: Diagnosis not present

## 2022-05-15 ENCOUNTER — Other Ambulatory Visit: Payer: Self-pay | Admitting: Family Medicine

## 2022-05-16 NOTE — Telephone Encounter (Signed)
Requested Prescriptions  Pending Prescriptions Disp Refills   atorvastatin (LIPITOR) 10 MG tablet [Pharmacy Med Name: ATORVASTATIN '10MG'$  TABLETS] 90 tablet 2    Sig: TAKE 1 TABLET(10 MG) BY MOUTH DAILY     Cardiovascular:  Antilipid - Statins Failed - 05/15/2022 10:53 AM      Failed - Lipid Panel in normal range within the last 12 months    Cholesterol, Total  Date Value Ref Range Status  02/15/2022 200 (H) 100 - 199 mg/dL Final   LDL Chol Calc (NIH)  Date Value Ref Range Status  02/15/2022 127 (H) 0 - 99 mg/dL Final   HDL  Date Value Ref Range Status  02/15/2022 34 (L) >39 mg/dL Final   Triglycerides  Date Value Ref Range Status  02/15/2022 220 (H) 0 - 149 mg/dL Final         Passed - Patient is not pregnant      Passed - Valid encounter within last 12 months    Recent Outpatient Visits           3 months ago Essential hypertension   [facility] [place], [place], MD   7 months ago Prediabetes   [facility] [place], Deanna C, MD   11 months ago Prediabetes   [facility] [place], Deanna C, MD   1 year ago Essential hypertension   [facility] [place], Deanna C, MD   1 year ago Plantar fasciitis, right   [facility] [place], Earley Abide, MD       Future Appointments             In 4 days Juline Patch, MD [place] [facility] [place]   In 3 months Juline Patch, MD [place] [facility], [facility]

## 2022-05-20 ENCOUNTER — Ambulatory Visit: Payer: Medicaid Other | Admitting: Family Medicine

## 2022-05-26 DIAGNOSIS — M545 Low back pain, unspecified: Secondary | ICD-10-CM | POA: Diagnosis not present

## 2022-05-26 DIAGNOSIS — G8929 Other chronic pain: Secondary | ICD-10-CM | POA: Diagnosis not present

## 2022-05-30 ENCOUNTER — Encounter: Payer: Self-pay | Admitting: Family Medicine

## 2022-05-30 ENCOUNTER — Ambulatory Visit (INDEPENDENT_AMBULATORY_CARE_PROVIDER_SITE_OTHER): Payer: Medicaid Other | Admitting: Family Medicine

## 2022-05-30 VITALS — BP 128/70 | HR 90 | Temp 99.0°F | Ht 74.0 in | Wt 255.0 lb

## 2022-05-30 DIAGNOSIS — R051 Acute cough: Secondary | ICD-10-CM | POA: Diagnosis not present

## 2022-05-30 DIAGNOSIS — R7303 Prediabetes: Secondary | ICD-10-CM | POA: Diagnosis not present

## 2022-05-30 DIAGNOSIS — I10 Essential (primary) hypertension: Secondary | ICD-10-CM

## 2022-05-30 DIAGNOSIS — J0100 Acute maxillary sinusitis, unspecified: Secondary | ICD-10-CM | POA: Diagnosis not present

## 2022-05-30 DIAGNOSIS — E785 Hyperlipidemia, unspecified: Secondary | ICD-10-CM | POA: Diagnosis not present

## 2022-05-30 LAB — POC COVID19 BINAXNOW: SARS Coronavirus 2 Ag: NEGATIVE

## 2022-05-30 MED ORDER — AMOXICILLIN 500 MG PO CAPS: 500.0000 mg | ORAL_CAPSULE | Freq: Three times a day (TID) | ORAL | 0 refills | Status: AC

## 2022-05-30 MED ORDER — ATORVASTATIN CALCIUM 10 MG PO TABS: ORAL_TABLET | ORAL | 1 refills | Status: DC

## 2022-05-30 NOTE — Progress Notes (Signed)
Date:  05/30/2022   Name:  Luis Mcknight   DOB:  Mar 11, 1980   MRN:  [id number]   Chief Complaint: Prediabetes, Sinusitis (Cough with yellow production- ), and Hyperlipidemia (Started atorv last visit)  Sinusitis This is a new problem. The current episode started in the past 7 days. The problem has been waxing and waning since onset. There has been no fever. The pain is moderate. Associated symptoms include chills, congestion, coughing, sinus pressure, sneezing and a sore throat. Pertinent negatives include no ear pain, headaches, neck pain or shortness of breath. Past treatments include acetaminophen. The treatment provided moderate relief.  Hyperlipidemia This is a chronic problem. The current episode started more than 1 year ago. The problem is controlled. Recent lipid tests were reviewed and are variable. He has no history of diabetes. Pertinent negatives include no chest pain, myalgias or shortness of breath. Current antihyperlipidemic treatment includes statins.  Diabetes He presents for his follow-up diabetic visit. Diabetes type: prediabetes. Pertinent negatives for hypoglycemia include no dizziness, headaches or nervousness/anxiousness. Pertinent negatives for diabetes include no chest pain, no foot ulcerations, no polydipsia, no polyuria and no weight loss. There are no hypoglycemic complications. Symptoms are stable. There are no diabetic complications. Current diabetic treatment includes diet. He is following a generally unhealthy diet. Meal planning includes avoidance of concentrated sweets and carbohydrate counting.    Lab Results  Component Value Date   NA 140 02/15/2022   K 4.4 02/15/2022   CO2 26 02/15/2022   GLUCOSE 90 02/15/2022   BUN 13 02/15/2022   CREATININE 1.15 02/15/2022   CALCIUM 9.8 02/15/2022   EGFR 84 02/15/2022   GFRNONAA 99 04/03/2019   Lab Results  Component Value Date   CHOL 200 (H) 02/15/2022   HDL 34 (L) 02/15/2022   LDLCALC 127 (H) 02/15/2022    TRIG 220 (H) 02/15/2022   No results found for: "TSH" Lab Results  Component Value Date   HGBA1C 6.3 (H) 02/15/2022   Lab Results  Component Value Date   WBC 7.1 01/01/2019   HGB 14.7 01/01/2019   HCT 45.7 01/01/2019   MCV 80.3 01/01/2019   PLT 237 01/01/2019   Lab Results  Component Value Date   ALT 31 02/15/2022   AST 25 02/15/2022   ALKPHOS 87 02/15/2022   BILITOT 0.3 02/15/2022   No results found for: "25OHVITD2", "25OHVITD3", "VD25OH"   Review of Systems  Constitutional:  Positive for chills. Negative for fever and weight loss.  HENT:  Positive for congestion, sinus pressure, sneezing and sore throat. Negative for drooling, ear discharge and ear pain.   Respiratory:  Positive for cough. Negative for shortness of breath and wheezing.   Cardiovascular:  Negative for chest pain, palpitations and leg swelling.  Gastrointestinal:  Negative for abdominal pain, blood in stool, constipation, diarrhea and nausea.  Endocrine: Negative for polydipsia and polyuria.  Genitourinary:  Positive for frequency. Negative for difficulty urinating, dysuria, hematuria and urgency.  Musculoskeletal:  Negative for back pain, myalgias and neck pain.  Skin:  Negative for rash.  Allergic/Immunologic: Negative for environmental allergies.  Neurological:  Negative for dizziness and headaches.  Hematological:  Does not bruise/bleed easily.  Psychiatric/Behavioral:  Negative for suicidal ideas. The patient is not nervous/anxious.     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   Lumbar spondylosis 06/01/2020   Plantar  fasciitis, 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   Chest pain of uncertain etiology [id number]   OSA (obstructive sleep apnea) 06/04/2019   Current smoker 06/04/2019   Healthcare maintenance 06/04/2019   Acute pericarditis 04/03/2019   Sarcoidosis 10/16/2018   Lung nodules    Adenopathy    Essential hypertension 11/28/2017    Allergies  Allergen Reactions   Latex Itching and Rash    GLOVES   Pistachio Nut Extract Nausea Only    Past Surgical History:  Procedure Laterality Date   ELECTROMAGNETIC NAVIGATION BROCHOSCOPY Left 10/08/2018   Procedure: ELECTROMAGNETIC NAVIGATION BRONCHOSCOPY, LEFT;  Surgeon: Tyler Pita, MD;  Location: [facility] ORS;  Service: Cardiopulmonary;  Laterality: Left;   ENDOBRONCHIAL ULTRASOUND Left 10/08/2018   Procedure: ENDOBRONCHIAL ULTRASOUND, LEFT;  Surgeon: Tyler Pita, MD;  Location: [facility];  Service: Cardiopulmonary;  Laterality: Left;   NO PAST SURGERIES      Social History   Tobacco Use   Smoking status: Every Day    Packs/day: 0.50    Years: 20.00    Additional pack years: 0.00    Total pack years: 10.00    Types: Cigarettes    Start date: 03/07/1998   Smokeless tobacco: Never   Tobacco comments:    0.5PPD 11/04/2021    0.25 PPD 03/04/2022  Vaping Use   Vaping Use: Former   Start date: 04/18/2012   Quit date: 04/18/2016  Substance Use Topics   Alcohol use: Not Currently   Drug use: Not Currently     Medication list has been reviewed and updated.  Current Meds  Medication Sig   amitriptyline (ELAVIL) 50 MG tablet Take 1 tablet by mouth at bedtime.   amLODipine (NORVASC) 10 MG tablet TAKE 1 TABLET(10 MG) BY MOUTH EVERY MORNING   ARIPiprazole (ABILIFY) 2 MG tablet Take 2 mg by mouth daily. psych   atorvastatin (LIPITOR) 10 MG tablet TAKE 1 TABLET(10 MG) BY MOUTH DAILY   blood glucose meter kit and supplies Dispense based on patient and insurance preference. Use up to four times daily as directed. (FOR ICD-10 E10.9, E11.9).   chlorhexidine  (PERIDEX) 0.12 % solution 15 mLs 2 (two) times daily.   FLUoxetine (PROZAC) 40 MG capsule Take 40 mg by mouth every morning. psych   hydrocortisone 2.5 % cream Apply topically See admin instructions. Can mix with ketoconazole cream and use twice daily as needed for itchy flared areas. D/c when no longer flared.   meloxicam (MOBIC) 15 MG tablet Take 15 mg by mouth daily.   metoprolol succinate (TOPROL XL) 25 MG 24 hr tablet Take 1 tablet (25 mg total) by mouth daily.   Multiple Vitamin (MULTI-VITAMIN) tablet Take 1 tablet by mouth daily.   pantoprazole (PROTONIX) 20 MG tablet TAKE 1 TABLET(20 MG) BY MOUTH DAILY   pregabalin (LYRICA) 100 MG capsule Take 1 capsule (100 mg total) by mouth 3 (three) times daily. (Patient taking differently: Take 100 mg by mouth 3 (three) times daily. Melrose Nakayama)   tiZANidine (ZANAFLEX) 4 MG tablet Take 1 tablet by mouth 2 (two) times daily.   triamcinolone cream (KENALOG) 0.1 % Apply to aa of arms and legs bid for itchy rash. Avoid applying to face, groin, and axilla. Use as directed.   triamterene-hydrochlorothiazide (MAXZIDE) 75-50 MG tablet Take 1 tablet by mouth daily.   UNABLE TO FIND CPAP  AT NIGHT       05/30/2022   10:49 AM 02/15/2022    9:55 AM 09/30/2021   10:29 AM 02/19/2021    9:05 AM  GAD 7 : Generalized Anxiety Score  Nervous, Anxious, on Edge 0 0 3 1  Control/stop worrying 0 0 3 3  Worry too much - different things 0 0 3 3  Trouble relaxing 0 0 3 2  Restless 0 0 3 1  Easily annoyed or irritable 0 0 1 3  Afraid - awful might happen 0 0 3 1  Total GAD 7 Score 0 0 19 14  Anxiety Difficulty Not difficult at all Not difficult at all Very difficult Not difficult at all       05/30/2022   10:49 AM 02/15/2022    9:55 AM 09/30/2021   10:28 AM  Depression screen PHQ 2/9  Decreased Interest 0 0 3  Down, Depressed, Hopeless 0 0 3  PHQ - 2 Score 0 0 6  Altered sleeping 0 0 1  Tired, decreased energy 0 0 2  Change in appetite 0 0 1  Feeling bad or  failure about yourself  0 0 3  Trouble concentrating 0 0 3  Moving slowly or fidgety/restless 0 0 3  Suicidal thoughts 0 0 0  PHQ-9 Score 0 0 19  Difficult doing work/chores Not difficult at all Not difficult at all Somewhat difficult    BP Readings from Last 3 Encounters:  05/30/22 128/70  03/04/22 120/80  02/15/22 118/78    Physical Exam Vitals and nursing note reviewed.  HENT:     Head: Normocephalic.     Right Ear: Tympanic membrane and external ear normal.     Left Ear: Tympanic membrane and external ear normal.     Nose:     Right Sinus: Maxillary sinus tenderness present.     Left Sinus: Maxillary sinus tenderness present.     Mouth/Throat:     Mouth: Mucous membranes are moist.  Eyes:     General: No scleral icterus.       Right eye: No discharge.        Left eye: No discharge.     Conjunctiva/sclera: Conjunctivae normal.     Pupils: Pupils are equal, round, and reactive to light.  Neck:     Thyroid: No thyromegaly.     Vascular: No JVD.     Trachea: No tracheal deviation.  Cardiovascular:     Rate and Rhythm: Normal rate and regular rhythm.     Heart sounds: Normal heart sounds. No murmur heard.    No friction rub. No gallop.  Pulmonary:     Effort: No respiratory distress.     Breath sounds: Normal breath sounds. No transmitted upper airway sounds. No decreased breath sounds, wheezing, rhonchi or rales.  Abdominal:     General: Bowel sounds are normal.     Palpations: Abdomen is soft. There is no mass.     Tenderness: There is no abdominal tenderness. There is no guarding or rebound.  Musculoskeletal:        General: No tenderness. Normal range of motion.     Cervical back: Normal range of motion and neck supple.  Lymphadenopathy:     Cervical: No cervical adenopathy.  Skin:    General: Skin is warm.     Findings: No rash.  Neurological:     Mental Status: He is alert and oriented to person, place, and time.     Cranial Nerves:  No cranial nerve  deficit.     Deep Tendon Reflexes: Reflexes are normal and symmetric.    Wt Readings from Last 3 Encounters:  05/30/22 255 lb (115.7 kg)  03/04/22 251 lb 12.8 oz (114.2 kg)  02/15/22 251 lb (113.9 kg)    BP 128/70   Pulse 90   Temp 99 F (37.2 C) (Oral)   Ht 6\' 2"  (1.88 m)   Wt 255 lb (115.7 kg)   SpO2 98%   BMI 32.74 kg/m   Assessment and Plan:  1. Acute maxillary sinusitis, recurrence not specified New onset.  Persistent.  Stable.  Exam and physical evaluation consistent with maxillary sinusitis.  Will treat with amoxicillin 500 mg 3 times a day for 10 days. - amoxicillin (AMOXIL) 500 MG capsule; Take 1 capsule (500 mg total) by mouth 3 (three) times daily for 10 days.  Dispense: 30 capsule; Refill: 0  2. Essential hypertension Chronic.  Controlled.  Stable.  Blood pressure 128/70.  Will continue with current treatment with continued amlodipine 10 mg and triamterene hydrochlorothiazide 75 5-50 mg. - Comprehensive Metabolic Panel (CMET)  3. Prediabetes Chronic.  Diet controlled.  Stable.  Will check A1c for current status along with comprehensive metabolic panel for electrolytes and GFR. - HgB A1c - Comprehensive Metabolic Panel (CMET)  4. Hyperlipidemia, unspecified hyperlipidemia type Chronic.  Controlled.  Stable.  Continue atorvastatin 10 mg once a day.  Will check lipid panel for current level of control. - Lipid Panel With LDL/HDL Ratio - atorvastatin (LIPITOR) 10 MG tablet; TAKE 1 TABLET(10 MG) BY MOUTH DAILY  Dispense: 90 tablet; Refill: 1  5. Acute cough Patient with acute cough and I recommended that he pick up Mucinex DM. - POC COVID-19    Otilio Miu, MD

## 2022-05-31 LAB — LIPID PANEL WITH LDL/HDL RATIO
Cholesterol, Total: 132 mg/dL (ref 100–199)
HDL: 33 mg/dL — ABNORMAL LOW (ref >39)
LDL Chol Calc (NIH): 55 mg/dL (ref 0–99)
LDL/HDL Ratio: 1.7 ratio (ref 0.0–3.6)
Triglycerides: 283 mg/dL — ABNORMAL HIGH (ref 0–149)
VLDL Cholesterol Cal: 44 mg/dL — ABNORMAL HIGH (ref 5–40)

## 2022-05-31 LAB — COMPREHENSIVE METABOLIC PANEL
ALT: 30 IU/L (ref 0–44)
AST: 23 IU/L (ref 0–40)
Albumin/Globulin Ratio: 1.7 (ref 1.2–2.2)
Albumin: 4.7 g/dL (ref 4.1–5.1)
Alkaline Phosphatase: 122 IU/L — ABNORMAL HIGH (ref 44–121)
BUN/Creatinine Ratio: 11 (ref 9–20)
BUN: 13 mg/dL (ref 6–20)
Bilirubin Total: 0.4 mg/dL (ref 0.0–1.2)
CO2: 24 mmol/L (ref 20–29)
Calcium: 9.7 mg/dL (ref 8.7–10.2)
Chloride: 100 mmol/L (ref 96–106)
Creatinine, Ser: 1.17 mg/dL (ref 0.76–1.27)
Globulin, Total: 2.8 g/dL (ref 1.5–4.5)
Glucose: 119 mg/dL — ABNORMAL HIGH (ref 70–99)
Potassium: 3.7 mmol/L (ref 3.5–5.2)
Sodium: 142 mmol/L (ref 134–144)
Total Protein: 7.5 g/dL (ref 6.0–8.5)
eGFR: 81 mL/min/{1.73_m2} (ref >59)

## 2022-05-31 LAB — HEMOGLOBIN A1C
Est. average glucose Bld gHb Est-mCnc: 148 mg/dL
Hgb A1c MFr Bld: 6.8 % — ABNORMAL HIGH (ref 4.8–5.6)

## 2022-06-01 ENCOUNTER — Other Ambulatory Visit: Payer: Self-pay

## 2022-06-01 DIAGNOSIS — E1169 Type 2 diabetes mellitus with other specified complication: Secondary | ICD-10-CM

## 2022-06-01 MED ORDER — METFORMIN HCL 500 MG PO TABS: 500.0000 mg | ORAL_TABLET | Freq: Every day | ORAL | 0 refills | Status: DC

## 2022-06-29 DIAGNOSIS — M722 Plantar fascial fibromatosis: Secondary | ICD-10-CM | POA: Diagnosis not present

## 2022-06-29 DIAGNOSIS — M7732 Calcaneal spur, left foot: Secondary | ICD-10-CM | POA: Diagnosis not present

## 2022-06-29 DIAGNOSIS — M79672 Pain in left foot: Secondary | ICD-10-CM | POA: Diagnosis not present

## 2022-06-29 DIAGNOSIS — M7731 Calcaneal spur, right foot: Secondary | ICD-10-CM | POA: Diagnosis not present

## 2022-06-29 DIAGNOSIS — M79671 Pain in right foot: Secondary | ICD-10-CM | POA: Diagnosis not present

## 2022-07-15 ENCOUNTER — Encounter: Payer: Self-pay | Admitting: Family Medicine

## 2022-07-18 ENCOUNTER — Other Ambulatory Visit: Payer: Self-pay | Admitting: Family Medicine

## 2022-07-18 DIAGNOSIS — K219 Gastro-esophageal reflux disease without esophagitis: Secondary | ICD-10-CM

## 2022-07-18 MED ORDER — PANTOPRAZOLE SODIUM 20 MG PO TBEC: DELAYED_RELEASE_TABLET | ORAL | 0 refills | Status: DC

## 2022-07-18 NOTE — Telephone Encounter (Signed)
Accu Check test strips not on current medication list- patient will need new Rx for this. (Testing 2-3 times/day)

## 2022-07-18 NOTE — Telephone Encounter (Signed)
Medication Refill - Medication: pantoprazole (PROTONIX) 20 MG tablet   Also needs more strips for his glucose testing supplies, says he was told to check his sugar 2-3 times a day. "AccuCheck Test strips"  Has the patient contacted their pharmacy? Yes.   (Agent: If no, request that the patient contact the pharmacy for the refill. If patient does not wish to contact the pharmacy document the reason why and proceed with request.) (Agent: If yes, when and what did the pharmacy advise?)  Preferred Pharmacy (with phone number or street name):  [id number], [place] - [address] AT [facility] OF [address]  [address] [place] [address]  Phone: [phone] Fax: [phone]   Has the patient been seen for an appointment in the last year OR does the patient have an upcoming appointment? Yes.    Agent: Please be advised that RX refills may take up to 3 business days. We ask that you follow-up with your pharmacy.

## 2022-07-21 ENCOUNTER — Other Ambulatory Visit: Payer: Self-pay | Admitting: Family Medicine

## 2022-07-22 NOTE — Telephone Encounter (Signed)
Requested medication (s) are due for refill today: -  Requested medication (s) are on the active medication list: no  Last refill:  unknown  Future visit scheduled: yes  Notes to clinic:  need order for test strips    Requested Prescriptions  Pending Prescriptions Disp Refills   ACCU-CHEK GUIDE test strip [Pharmacy Med Name: ACCU-CHEK GUIDE TEST STRIPS 100S] 100 strip     Sig: USE ONCE DAILY     Endocrinology: Diabetes - Testing Supplies Passed - 07/21/2022  8:50 PM      Passed - Valid encounter within last 12 months    Recent Outpatient Visits           1 month ago Acute maxillary sinusitis, recurrence not specified   [place] [facility]   5 months ago Essential hypertension   [place] [facility]   9 months ago Prediabetes   [facility]   1 year ago Prediabetes   [facility]   1 year ago Essential hypertension   [place] [facility]       Future Appointments             In 3 weeks Duanne Limerick, MD [facility] at [facility], [facility]

## 2022-08-05 ENCOUNTER — Other Ambulatory Visit: Payer: Self-pay

## 2022-08-05 ENCOUNTER — Other Ambulatory Visit: Payer: Self-pay | Admitting: Family Medicine

## 2022-08-05 ENCOUNTER — Telehealth: Payer: Self-pay | Admitting: Family Medicine

## 2022-08-05 ENCOUNTER — Telehealth: Payer: Self-pay

## 2022-08-05 DIAGNOSIS — F4323 Adjustment disorder with mixed anxiety and depressed mood: Secondary | ICD-10-CM

## 2022-08-05 NOTE — Telephone Encounter (Signed)
Copied from CRM [id number]. Topic: Referral - Request for Referral >> Aug 05, 2022  9:26 AM Franchot Heidelberg wrote: Has patient seen PCP for this complaint? Yes.   *If NO, is insurance requiring patient see PCP for this issue before PCP can refer them? Referral for which specialty: Psychology/Therapy  Preferred provider/office: [place] Reason for referral: Required by insurance, recommended by his psychiatrist.

## 2022-08-05 NOTE — Progress Notes (Signed)
Placed ref to psychology/ therapist

## 2022-08-05 NOTE — Telephone Encounter (Signed)
Requested medications are due for refill today.  unsure  Requested medications are on the active medications list.  no  Last refill. Never ordered  Future visit scheduled.   yes  Notes to clinic.  Please review for order.    Requested Prescriptions  Pending Prescriptions Disp Refills   Accu-Chek Softclix Lancets lancets [Pharmacy Med Name: SOFTCLIX LANCETS] 100 each     Sig: USE ONCE DAILY     Endocrinology: Diabetes - Testing Supplies Passed - 08/05/2022 11:11 AM      Passed - Valid encounter within last 12 months    Recent Outpatient Visits           2 months ago Acute maxillary sinusitis, recurrence not specified   [place] [facility]   5 months ago Essential hypertension   [place] [facility]   10 months ago Prediabetes   [facility]   1 year ago Prediabetes   [facility]   1 year ago Essential hypertension   [place] [facility]       Future Appointments             In 1 week Duanne Limerick, MD [facility] Medicine at [facility], [facility]

## 2022-08-05 NOTE — Telephone Encounter (Signed)
Pt called requesting ref to therapist per his psychiatrist. I sent referral to [facility] in [place]. Pt is going to call and make sure they take his ins. He will call me back if not. I asked Lowella Bandy to hold off sending until after lunch.

## 2022-08-17 ENCOUNTER — Ambulatory Visit: Payer: Medicaid Other | Admitting: Family Medicine

## 2022-08-17 DIAGNOSIS — F4321 Adjustment disorder with depressed mood: Secondary | ICD-10-CM | POA: Diagnosis not present

## 2022-08-18 ENCOUNTER — Ambulatory Visit: Payer: Medicaid Other | Admitting: Family Medicine

## 2022-08-22 ENCOUNTER — Ambulatory Visit: Payer: Medicaid Other | Admitting: Family Medicine

## 2022-08-22 ENCOUNTER — Other Ambulatory Visit: Payer: Self-pay | Admitting: Family Medicine

## 2022-08-22 DIAGNOSIS — E1169 Type 2 diabetes mellitus with other specified complication: Secondary | ICD-10-CM

## 2022-08-22 DIAGNOSIS — K219 Gastro-esophageal reflux disease without esophagitis: Secondary | ICD-10-CM

## 2022-08-22 NOTE — Telephone Encounter (Signed)
Requested medications are due for refill today.  yes  Requested medications are on the active medications list.  yes  Last refill. 06/01/2022 #90 0 rf  Future visit scheduled.   yes  Notes to clinic.  Labs are expired.    Requested Prescriptions  Pending Prescriptions Disp Refills   metFORMIN (GLUCOPHAGE) 500 MG tablet [Pharmacy Med Name: METFORMIN 500MG  TABLETS] 90 tablet 0    Sig: TAKE 1 TABLET(500 MG) BY MOUTH DAILY     Endocrinology:  Diabetes - Biguanides Failed - 08/22/2022  6:30 AM      Failed - B12 Level in normal range and within 720 days    No results found for: "VITAMINB12"       Failed - CBC within normal limits and completed in the last 12 months    WBC  Date Value Ref Range Status  01/01/2019 7.1 4.0 - 10.5 K/uL Final   RBC  Date Value Ref Range Status  01/01/2019 5.69 4.22 - 5.81 MIL/uL Final   Hemoglobin  Date Value Ref Range Status  01/01/2019 14.7 13.0 - 17.0 g/dL Final   HCT  Date Value Ref Range Status  01/01/2019 45.7 39.0 - 52.0 % Final   MCHC  Date Value Ref Range Status  01/01/2019 32.2 30.0 - 36.0 g/dL Final   [facility]  Date Value Ref Range Status  01/01/2019 25.8 (L) 26.0 - 34.0 pg Final   MCV  Date Value Ref Range Status  01/01/2019 80.3 80.0 - 100.0 fL Final   No results found for: "PLTCOUNTKUC", "LABPLAT", "POCPLA" RDW  Date Value Ref Range Status  01/01/2019 14.2 11.5 - 15.5 % Final         Passed - Cr in normal range and within 360 days    Creatinine, Ser  Date Value Ref Range Status  05/30/2022 1.17 0.76 - 1.27 mg/dL Final         Passed - HBA1C is between 0 and 7.9 and within 180 days    Hgb A1c MFr Bld  Date Value Ref Range Status  05/30/2022 6.8 (H) 4.8 - 5.6 % Final    Comment:             Prediabetes: 5.7 - 6.4          Diabetes: >6.4          Glycemic control for adults with diabetes: <7.0          Passed - eGFR in normal range and within 360 days    GFR calc Af Amer  Date Value Ref Range Status  04/03/2019  114 >59 mL/min/1.73 Final   GFR calc non Af Amer  Date Value Ref Range Status  04/03/2019 99 >59 mL/min/1.73 Final   eGFR  Date Value Ref Range Status  05/30/2022 81 >59 mL/min/1.73 Final         Passed - Valid encounter within last 6 months    Recent Outpatient Visits           2 months ago Acute maxillary sinusitis, recurrence not specified   [place] [facility]   6 months ago Essential hypertension   [place] [facility]   10 months ago Prediabetes   [facility]   1 year ago Prediabetes   [facility] at [facility]   1 year ago Essential hypertension   [place] [facility]       Future Appointments             In 1 week Duanne Limerick, MD [facility] Medicine at [facility], [facility]   In 4 months Duanne Limerick, MD [facility] at [facility], [facility]            Signed Prescriptions Disp Refills   pantoprazole (PROTONIX) 20 MG tablet 90 tablet 0    Sig: TAKE 1 TABLET(20 MG) BY MOUTH DAILY     Gastroenterology: Proton Pump Inhibitors Passed - 08/22/2022  6:30 AM      Passed - Valid encounter within last 12 months    Recent Outpatient Visits           2 months ago Acute maxillary sinusitis, recurrence not specified   [place] [facility]   6 months ago Essential hypertension   [place] [facility]   10 months ago Prediabetes   [facility]   1 year ago Prediabetes   [facility]  at [facility]   1 year ago Essential hypertension   [place] [facility]       Future Appointments             In 1 week Duanne Limerick, MD [facility] at [facility], [facility]   In 4 months Duanne Limerick, MD [facility] at [facility], [facility]

## 2022-08-22 NOTE — Telephone Encounter (Signed)
Requested Prescriptions  Pending Prescriptions Disp Refills   metFORMIN (GLUCOPHAGE) 500 MG tablet [Pharmacy Med Name: METFORMIN 500MG  TABLETS] 90 tablet 0    Sig: TAKE 1 TABLET(500 MG) BY MOUTH DAILY     Endocrinology:  Diabetes - Biguanides Failed - 08/22/2022  6:30 AM      Failed - B12 Level in normal range and within 720 days    No results found for: "VITAMINB12"       Failed - CBC within normal limits and completed in the last 12 months    WBC  Date Value Ref Range Status  01/01/2019 7.1 4.0 - 10.5 K/uL Final   RBC  Date Value Ref Range Status  01/01/2019 5.69 4.22 - 5.81 MIL/uL Final   Hemoglobin  Date Value Ref Range Status  01/01/2019 14.7 13.0 - 17.0 g/dL Final   HCT  Date Value Ref Range Status  01/01/2019 45.7 39.0 - 52.0 % Final   MCHC  Date Value Ref Range Status  01/01/2019 32.2 30.0 - 36.0 g/dL Final   [facility]  Date Value Ref Range Status  01/01/2019 25.8 (L) 26.0 - 34.0 pg Final   MCV  Date Value Ref Range Status  01/01/2019 80.3 80.0 - 100.0 fL Final   No results found for: "PLTCOUNTKUC", "LABPLAT", "POCPLA" RDW  Date Value Ref Range Status  01/01/2019 14.2 11.5 - 15.5 % Final         Passed - Cr in normal range and within 360 days    Creatinine, Ser  Date Value Ref Range Status  05/30/2022 1.17 0.76 - 1.27 mg/dL Final         Passed - HBA1C is between 0 and 7.9 and within 180 days    Hgb A1c MFr Bld  Date Value Ref Range Status  05/30/2022 6.8 (H) 4.8 - 5.6 % Final    Comment:             Prediabetes: 5.7 - 6.4          Diabetes: >6.4          Glycemic control for adults with diabetes: <7.0          Passed - eGFR in normal range and within 360 days    GFR calc Af Amer  Date Value Ref Range Status  04/03/2019 114 >59 mL/min/1.73 Final   GFR calc non Af Amer  Date Value Ref Range Status  04/03/2019 99 >59 mL/min/1.73 Final   eGFR  Date Value Ref Range Status  05/30/2022 81 >59 mL/min/1.73 Final         Passed - Valid encounter  within last 6 months    Recent Outpatient Visits           2 months ago Acute maxillary sinusitis, recurrence not specified   [place] [facility]   6 months ago Essential hypertension   [place] [facility]   10 months ago Prediabetes   [facility]   1 year ago Prediabetes   [facility]   1 year ago Essential hypertension   [place] [facility]       Future Appointments  In 1 week Duanne Limerick, MD [facility] at [facility]   In 4 months Duanne Limerick, MD [facility]             pantoprazole (PROTONIX) 20 MG tablet [Pharmacy Med Name: PANTOPRAZOLE 20MG  TABLETS] 90 tablet 0    Sig: TAKE 1 TABLET(20 MG) BY MOUTH DAILY     Gastroenterology: Proton Pump Inhibitors Passed - 08/22/2022  6:30 AM      Passed - Valid encounter within last 12 months    Recent Outpatient Visits           2 months ago Acute maxillary sinusitis, recurrence not specified   [place] [facility]   6 months ago Essential hypertension   [place] [facility]   10 months ago Prediabetes   [facility]   1 year ago Prediabetes   [facility]   1 year ago Essential hypertension   [place] [facility]       Future Appointments             In 1 week  Duanne Limerick, MD [facility] at [facility]   In 4 months Duanne Limerick, MD [facility] at [facility]

## 2022-08-27 DIAGNOSIS — F4321 Adjustment disorder with depressed mood: Secondary | ICD-10-CM | POA: Diagnosis not present

## 2022-08-29 ENCOUNTER — Encounter: Payer: Self-pay | Admitting: Family Medicine

## 2022-08-29 ENCOUNTER — Ambulatory Visit (INDEPENDENT_AMBULATORY_CARE_PROVIDER_SITE_OTHER): Payer: Medicaid Other | Admitting: Family Medicine

## 2022-08-29 VITALS — BP 120/78 | HR 80 | Ht 74.0 in | Wt 243.0 lb

## 2022-08-29 DIAGNOSIS — K219 Gastro-esophageal reflux disease without esophagitis: Secondary | ICD-10-CM

## 2022-08-29 DIAGNOSIS — Z7984 Long term (current) use of oral hypoglycemic drugs: Secondary | ICD-10-CM | POA: Diagnosis not present

## 2022-08-29 DIAGNOSIS — I10 Essential (primary) hypertension: Secondary | ICD-10-CM

## 2022-08-29 DIAGNOSIS — E785 Hyperlipidemia, unspecified: Secondary | ICD-10-CM | POA: Diagnosis not present

## 2022-08-29 DIAGNOSIS — E1169 Type 2 diabetes mellitus with other specified complication: Secondary | ICD-10-CM

## 2022-08-29 MED ORDER — METFORMIN HCL 500 MG PO TABS: 500.0000 mg | ORAL_TABLET | Freq: Every day | ORAL | 1 refills | Status: DC

## 2022-08-29 NOTE — Patient Instructions (Signed)

## 2022-08-29 NOTE — Progress Notes (Signed)
Date:  08/29/2022   Name:  Luis Mcknight   DOB:  15-Nov-1980   MRN:  [id number]   Chief Complaint: Diabetes  Diabetes He presents for his follow-up diabetic visit. He has type 2 diabetes mellitus. His disease course has been stable. There are no hypoglycemic associated symptoms. Pertinent negatives for hypoglycemia include no dizziness. Associated symptoms include blurred vision. Pertinent negatives for diabetes include no chest pain, no fatigue, no foot paresthesias, no foot ulcerations, no polydipsia, no polyuria, no visual change, no weakness and no weight loss. There are no hypoglycemic complications. Symptoms are stable. There are no diabetic complications. Current diabetic treatment includes oral agent (monotherapy). He is compliant with treatment most of the time. He is following a generally healthy diet. Meal planning includes avoidance of concentrated sweets and carbohydrate counting. An ACE inhibitor/angiotensin II receptor blocker is being taken.  Hypertension This is a chronic problem. The problem has been gradually improving since onset. Associated symptoms include blurred vision. Pertinent negatives include no chest pain, orthopnea, palpitations, PND or shortness of breath.  Hyperlipidemia This is a chronic problem. The current episode started more than 1 year ago. The problem is controlled. Pertinent negatives include no chest pain, myalgias or shortness of breath. Current antihyperlipidemic treatment includes statins. The current treatment provides moderate improvement of lipids.    Lab Results  Component Value Date   NA 142 05/30/2022   K 3.7 05/30/2022   CO2 24 05/30/2022   GLUCOSE 119 (H) 05/30/2022   BUN 13 05/30/2022   CREATININE 1.17 05/30/2022   CALCIUM 9.7 05/30/2022   EGFR 81 05/30/2022   GFRNONAA 99 04/03/2019   Lab Results  Component Value Date   CHOL 132 05/30/2022   HDL 33 (L) 05/30/2022   LDLCALC 55 05/30/2022   TRIG 283 (H) 05/30/2022   No results  found for: "TSH" Lab Results  Component Value Date   HGBA1C 6.8 (H) 05/30/2022   Lab Results  Component Value Date   WBC 7.1 01/01/2019   HGB 14.7 01/01/2019   HCT 45.7 01/01/2019   MCV 80.3 01/01/2019   PLT 237 01/01/2019   Lab Results  Component Value Date   ALT 30 05/30/2022   AST 23 05/30/2022   ALKPHOS 122 (H) 05/30/2022   BILITOT 0.4 05/30/2022   No results found for: "25OHVITD2", "25OHVITD3", "VD25OH"   Review of Systems  Constitutional:  Negative for fatigue and weight loss.  HENT:  Negative for congestion, postnasal drip and trouble swallowing.   Eyes:  Positive for blurred vision and visual disturbance.  Respiratory:  Positive for chest tightness. Negative for shortness of breath and wheezing.   Cardiovascular:  Negative for chest pain, palpitations, orthopnea, leg swelling and PND.  Gastrointestinal:  Negative for abdominal pain, blood in stool and constipation.  Endocrine: Negative for polydipsia and polyuria.  Genitourinary:  Negative for difficulty urinating.  Musculoskeletal:  Negative for arthralgias and myalgias.       Cervical radiculopathy  Neurological:  Negative for dizziness, weakness and numbness.  Hematological:  Negative for adenopathy. Does not bruise/bleed easily.    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   Lumbar spondylosis 06/01/2020   Plantar  fasciitis, 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   Chest pain of uncertain etiology 09/04/2019   OSA (obstructive sleep apnea) 06/04/2019   Current smoker 06/04/2019   Healthcare maintenance 06/04/2019   Acute pericarditis 04/03/2019    Sarcoidosis 10/16/2018   Lung nodules    Adenopathy    Essential hypertension 11/28/2017    Allergies  Allergen Reactions   Latex Itching and Rash    GLOVES   Pistachio Nut Extract Nausea Only    Past Surgical History:  Procedure Laterality Date   ELECTROMAGNETIC NAVIGATION BROCHOSCOPY Left 10/08/2018   Procedure: ELECTROMAGNETIC NAVIGATION BRONCHOSCOPY, LEFT;  Surgeon: Salena Saner, MD;  Location: [facility] ORS;  Service: Cardiopulmonary;  Laterality: Left;   ENDOBRONCHIAL ULTRASOUND Left 10/08/2018   Procedure: ENDOBRONCHIAL ULTRASOUND, LEFT;  Surgeon: Salena Saner, MD;  Location: [facility] ORS;  Service: Cardiopulmonary;  Laterality: Left;   NO PAST SURGERIES      Social History   Tobacco Use   Smoking status: Every Day    Packs/day: 0.50    Years: 20.00    Additional pack years: 0.00    Total pack years: 10.00    Types: Cigarettes    Start date: 03/07/1998   Smokeless tobacco: Never   Tobacco comments:    0.5PPD 11/04/2021    0.25 PPD 03/04/2022  Vaping Use   Vaping Use: Former   Start date: 04/18/2012   Quit date: 04/18/2016  Substance Use Topics   Alcohol use: Not Currently   Drug use: Not Currently     Medication list has been reviewed and updated.  Current Meds  Medication Sig   Accu-Chek Softclix Lancets lancets USE ONCE DAILY   amitriptyline (ELAVIL) 50 MG tablet Take 1 tablet by mouth at bedtime.   amLODipine (NORVASC) 10 MG tablet TAKE 1 TABLET(10 MG) BY MOUTH EVERY MORNING   ARIPiprazole (ABILIFY) 2 MG tablet Take 2 mg by mouth daily. psych   atorvastatin (LIPITOR) 10 MG tablet TAKE 1 TABLET(10 MG) BY MOUTH DAILY   blood glucose meter kit and supplies Dispense based on patient and insurance preference. Use up to four times daily as directed. (FOR ICD-10 E10.9, E11.9).   chlorhexidine (PERIDEX) 0.12 % solution 15 mLs 2 (two) times daily.   FLUoxetine (PROZAC) 40 MG capsule Take 40 mg by mouth every morning. psych   glucose blood (ACCU-CHEK GUIDE) test  strip USE ONCE DAILY   hydrocortisone 2.5 % cream Apply topically See admin instructions. Can mix with ketoconazole cream and use twice daily as needed for itchy flared areas. D/c when no longer flared.   meloxicam (MOBIC) 15 MG tablet Take 15 mg by mouth daily.   metFORMIN (GLUCOPHAGE) 500 MG tablet Take 1 tablet (500 mg total) by mouth daily.   metoprolol succinate (TOPROL XL) 25 MG 24 hr tablet Take 1 tablet (25 mg total) by mouth daily.   Multiple Vitamin (MULTI-VITAMIN) tablet Take 1 tablet by mouth daily.   pantoprazole (PROTONIX) 20 MG tablet TAKE 1 TABLET(20 MG) BY MOUTH DAILY   tiZANidine (ZANAFLEX) 4 MG tablet Take 1 tablet by mouth 2 (two) times daily.   triamcinolone cream (KENALOG) 0.1 % Apply to aa of arms and legs bid for itchy rash. Avoid applying to face, groin, and axilla. Use as directed.   triamterene-hydrochlorothiazide (MAXZIDE) 75-50 MG tablet Take 1 tablet by mouth daily.   UNABLE TO FIND CPAP AT NIGHT       08/29/2022   10:08 AM 05/30/2022   10:49 AM 02/15/2022    9:55 AM 09/30/2021  10:29 AM  GAD 7 : Generalized Anxiety Score  Nervous, Anxious, on Edge 0 0 0 3  Control/stop worrying 0 0 0 3  Worry too much - different things 0 0 0 3  Trouble relaxing 0 0 0 3  Restless 0 0 0 3  Easily annoyed or irritable 0 0 0 1  Afraid - awful might happen 0 0 0 3  Total GAD 7 Score 0 0 0 19  Anxiety Difficulty Not difficult at all Not difficult at all Not difficult at all Very difficult       08/29/2022   10:07 AM 05/30/2022   10:49 AM 02/15/2022    9:55 AM  Depression screen PHQ 2/9  Decreased Interest 0 0 0  Down, Depressed, Hopeless 1 0 0  PHQ - 2 Score 1 0 0  Altered sleeping 0 0 0  Tired, decreased energy 0 0 0  Change in appetite 0 0 0  Feeling bad or failure about yourself  0 0 0  Trouble concentrating 0 0 0  Moving slowly or fidgety/restless 0 0 0  Suicidal thoughts 0 0 0  PHQ-9 Score 1 0 0  Difficult doing work/chores Not difficult at all Not difficult  at all Not difficult at all    BP Readings from Last 3 Encounters:  08/29/22 120/78  05/30/22 128/70  03/04/22 120/80    Physical Exam Vitals and nursing note reviewed.  HENT:     Head: Normocephalic.     Right Ear: Tympanic membrane and external ear normal.     Left Ear: Tympanic membrane and external ear normal.     Nose: Nose normal.  Eyes:     General: No scleral icterus.       Right eye: No discharge.        Left eye: No discharge.     Conjunctiva/sclera: Conjunctivae normal.     Pupils: Pupils are equal, round, and reactive to light.  Neck:     Thyroid: No thyroid mass, thyromegaly or thyroid tenderness.     Vascular: Normal carotid pulses. No carotid bruit, hepatojugular reflux or JVD.     Trachea: No tracheal deviation.  Cardiovascular:     Rate and Rhythm: Normal rate and regular rhythm.     Heart sounds: Normal heart sounds. No murmur heard.    No friction rub. No gallop.  Pulmonary:     Effort: No respiratory distress.     Breath sounds: Normal breath sounds. No wheezing or rales.  Abdominal:     General: Bowel sounds are normal.     Palpations: Abdomen is soft. There is no mass.     Tenderness: There is no abdominal tenderness. There is no guarding or rebound.  Musculoskeletal:        General: No tenderness. Normal range of motion.     Cervical back: Normal range of motion and neck supple.  Lymphadenopathy:     Cervical: No cervical adenopathy.  Skin:    General: Skin is warm.     Findings: No rash.  Neurological:     Mental Status: He is alert and oriented to person, place, and time.     Cranial Nerves: No cranial nerve deficit.     Deep Tendon Reflexes: Reflexes are normal and symmetric.     Wt Readings from Last 3 Encounters:  08/29/22 243 lb (110.2 kg)  05/30/22 255 lb (115.7 kg)  03/04/22 251 lb 12.8 oz (114.2 kg)    BP 120/78   Pulse  80   Ht 6\' 2"  (1.88 m)   Wt 243 lb (110.2 kg)   SpO2 99%   BMI 31.20 kg/m   Assessment and Plan: 1.  Type 2 diabetes mellitus with other specified complication, without long-term current use of insulin (HCC) Chronic.  Controlled.  Stable.  Patient currently is taking metformin 500 mg daily.  Will check A1c as well as microalbuminuria. - metFORMIN (GLUCOPHAGE) 500 MG tablet; Take 1 tablet (500 mg total) by mouth daily.  Dispense: 90 tablet; Refill: 1 - HgB A1c - Microalbumin / creatinine urine ratio  2. Essential hypertension Chronic.  Controlled.  Stable.  Blood pressure 120/78.  Continue present medical regimen consisting of amlodipine, metoprolol XL 25 and Maxide 75 mg daily.  3. Hyperlipidemia, unspecified hyperlipidemia type Chronic.  Controlled.  Stable.  Review of lipid panel is acceptable.  Reemphasized low dietary guidelines.  Will continue current dosing of atorvastatin 10 mg once a day.  Will recheck in 4 months.  4. Gastroesophageal reflux disease without esophagitis Chronic.  Controlled.  Stable.  Continue pantoprazole at current dosing 40 mg.    Elizabeth Sauer, MD

## 2022-08-31 LAB — MICROALBUMIN / CREATININE URINE RATIO
Creatinine, Urine: 180.6 mg/dL
Microalb/Creat Ratio: 4 mg/g creat (ref 0–29)
Microalbumin, Urine: 8 ug/mL

## 2022-08-31 LAB — HEMOGLOBIN A1C
Est. average glucose Bld gHb Est-mCnc: 134 mg/dL
Hgb A1c MFr Bld: 6.3 % — ABNORMAL HIGH (ref 4.8–5.6)

## 2022-09-09 ENCOUNTER — Encounter: Payer: Self-pay | Admitting: Pulmonary Disease

## 2022-09-17 DIAGNOSIS — F4321 Adjustment disorder with depressed mood: Secondary | ICD-10-CM | POA: Diagnosis not present

## 2022-09-25 DIAGNOSIS — G4733 Obstructive sleep apnea (adult) (pediatric): Secondary | ICD-10-CM | POA: Diagnosis not present

## 2022-09-29 DIAGNOSIS — G4733 Obstructive sleep apnea (adult) (pediatric): Secondary | ICD-10-CM | POA: Diagnosis not present

## 2022-10-01 DIAGNOSIS — F4321 Adjustment disorder with depressed mood: Secondary | ICD-10-CM | POA: Diagnosis not present

## 2022-10-14 ENCOUNTER — Encounter: Payer: Self-pay | Admitting: Family Medicine

## 2022-10-14 ENCOUNTER — Ambulatory Visit (INDEPENDENT_AMBULATORY_CARE_PROVIDER_SITE_OTHER): Payer: Medicaid Other | Admitting: Family Medicine

## 2022-10-14 VITALS — BP 110/76 | HR 84 | Ht 74.0 in | Wt 241.0 lb

## 2022-10-14 DIAGNOSIS — I872 Venous insufficiency (chronic) (peripheral): Secondary | ICD-10-CM | POA: Diagnosis not present

## 2022-10-14 DIAGNOSIS — M722 Plantar fascial fibromatosis: Secondary | ICD-10-CM

## 2022-10-14 NOTE — Progress Notes (Signed)
Date:  10/14/2022   Name:  Luis Mcknight   DOB:  24-Jun-1980   MRN:  [id number]   Chief Complaint: Foot Pain (Bilateral foot pain- tried PT, exercises and Ibuprofen- effecting work- ankle swelling, heel to arch of foot)  Foot Pain This is a chronic problem. The current episode started more than 1 year ago. The problem has been unchanged. Associated symptoms include neck pain. Pertinent negatives include no abdominal pain, chest pain, chills, coughing, fever, headaches, myalgias, nausea, rash or sore throat. The symptoms are aggravated by standing and walking. He has tried NSAIDs (physical therapy/) for the symptoms. The treatment provided moderate relief.    Lab Results  Component Value Date   NA 142 05/30/2022   K 3.7 05/30/2022   CO2 24 05/30/2022   GLUCOSE 119 (H) 05/30/2022   BUN 13 05/30/2022   CREATININE 1.17 05/30/2022   CALCIUM 9.7 05/30/2022   EGFR 81 05/30/2022   GFRNONAA 99 04/03/2019   Lab Results  Component Value Date   CHOL 132 05/30/2022   HDL 33 (L) 05/30/2022   LDLCALC 55 05/30/2022   TRIG 283 (H) 05/30/2022   No results found for: "TSH" Lab Results  Component Value Date   HGBA1C 6.3 (H) 08/29/2022   Lab Results  Component Value Date   WBC 7.1 01/01/2019   HGB 14.7 01/01/2019   HCT 45.7 01/01/2019   MCV 80.3 01/01/2019   PLT 237 01/01/2019   Lab Results  Component Value Date   ALT 30 05/30/2022   AST 23 05/30/2022   ALKPHOS 122 (H) 05/30/2022   BILITOT 0.4 05/30/2022   No results found for: "25OHVITD2", "25OHVITD3", "VD25OH"   Review of Systems  Constitutional:  Negative for chills and fever.  HENT:  Negative for drooling, ear discharge, ear pain and sore throat.   Respiratory:  Negative for cough, chest tightness, shortness of breath and wheezing.   Cardiovascular:  Positive for palpitations and leg swelling. Negative for chest pain.  Gastrointestinal:  Negative for abdominal pain, blood in stool, constipation, diarrhea and nausea.   Endocrine: Negative for polydipsia.  Genitourinary:  Negative for dysuria, frequency, hematuria and urgency.  Musculoskeletal:  Positive for neck pain. Negative for back pain and myalgias.  Skin:  Negative for rash.  Allergic/Immunologic: Negative for environmental allergies.  Neurological:  Negative for dizziness and headaches.  Hematological:  Does not bruise/bleed easily.  Psychiatric/Behavioral:  Negative for suicidal ideas. The patient is not nervous/anxious.     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   Lumbar spondylosis 06/01/2020   Plantar fasciitis, 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   Chest pain of uncertain etiology 09/04/2019   OSA (obstructive sleep apnea) 06/04/2019   Current smoker 06/04/2019   Healthcare maintenance 06/04/2019   Acute pericarditis 04/03/2019   Sarcoidosis 10/16/2018   Lung nodules    Adenopathy    Essential hypertension 11/28/2017    Allergies  Allergen Reactions   Latex Itching and Rash    GLOVES   Pistachio Nut Extract Nausea Only    Past Surgical History:  Procedure Laterality Date   ELECTROMAGNETIC NAVIGATION BROCHOSCOPY Left 10/08/2018   Procedure: ELECTROMAGNETIC NAVIGATION BRONCHOSCOPY, LEFT;  Surgeon: Salena Saner,  MD;  Location: [facility] ORS;  Service: Cardiopulmonary;  Laterality: Left;   ENDOBRONCHIAL ULTRASOUND Left 10/08/2018   Procedure: ENDOBRONCHIAL ULTRASOUND, LEFT;  Surgeon: Salena Saner, MD;  Location: [facility] ORS;  Service: Cardiopulmonary;  Laterality: Left;   NO PAST SURGERIES      Social History   Tobacco Use   Smoking status: Every Day    Current  packs/day: 0.50    Average packs/day: 0.5 packs/day for 24.6 years (12.3 ttl pk-yrs)    Types: Cigarettes    Start date: 03/07/1998   Smokeless tobacco: Never   Tobacco comments:    0.5PPD 11/04/2021    0.25 PPD 03/04/2022  Vaping Use   Vaping status: Former   Start date: 04/18/2012   Quit date: 04/18/2016  Substance Use Topics   Alcohol use: Not Currently   Drug use: Not Currently     Medication list has been reviewed and updated.  No outpatient medications have been marked as taking for the 10/14/22 encounter (Office Visit) with Duanne Limerick, MD.       08/29/2022   10:08 AM 05/30/2022   10:49 AM 02/15/2022    9:55 AM 09/30/2021   10:29 AM  GAD 7 : Generalized Anxiety Score  Nervous, Anxious, on Edge 0 0 0 3  Control/stop worrying 0 0 0 3  Worry too much - different things 0 0 0 3  Trouble relaxing 0 0 0 3  Restless 0 0 0 3  Easily annoyed or irritable 0 0 0 1  Afraid - awful might happen 0 0 0 3  Total GAD 7 Score 0 0 0 19  Anxiety Difficulty Not difficult at all Not difficult at all Not difficult at all Very difficult       08/29/2022   10:07 AM 05/30/2022   10:49 AM 02/15/2022    9:55 AM  Depression screen PHQ 2/9  Decreased Interest 0 0 0  Down, Depressed, Hopeless 1 0 0  PHQ - 2 Score 1 0 0  Altered sleeping 0 0 0  Tired, decreased energy 0 0 0  Change in appetite 0 0 0  Feeling bad or failure about yourself  0 0 0  Trouble concentrating 0 0 0  Moving slowly or fidgety/restless 0 0 0  Suicidal thoughts 0 0 0  PHQ-9 Score 1 0 0  Difficult doing work/chores Not difficult at all Not difficult at all Not difficult at all    BP Readings from Last 3 Encounters:  10/14/22 110/76  08/29/22 120/78  05/30/22 128/70    Physical Exam Vitals and nursing note reviewed.  HENT:     Right Ear: Tympanic membrane normal.     Left Ear: Tympanic membrane normal.  Cardiovascular:     Rate and Rhythm: Normal rate and regular rhythm.     Heart sounds: No murmur heard.     No friction rub. No gallop.  Pulmonary:     Breath sounds: No wheezing, rhonchi or rales.  Musculoskeletal:       Feet:  Feet:     Comments: Bilateral plantar tenderness Neurological:     Mental Status: He is alert.     Wt Readings from Last 3 Encounters:  10/14/22 241 lb (109.3 kg)  08/29/22 243 lb (110.2 kg)  05/30/22 255 lb (115.7 kg)    BP 110/76   Pulse 84   Ht 6\' 2"  (1.88 m)   Wt 241 lb (109.3 kg)   SpO2 97%   BMI 30.94 kg/m  Assessment and Plan:  1. Bilateral plantar fasciitis Onset 2 years ago.  Patient has had on 2 occasions formal evaluation 1 by sports medicine and the other by podiatry without results.  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  The last podiatrist/Dr. Graciela Husbands suggested that if physical therapy and conservative treatment did not work that they may need to do injection of which he is hesitant due to a painful experience when it was injected prior 2 years ago.  I told him that it needs to have evaluation and perhaps procedure by podiatry for the best chance of resolution.  We will reestablish a podiatry connection preferably with Dr. Graciela Husbands who is seen before but any podiatrist to reevaluate with the possibility of injection.  2. Venous insufficiency Patient is reluctant to move much at work because of the pain therefore he stands most of the time and there is a gradual accumulation.  And this is been going on for 2 days.  We discussed venous insufficiency and that the lack of movement without pumping of the calf muscles probably results with gradual accumulation of fluid from the ankles down and that elevation at night would improve it however patient might need to invest and some compression leggings that would help prevent the accumulation of the fluid most likely of a venous insufficiency nature.   Elizabeth Sauer, MD

## 2022-10-15 DIAGNOSIS — F4321 Adjustment disorder with depressed mood: Secondary | ICD-10-CM | POA: Diagnosis not present

## 2022-10-27 DIAGNOSIS — M722 Plantar fascial fibromatosis: Secondary | ICD-10-CM | POA: Diagnosis not present

## 2022-11-04 ENCOUNTER — Encounter: Payer: Self-pay | Admitting: Emergency Medicine

## 2022-11-04 ENCOUNTER — Ambulatory Visit
Admission: EM | Admit: 2022-11-04 | Discharge: 2022-11-04 | Disposition: A | Discharge status: Home / Self Care | Payer: Medicaid Other | Attending: Internal Medicine | Admitting: Internal Medicine

## 2022-11-04 ENCOUNTER — Ambulatory Visit: Payer: Self-pay | Admitting: *Deleted

## 2022-11-04 ENCOUNTER — Ambulatory Visit: Payer: Medicaid Other | Admitting: Family Medicine

## 2022-11-04 DIAGNOSIS — Z8639 Personal history of other endocrine, nutritional and metabolic disease: Secondary | ICD-10-CM | POA: Diagnosis not present

## 2022-11-04 DIAGNOSIS — R002 Palpitations: Secondary | ICD-10-CM

## 2022-11-04 DIAGNOSIS — E876 Hypokalemia: Secondary | ICD-10-CM | POA: Diagnosis not present

## 2022-11-04 LAB — CBC WITH DIFFERENTIAL/PLATELET
Abs Immature Granulocytes: 0.02 10*3/uL (ref 0.00–0.07)
Basophils Absolute: 0 10*3/uL (ref 0.0–0.1)
Basophils Relative: 0 %
Eosinophils Absolute: 0.1 10*3/uL (ref 0.0–0.5)
Eosinophils Relative: 1 %
HCT: 42.2 % (ref 39.0–52.0)
Hemoglobin: 14.2 g/dL (ref 13.0–17.0)
Immature Granulocytes: 0 %
Lymphocytes Relative: 26 %
Lymphs Abs: 2.2 10*3/uL (ref 0.7–4.0)
MCH: 27.2 pg (ref 26.0–34.0)
MCHC: 33.6 g/dL (ref 30.0–36.0)
MCV: 80.8 fL (ref 80.0–100.0)
Monocytes Absolute: 0.6 10*3/uL (ref 0.1–1.0)
Monocytes Relative: 8 %
Neutro Abs: 5.4 10*3/uL (ref 1.7–7.7)
Neutrophils Relative %: 65 %
Platelets: 275 10*3/uL (ref 150–400)
RBC: 5.22 MIL/uL (ref 4.22–5.81)
RDW: 14.5 % (ref 11.5–15.5)
WBC: 8.4 10*3/uL (ref 4.0–10.5)
nRBC: 0 % (ref 0.0–0.2)

## 2022-11-04 LAB — COMPREHENSIVE METABOLIC PANEL
ALT: 30 U/L (ref 0–44)
AST: 31 U/L (ref 15–41)
Albumin: 4.5 g/dL (ref 3.5–5.0)
Alkaline Phosphatase: 65 U/L (ref 38–126)
Anion gap: 9 (ref 5–15)
BUN: 27 mg/dL — ABNORMAL HIGH (ref 6–20)
CO2: 26 mmol/L (ref 22–32)
Calcium: 9 mg/dL (ref 8.9–10.3)
Chloride: 100 mmol/L (ref 98–111)
Creatinine, Ser: 1.11 mg/dL (ref 0.61–1.24)
GFR, Estimated: >60 mL/min (ref >60)
Glucose, Bld: 109 mg/dL — ABNORMAL HIGH (ref 70–99)
Potassium: 3.4 mmol/L — ABNORMAL LOW (ref 3.5–5.1)
Sodium: 135 mmol/L (ref 135–145)
Total Bilirubin: 0.9 mg/dL (ref 0.3–1.2)
Total Protein: 8 g/dL (ref 6.5–8.1)

## 2022-11-04 LAB — MAGNESIUM: Magnesium: 2.3 mg/dL (ref 1.7–2.4)

## 2022-11-04 MED ORDER — POTASSIUM CHLORIDE ER 10 MEQ PO TBCR: 10.0000 meq | EXTENDED_RELEASE_TABLET | Freq: Every day | ORAL | 0 refills | Status: DC

## 2022-11-04 NOTE — ED Provider Notes (Signed)
[facility]    CSN: [id number] Arrival date & time: 11/04/22  1010      History   Chief Complaint Chief Complaint  Patient presents with   Palpitations    HPI Luis Mcknight is a 42 y.o. male who presents with noticing heart palpitations since he had injections of steroid in both heels 8 days ago. And the next day he developed intermittent episodes of feeling his heart is skipping for a minute or less and goes back to normal. During those episodes he feels he can't breath as well, but denies sweats or chest pains. He denies drinking energy drinks or taking sinus medication.  Denies N/V/D, fever URI symptoms. Has lost 15 lbs in the past month since in new job which is more physical and has cut out sweets since he is prediabetic Has been mildly constipated.His last labs have been in June and was normal then.     Past Medical History:  Diagnosis Date   Anxiety    Arthritis    Depression    NO MEDS   GERD (gastroesophageal reflux disease)    NO MEDS   Hypertension    OSA on CPAP    Pneumonia    JUNE 2020   Sleep apnea     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   Lumbar spondylosis 06/01/2020   Plantar fasciitis, 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   Chest pain of uncertain etiology 09/04/2019   OSA (obstructive sleep apnea) 06/04/2019   Current smoker 06/04/2019   Healthcare maintenance 06/04/2019   Acute pericarditis 04/03/2019   Sarcoidosis 10/16/2018   Lung nodules    Adenopathy    Essential hypertension 11/28/2017    Past Surgical History:  Procedure Laterality Date   ELECTROMAGNETIC  NAVIGATION BROCHOSCOPY Left 10/08/2018   Procedure: ELECTROMAGNETIC NAVIGATION BRONCHOSCOPY, LEFT;  Surgeon: Salena Saner, MD;  Location: [facility];  Service: Cardiopulmonary;  Laterality: Left;   ENDOBRONCHIAL ULTRASOUND Left 10/08/2018   Procedure: ENDOBRONCHIAL ULTRASOUND, LEFT;  Surgeon: Salena Saner, MD;  Location: [facility];  Service: Cardiopulmonary;  Laterality: Left;   NO PAST SURGERIES         Home Medications    Prior to Admission medications   Medication Sig Start Date End Date Taking? Authorizing Provider  amLODipine (NORVASC) 10 MG tablet TAKE 1 TABLET(10 MG) BY MOUTH EVERY MORNING 02/15/22  Yes Duanne Limerick, MD  ARIPiprazole (ABILIFY) 2 MG tablet Take 2 mg by mouth daily. psych 02/07/22  Yes [provider]  atorvastatin (LIPITOR) 10 MG tablet TAKE 1 TABLET(10 MG) BY MOUTH DAILY 05/30/22  Yes Duanne Limerick, MD  metFORMIN (GLUCOPHAGE) 500 MG tablet Take 1 tablet (500 mg total) by mouth daily. 08/29/22  Yes Duanne Limerick, MD  metoprolol succinate (TOPROL XL) 25 MG 24 hr tablet Take 1 tablet (25 mg total) by mouth daily. 02/09/22  Yes End, Cristal Deer, MD  Multiple Vitamin (MULTI-VITAMIN) tablet Take 1 tablet by mouth daily.   Yes [provider]  pantoprazole (PROTONIX) 20 MG tablet TAKE 1 TABLET(20 MG) BY MOUTH DAILY 08/22/22  Yes Duanne Limerick, MD  potassium chloride (KLOR-CON) 10 MEQ tablet Take 1 tablet (10 mEq total)  by mouth daily. 11/04/22  Yes Rodriguez-Southworth, Nettie Elm, PA-C  Accu-Chek Softclix Lancets lancets USE ONCE DAILY 08/05/22   Duanne Limerick, MD  blood glucose meter kit and supplies Dispense based on patient and insurance preference. Use up to four times daily as directed. (FOR ICD-10 E10.9, E11.9). 02/17/22   Duanne Limerick, MD  glucose blood (ACCU-CHEK GUIDE) test strip USE ONCE DAILY 07/22/22   Duanne Limerick, MD  UNABLE TO FIND CPAP AT NIGHT    [provider]    Family History Family History  Problem Relation Age  of Onset   Hypertension Mother    Diabetes Mother    Diabetes Father    Hypertension Father    Heart attack Father 15   Lung cancer Maternal Grandmother     Social History Social History   Tobacco Use   Smoking status: Every Day    Current packs/day: 0.50    Average packs/day: 0.5 packs/day for 24.7 years (12.3 ttl pk-yrs)    Types: Cigarettes    Start date: 03/07/1998   Smokeless tobacco: Never   Tobacco comments:    0.5PPD 11/04/2021    0.25 PPD 03/04/2022  Vaping Use   Vaping status: Former   Start date: 04/18/2012   Quit date: 04/18/2016  Substance Use Topics   Alcohol use: Not Currently   Drug use: Not Currently     Allergies   Latex and Pistachio nut extract   Review of Systems Review of Systems As noted in HPI  Physical Exam Triage Vital Signs ED Triage Vitals  Encounter Vitals Group     BP 11/04/22 1033 124/84     Systolic BP Percentile --      Diastolic BP Percentile --      Pulse Rate 11/04/22 1033 76     Resp 11/04/22 1033 15     Temp 11/04/22 1033 98.4 F (36.9 C)     Temp Source 11/04/22 1033 Oral     SpO2 11/04/22 1033 99 %     Weight 11/04/22 1031 240 lb 15.4 oz (109.3 kg)     Height 11/04/22 1031 6\' 2"  (1.88 m)     Head Circumference --      Peak Flow --      Pain Score 11/04/22 1030 0     Pain Loc --      Pain Education --      Exclude from Growth Chart --    No data found.  Updated Vital Signs BP 124/84 (BP Location: Left Arm)   Pulse 76   Temp 98.4 F (36.9 C) (Oral)   Resp 15   Ht 6\' 2"  (1.88 m)   Wt 240 lb 15.4 oz (109.3 kg)   SpO2 99%   BMI 30.94 kg/m   Visual Acuity Right Eye Distance:   Left Eye Distance:   Bilateral Distance:    Right Eye Near:   Left Eye Near:    Bilateral Near:     Physical Exam Vitals and nursing note reviewed.  Constitutional:      General: He is not in acute distress.    Appearance: He is normal weight. He is not toxic-appearing.  HENT:     Right Ear: External ear normal.     Left  Ear: External ear normal.     Nose: Nose normal.  Eyes:     General: No scleral icterus.    Conjunctiva/sclera: Conjunctivae normal.  Cardiovascular:     Rate and Rhythm: Normal rate.  Heart sounds: No murmur heard.    Comments: Had 30 seconds of skip beats every 4-6th beats, then went back to normal. And he could feel it every time. But during those episodes he did not have chest pains, was breathing normal and did not get diaphoretic Pulmonary:     Effort: Pulmonary effort is normal.     Breath sounds: Normal breath sounds.  Musculoskeletal:        General: Normal range of motion.     Cervical back: Neck supple.  Skin:    General: Skin is warm and dry.  Neurological:     Mental Status: He is alert and oriented to person, place, and time.     Gait: Gait normal.  Psychiatric:        Mood and Affect: Mood normal.        Behavior: Behavior normal.        Thought Content: Thought content normal.        Judgment: Judgment normal.      UC Treatments / Results  Labs (all labs ordered are listed, but only abnormal results are displayed) Labs Reviewed  COMPREHENSIVE METABOLIC PANEL - Abnormal; Notable for the following components:      Result Value   Potassium 3.4 (*)    Glucose, Bld 109 (*)    BUN 27 (*)    All other components within normal limits  MAGNESIUM  CBC WITH DIFFERENTIAL/PLATELET  THYROID PANEL WITH TSH    EKG NSR Normal  Radiology No results found.  Procedures Procedures (including critical care time)  Medications Ordered in UC Medications - No data to display  Initial Impression / Assessment and Plan / UC Course  I have reviewed the triage vital signs and the nursing notes.  Pertinent labs & imaging results that were available during my care of the patient were reviewed by me and considered in my medical decision making (see chart for details).  Hypokalemia Possible PVC's  I placed him on potasium 10 meq every day and needs to have his  potasium rechecked next month. See instructions.    Final Clinical Impressions(s) / UC Diagnoses   Final diagnoses:  Palpitations  History of hypokalemia     Discharge Instructions      Star the potasium today and follow up with your primary care doctor next month If you develop prolong episodes of skip beats with sweats and chest pain, go to the ER right away     ED Prescriptions     Medication Sig Dispense Auth. Provider   potassium chloride (KLOR-CON) 10 MEQ tablet Take 1 tablet (10 mEq total) by mouth daily. 30 tablet Rodriguez-Southworth, Nettie Elm, PA-C      PDMP not reviewed this encounter.   Garey Ham, [place] 11/04/22 1242

## 2022-11-04 NOTE — Discharge Instructions (Signed)
Star the potasium today and follow up with your primary care doctor next month If you develop prolong episodes of skip beats with sweats and chest pain, go to the ER right away

## 2022-11-04 NOTE — Telephone Encounter (Signed)
  Chief Complaint: skipped heartbeat through out day mostly at rest. Shortness of breath  Symptoms: feels like skipped heart beat after getting injections in heels on 10/27/22 started sx 10/28/22. "Feels like heart jumps". Happens through out the day lasting a few seconds esp at rest but goes away with working. Does report SOB with skipped heart beats. Sweating at times but reports has happened prior to sx. Hx sarcodosis.  Frequency: 10/28/22 Pertinent Negatives: Patient denies chest pain no difficulty breathing. No dizziness, no N/V Disposition: [] ED /[x] Urgent Care (no appt availability in office) / [] Appointment(In office/virtual)/ []  [facility]/ [] Home Care/ [] Refused Recommended Disposition /[] [facility]/ []  Follow-up with PCP Additional Notes:   Recommended to be seen within 4 hours and patient reports he will go to UC. Appt available please advise if patient should come to office.       Reason for Disposition  [1] Skipped or extra beat(s) AND [2] occurs 4 or more times per minute  Answer Assessment - Initial Assessment Questions 1. DESCRIPTION: "Please describe your heart rate or heartbeat that you are having" (e.g., fast/slow, regular/irregular, skipped or extra beats, "palpitations")     Skipped beats "like jumps" since receiving injection in heels 2. ONSET: "When did it start?" (Minutes, hours or days)      10/27/22 last OV podiatry and sx next day started  3. DURATION: "How long does it last" (e.g., seconds, minutes, hours)     A few seconds all day  4. PATTERN "Does it come and go, or has it been constant since it started?"  "Does it get worse with exertion?"   "Are you feeling it now?"     Comes and goes but worse at rest  5. TAP: "Using your hand, can you tap out what you are feeling on a chair or table in front of you, so that I can hear?" (Note: not all patients can do this)       na 6. HEART RATE: "Can you tell me your heart rate?" "How many beats in  15 seconds?"  (Note: not all patients can do this)       no 7. RECURRENT SYMPTOM: "Have you ever had this before?" If Yes, ask: "When was the last time?" and "What happened that time?"      Na  8. CAUSE: "What do you think is causing the palpitations?"     Not sure  9. CARDIAC HISTORY: "Do you have any history of heart disease?" (e.g., heart attack, angina, bypass surgery, angioplasty, arrhythmia)      Hx sarcodosis  10. OTHER SYMPTOMS: "Do you have any other symptoms?" (e.g., dizziness, chest pain, sweating, difficulty breathing)       Shortness of breath when heart skips beats at rest, feels better when working does not feel skipped beats 11. PREGNANCY: "Is there any chance you are pregnant?" "When was your last menstrual period?"       na  Protocols used: Heart Rate and Heartbeat Questions-A-AH

## 2022-11-04 NOTE — ED Triage Notes (Signed)
Patient states that he has been having heart palpitation ever since he had injections in both heels of his feet.  Patient reports heart palpitations that are constant for a week.  Patient reports SOB.  Patient denies any chest pain.

## 2022-11-05 LAB — THYROID PANEL WITH TSH
Free Thyroxine Index: 2 (ref 1.2–4.9)
T3 Uptake Ratio: 25 % (ref 24–39)
T4, Total: 7.8 ug/dL (ref 4.5–12.0)
TSH: 0.649 u[IU]/mL (ref 0.450–4.500)

## 2022-11-22 ENCOUNTER — Ambulatory Visit
Admission: EM | Admit: 2022-11-22 | Discharge: 2022-11-22 | Disposition: A | Discharge status: Home / Self Care | Payer: Medicaid Other | Attending: Emergency Medicine | Admitting: Emergency Medicine

## 2022-11-22 DIAGNOSIS — B349 Viral infection, unspecified: Secondary | ICD-10-CM | POA: Diagnosis not present

## 2022-11-22 LAB — GROUP A STREP BY PCR: Group A Strep by PCR: NOT DETECTED

## 2022-11-22 NOTE — Discharge Instructions (Addendum)
Your strep test is negative. Most likely you have a viral illness: no antibiotic as indicated at this time, May treat with OTC meds of choice. Make sure to drink plenty of fluids to stay hydrated(gatorade, water, popsicles,jello,etc), avoid caffeine products. Follow up with PCP. Return as needed.  May take Coricidin HBP for symptom management

## 2022-11-22 NOTE — ED Provider Notes (Signed)
[facility]    CSN: [id number] Arrival date & time: 11/22/22  1324      History   Chief Complaint Chief Complaint  Patient presents with   Sore Throat   Headache   Abdominal Pain   Fatigue    HPI Luis Mcknight is a 42 y.o. male.   42 year old male pt, Luis Mcknight, presents to urgent care for evaluation of abdominal pain, fever, HA,fatigue, sore throat x 3 days. Took tylenol for symptom management.  The history is provided by the patient. No language interpreter was used.    Past Medical History:  Diagnosis Date   Anxiety    Arthritis    Depression    NO MEDS   GERD (gastroesophageal reflux disease)    NO MEDS   Hypertension    OSA on CPAP    Pneumonia    JUNE 2020   Sleep apnea     Patient Active Problem List   Diagnosis Date Noted   Nonspecific syndrome suggestive of viral illness 11/22/2022   Palpitations 05/12/2021   Psychophysiological insomnia 11/26/2020   History of pericarditis 11/06/2020   Metatarsalgia, right foot 08/28/2020   Adjustment reaction with anxiety and depression 08/28/2020   Fibromyalgia 08/28/2020   Plantar fasciitis, right 07/14/2020   Cervical spondylosis with radiculopathy 06/01/2020   Supraspinatus syndrome, 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   Lumbar spondylosis 06/01/2020   Plantar fasciitis, 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   Chest pain of uncertain etiology 09/04/2019   OSA (obstructive sleep apnea) 06/04/2019   Current smoker 06/04/2019   Healthcare maintenance 06/04/2019   Acute pericarditis 04/03/2019   Sarcoidosis 10/16/2018   Lung nodules    Adenopathy    Essential hypertension 11/28/2017    Past Surgical History:  Procedure Laterality Date   ELECTROMAGNETIC NAVIGATION BROCHOSCOPY Left 10/08/2018   Procedure: ELECTROMAGNETIC NAVIGATION BRONCHOSCOPY, LEFT;  Surgeon: Salena Saner, MD;  Location: [facility];  Service: Cardiopulmonary;  Laterality: Left;   ENDOBRONCHIAL ULTRASOUND Left 10/08/2018   Procedure: ENDOBRONCHIAL ULTRASOUND,  LEFT;  Surgeon: Salena Saner, MD;  Location: [facility];  Service: Cardiopulmonary;  Laterality: Left;   NO PAST SURGERIES         Home Medications    Prior to Admission medications   Medication Sig Start Date End Date Taking? Authorizing Provider  Accu-Chek Softclix Lancets lancets USE ONCE DAILY 08/05/22   Duanne Limerick, MD  amLODipine (NORVASC) 10 MG tablet TAKE 1 TABLET(10 MG) BY MOUTH EVERY MORNING 02/15/22   Duanne Limerick, MD  ARIPiprazole (ABILIFY) 2 MG tablet Take 2 mg by mouth daily. psych 02/07/22   [provider]  atorvastatin (LIPITOR) 10 MG tablet TAKE 1 TABLET(10 MG) BY MOUTH DAILY 05/30/22   Duanne Limerick, MD  blood glucose meter kit and supplies Dispense based on patient and insurance preference. Use up to four times daily as directed. (FOR ICD-10 E10.9, E11.9). 02/17/22   Duanne Limerick, MD  glucose blood (ACCU-CHEK GUIDE) test strip USE ONCE DAILY 07/22/22   Duanne Limerick, MD  metFORMIN (GLUCOPHAGE) 500 MG tablet Take 1 tablet (500 mg total) by mouth daily. 08/29/22   Duanne Limerick, MD  metoprolol succinate (TOPROL XL) 25 MG 24 hr tablet Take 1 tablet (25 mg total) by mouth daily. 02/09/22   End, Cristal Deer, MD  Multiple Vitamin (MULTI-VITAMIN) tablet Take 1 tablet by mouth daily.    [provider]  pantoprazole (PROTONIX) 20 MG tablet TAKE 1 TABLET(20 MG) BY MOUTH DAILY 08/22/22   Elizabeth Sauer  C, MD  potassium chloride (KLOR-CON) 10 MEQ tablet Take 1 tablet (10 mEq total) by mouth daily. 11/04/22   Rodriguez-Southworth, Nettie Elm, PA-C  UNABLE TO FIND CPAP AT NIGHT    [provider]    Family History Family History  Problem Relation Age of Onset   Hypertension Mother    Diabetes Mother    Diabetes Father    Hypertension Father    Heart attack Father 18   Lung cancer Maternal Grandmother     Social History Social History   Tobacco Use   Smoking status: Every Day    Current packs/day: 0.50    Average packs/day: 0.5  packs/day for 24.7 years (12.4 ttl pk-yrs)    Types: Cigarettes    Start date: 03/07/1998   Smokeless tobacco: Never   Tobacco comments:    0.5PPD 11/04/2021    0.25 PPD 03/04/2022  Vaping Use   Vaping status: Former   Start date: 04/18/2012   Quit date: 04/18/2016  Substance Use Topics   Alcohol use: Not Currently   Drug use: Not Currently     Allergies   Latex and Pistachio nut extract   Review of Systems Review of Systems  Constitutional:  Positive for fatigue.  HENT:  Positive for sore throat.   Gastrointestinal:  Positive for abdominal pain.  Neurological:  Positive for headaches.  All other systems reviewed and are negative.    Physical Exam Triage Vital Signs ED Triage Vitals  Encounter Vitals Group     BP 11/22/22 0839 120/77     Systolic BP Percentile --      Diastolic BP Percentile --      Pulse Rate 11/22/22 0839 70     Resp 11/22/22 0839 16     Temp 11/22/22 0839 98.7 F (37.1 C)     Temp Source 11/22/22 0839 Oral     SpO2 11/22/22 0839 97 %     Weight --      Height --      Head Circumference --      Peak Flow --      Pain Score 11/22/22 0840 3     Pain Loc --      Pain Education --      Exclude from Growth Chart --    No data found.  Updated Vital Signs BP 120/77 (BP Location: Left Arm)   Pulse 70   Temp 98.7 F (37.1 C) (Oral)   Resp 16   SpO2 97%   Visual Acuity Right Eye Distance:   Left Eye Distance:   Bilateral Distance:    Right Eye Near:   Left Eye Near:    Bilateral Near:     Physical Exam Vitals and nursing note reviewed.  Constitutional:      General: He is active. He is not in acute distress.    Appearance: He is well-developed. He is not ill-appearing or toxic-appearing.  HENT:     Head: Normocephalic.     Right Ear: Tympanic membrane is retracted.     Left Ear: Tympanic membrane is retracted.     Nose: Mucosal edema present.     Mouth/Throat:     Mouth: Oropharynx is clear and moist and mucous membranes are  normal.     Pharynx: Uvula midline.  Eyes:     General: Lids are normal.     Extraocular Movements: EOM normal.     Conjunctiva/sclera: Conjunctivae normal.     Pupils: Pupils are equal, round, and reactive  to light.  Cardiovascular:     Rate and Rhythm: Normal rate and regular rhythm.     Heart sounds: Normal heart sounds.  Pulmonary:     Effort: Pulmonary effort is normal. No respiratory distress.     Breath sounds: Normal breath sounds. No decreased breath sounds or wheezing.  Abdominal:     General: There is no distension.     Palpations: Abdomen is soft.  Musculoskeletal:        General: Normal range of motion.     Cervical back: Normal range of motion.  Skin:    General: Skin is warm, dry and intact.     Findings: No rash.  Neurological:     General: No focal deficit present.     Mental Status: He is alert and oriented to person, place, and time.     GCS: GCS eye subscore is 4. GCS verbal subscore is 5. GCS motor subscore is 6.     Cranial Nerves: No cranial nerve deficit.     Sensory: No sensory deficit.  Psychiatric:        Attention and Perception: Attention normal.        Mood and Affect: Mood and affect and mood normal.        Speech: Speech normal.        Behavior: Behavior normal. Behavior is cooperative.      UC Treatments / Results  Labs (all labs ordered are listed, but only abnormal results are displayed) Labs Reviewed  GROUP A STREP BY PCR    EKG   Radiology No results found.  Procedures Procedures (including critical care time)  Medications Ordered in UC Medications - No data to display  Initial Impression / Assessment and Plan / UC Course  I have reviewed the triage vital signs and the nursing notes.  Pertinent labs & imaging results that were available during my care of the patient were reviewed by me and considered in my medical decision making (see chart for details).  Clinical Course as of 11/22/22 1206  Tue Nov 22, 2022  8119  Strep pending [JD]    Clinical Course User Index [JD] Ada Woodbury, Para March, NP  Discussed exam findings and plan of care with patient, strict go to ER precautions given.   Patient verbalized understanding to this provider.  Ddx: Viral illness, allergies Final Clinical Impressions(s) / UC Diagnoses   Final diagnoses:  Nonspecific syndrome suggestive of viral illness     Discharge Instructions      Your strep test is negative. Most likely you have a viral illness: no antibiotic as indicated at this time, May treat with OTC meds of choice. Make sure to drink plenty of fluids to stay hydrated(gatorade, water, popsicles,jello,etc), avoid caffeine products. Follow up with PCP. Return as needed.  May take Coricidin HBP for symptom management     ED Prescriptions   None    PDMP not reviewed this encounter.   Clancy Gourd, NP 11/22/22 [phone]

## 2022-11-22 NOTE — ED Triage Notes (Signed)
Patient presents to UC for abdominal pain, low grade fever (100.), HA, fatigue, and sore throat since 3 days ago. Treating symptoms with tylenol.

## 2022-11-27 ENCOUNTER — Other Ambulatory Visit: Payer: Self-pay | Admitting: Family Medicine

## 2022-11-27 DIAGNOSIS — I10 Essential (primary) hypertension: Secondary | ICD-10-CM

## 2022-11-30 ENCOUNTER — Other Ambulatory Visit: Payer: Self-pay

## 2022-11-30 ENCOUNTER — Encounter: Payer: Self-pay | Admitting: Nurse Practitioner

## 2022-11-30 ENCOUNTER — Telehealth: Payer: Medicaid Other | Admitting: Nurse Practitioner

## 2022-11-30 DIAGNOSIS — G4733 Obstructive sleep apnea (adult) (pediatric): Secondary | ICD-10-CM

## 2022-11-30 DIAGNOSIS — R002 Palpitations: Secondary | ICD-10-CM | POA: Diagnosis not present

## 2022-11-30 DIAGNOSIS — R058 Other specified cough: Secondary | ICD-10-CM | POA: Insufficient documentation

## 2022-11-30 DIAGNOSIS — D869 Sarcoidosis, unspecified: Secondary | ICD-10-CM | POA: Diagnosis not present

## 2022-11-30 DIAGNOSIS — J069 Acute upper respiratory infection, unspecified: Secondary | ICD-10-CM | POA: Diagnosis not present

## 2022-11-30 MED ORDER — PREDNISONE 20 MG PO TABS
40.0000 mg | ORAL_TABLET | Freq: Every day | ORAL | 0 refills | Status: AC
Start: 2022-11-30 — End: 2022-12-05

## 2022-11-30 MED ORDER — ALBUTEROL SULFATE HFA 108 (90 BASE) MCG/ACT IN AERS
2.0000 | INHALATION_SPRAY | Freq: Four times a day (QID) | RESPIRATORY_TRACT | 2 refills | Status: AC | PRN
Start: 2022-11-30 — End: ?

## 2022-11-30 MED ORDER — FLUTICASONE PROPIONATE 50 MCG/ACT NA SUSP: 2.0000 | Freq: Every day | NASAL | 2 refills | Status: AC

## 2022-11-30 NOTE — Progress Notes (Deleted)
@Patient  ID: Luis Mcknight, male    DOB: 1980-12-05, 42 y.o.   MRN: [id number]  No chief complaint on file.   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  Past Medical History:  Diagnosis Date   Anxiety    Arthritis    Depression    NO MEDS   GERD (gastroesophageal reflux disease)    NO MEDS   Hypertension    OSA on CPAP    Pneumonia    JUNE 2020   Sleep apnea     Tobacco History: Social History   Tobacco Use  Smoking Status Every Day   Current packs/day: 0.50   Average packs/day: 0.5 packs/day for 24.7 years (12.4 ttl pk-yrs)   Types: Cigarettes   Start date: 03/07/1998  Smokeless Tobacco Never  Tobacco Comments   0.5PPD 11/04/2021   0.25 PPD 03/04/2022   Ready to quit: Not Answered Counseling given: Not Answered Tobacco comments: 0.5PPD 11/04/2021 0.25 PPD 03/04/2022   Outpatient Medications Prior to Visit  Medication Sig Dispense Refill   Accu-Chek Softclix Lancets lancets USE ONCE DAILY 100 each 3   amLODipine (NORVASC) 10 MG tablet TAKE 1 TABLET(10 MG) BY MOUTH EVERY MORNING 90 tablet 1   ARIPiprazole (ABILIFY) 2 MG tablet Take 2 mg by mouth daily. psych     atorvastatin (LIPITOR) 10 MG tablet TAKE 1 TABLET(10 MG) BY MOUTH DAILY 90 tablet 1   blood glucose meter kit and supplies Dispense based on patient and insurance preference. Use up to four times daily as directed. (FOR ICD-10 E10.9, E11.9). 1 each 0   glucose blood (ACCU-CHEK GUIDE) test strip USE ONCE DAILY 100 strip 2   metFORMIN (GLUCOPHAGE) 500  MG tablet Take 1 tablet (500 mg total) by mouth daily. 90 tablet 1   metoprolol succinate (TOPROL XL) 25 MG 24 hr tablet Take 1 tablet (25 mg total) by mouth daily. 90 tablet 3   Multiple Vitamin (MULTI-VITAMIN) tablet Take 1 tablet by mouth daily.     pantoprazole (PROTONIX) 20 MG tablet TAKE 1 TABLET(20 MG) BY MOUTH DAILY 90 tablet 0   potassium chloride (KLOR-CON) 10 MEQ tablet Take 1 tablet (10 mEq total) by mouth daily. 30 tablet 0   UNABLE TO FIND CPAP AT NIGHT     No facility-administered medications prior to visit.     Review of Systems:   Constitutional: No weight loss or gain, night sweats, fevers, chills, fatigue, or lassitude. HEENT: No headaches, difficulty swallowing, tooth/dental problems, or sore throat. No sneezing, itching, ear ache, nasal congestion, or post nasal drip CV:  No chest pain, orthopnea, PND, swelling in lower extremities, anasarca, dizziness, palpitations, syncope Resp: No shortness of breath with exertion or at rest. No excess mucus or change in color of mucus. No productive or non-productive. No hemoptysis. No wheezing.  No chest wall deformity GI:  No heartburn, indigestion, abdominal pain, nausea, vomiting, diarrhea, change in bowel habits, loss of appetite, bloody stools.  GU:  No dysuria, change in color of urine, urgency or frequency.  No flank pain, no hematuria  Skin: No rash, lesions, ulcerations MSK:  No joint pain or swelling.  No decreased range of motion.  No back pain. Neuro: No dizziness or lightheadedness.  Psych: No depression or anxiety. Mood stable.     Physical Exam:  There were no vitals taken for this visit.  GEN: Pleasant, interactive, well-nourished/chronically-ill appearing/acutely-ill appearing/poorly-nourished/morbidly obese; in no acute distress.****** HEENT:  Normocephalic and atraumatic. EACs patent bilaterally. TM pearly gray with present light reflex bilaterally. PERRLA. Sclera white. Nasal turbinates pink, moist and  patent bilaterally. No rhinorrhea present. Oropharynx pink and moist, without exudate or edema. No lesions, ulcerations, or postnasal drip.  NECK:  Supple w/ fair ROM. No JVD present. Normal carotid impulses w/o bruits. Thyroid symmetrical with no goiter or nodules palpated. No lymphadenopathy.   CV: RRR, no m/r/g, no peripheral edema. Pulses intact, +2 bilaterally. No cyanosis, pallor or clubbing. PULMONARY:  Unlabored, regular breathing. Clear bilaterally A&P w/o wheezes/rales/rhonchi. No accessory muscle use.  GI: BS present and normoactive. Soft, non-tender to palpation. No organomegaly or masses detected. No CVA tenderness. MSK: No erythema, warmth or tenderness. Cap refil <2 sec all extrem. No deformities or joint swelling noted.  Neuro: A/Ox3. No focal deficits noted.   Skin: Warm, no lesions or rashe Psych: Normal affect and behavior. Judgement and thought content appropriate.     Lab Results:  CBC    Component Value Date/Time   WBC 8.4 11/04/2022 1100   RBC 5.22 11/04/2022 1100   HGB 14.2 11/04/2022 1100   HCT 42.2 11/04/2022 1100   PLT 275 11/04/2022 1100   MCV 80.8 11/04/2022 1100   MCH 27.2 11/04/2022 1100   MCHC 33.6 11/04/2022 1100   RDW 14.5 11/04/2022 1100   LYMPHSABS 2.2 11/04/2022 1100   MONOABS 0.6 11/04/2022 1100   EOSABS 0.1 11/04/2022 1100   BASOSABS 0.0 11/04/2022 1100    BMET    Component Value Date/Time   NA 135 11/04/2022 1100   NA 142 05/30/2022 1138   K 3.4 (L) 11/04/2022 1100   CL 100 11/04/2022 1100   CO2 26 11/04/2022 1100   GLUCOSE 109 (H) 11/04/2022 1100   BUN 27 (H) 11/04/2022 1100   BUN 13 05/30/2022 1138   CREATININE 1.11 11/04/2022 1100   CALCIUM 9.0 11/04/2022 1100   GFRNONAA >60 11/04/2022 1100   GFRAA 114 04/03/2019 1032    BNP    Component Value Date/Time   BNP 4.0 12/21/2017 2201     Imaging:  No results found.  Administration History     None          Latest Ref Rng & Units 02/17/2022    4:56 PM  PFT  Results  FVC-Pre L 4.38   FVC-Predicted Pre % 74   FVC-Post L 4.45   FVC-Predicted Post % 75   Pre FEV1/FVC % % 84   Post FEV1/FCV % % 79   FEV1-Pre L 3.68   FEV1-Predicted Pre % 78   FEV1-Post L 3.49   DLCO uncorrected ml/min/mmHg 20.57   DLCO UNC% % 59   DLVA Predicted % 89   TLC L 5.45   TLC % Predicted % 72   RV % Predicted % 54     No results found for: "NITRICOXIDE"      Assessment & Plan:   No problem-specific Assessment & Plan notes found for this encounter.   I spent *** minutes of dedicated  to the care of this patient on the date of this encounter to include pre-visit review of records, face-to-face time with the patient discussing conditions above, post visit ordering of testing, clinical documentation with the electronic health record, making appropriate referrals as documented, and communicating necessary findings to members of the patients care team.  Noemi Chapel, NP 11/30/2022  Pt aware and understands NP's role.

## 2022-11-30 NOTE — Assessment & Plan Note (Signed)
See above

## 2022-11-30 NOTE — Progress Notes (Signed)
Agree with the details of the visit as noted by Micheline Maze, NP.  Patient should set up appointment with cardiology.  Sarcoidosis may also affect the heart and it is imperative that he get cardiac evaluation.  Gailen Shelter, MD Advanced Bronchoscopy PCCM [facility]

## 2022-11-30 NOTE — Assessment & Plan Note (Signed)
See above. Slight decline in previous FEV1. Will have him repeat PFTs at follow up and likely repeat CXR.

## 2022-11-30 NOTE — Progress Notes (Signed)
Patient ID: Luis Mcknight, male     DOB: October 20, 1980, 42 y.o.      MRN: [id number]  Chief Complaint  Patient presents with   Follow-up    Virtual Visit via Video Note  I connected with Luis Mcknight on 11/30/22 at  9:00 AM EDT by a video enabled telemedicine application and verified that I am speaking with the correct person using two identifiers.  Location: Patient: [name] Provider: Office   I discussed the limitations of evaluation and management by telemedicine and the availability of in person appointments. The patient expressed understanding and agreed to proceed.  History of Present Illness: 42 year old male, active smoker followed for sarcoidosis, OSA on CPAP.  He is a patient of Dr. Jayme Cloud and last seen in office 03/04/2022.  Past medical history significant for hypertension, acute pericarditis, fibromyalgia, insomnia, palpitations.  TESTS/EVENTS: 2020 PSG: AHI 34.3/h 01/03/2020 CXR: clear lungs 02/17/2022 PFT: FVC 74, FEV1 78, ratio 79, TLC 72, DLCOcor 67. No BD  02/23/2022: OV with Dr. Jayme Cloud.  Previous PFTs showed a modest decrease in FEV1.  No fevers, chills or sweats.  No joint pains or myalgias.  Has some baseline shortness of breath with heavy exertion.  Unfortunately continues to smoke as well.  Has a productive a.m. cough.  Has had some left flank and back pain that is being evaluated by his PCP.  No pleuritic component.  Appears to be musculoskeletal.  Using CPAP nightly.  Needs new CPAP mask.  Download shows good compliance and control.  Counseled on smoking cessation.  Sarcoid appears to be quiescent.   11/30/2022: Today-overdue follow-up Patient presents today for intended 67-month follow-up; however, also having some acute symptoms.  He started having issues with palpitations the end of August.  He went to urgent care on 8/30.  He reported having issues with shortness of breath during these episodes as well.  Had also reported 15 pound weight loss since  starting a new job which is more physical and causes more sweating.  He had also cut out sweets since he is prediabetic.  Note was reviewed.  Concern for possible PVCs.  He was started on potassium 20 meq daily. Palpitations are less frequent but still occurring from time to time. No correlation with activity. He has yet to set up an appt with his cardiologist.  He then went back to UC on 9/17 with viral symptoms. He was not COVID or flu tested. Strep was negative. No imaging or labs collected. He was encouraged to treat with OTC medications and push fluids. He's still having trouble with fatigue, nasal congestion/drainage, and increased cough. He's mostly producing clear phlegm but will occasionally get up a hint of yellow mucus in the morning. Breathing feels a little more winded than his usual but he's still able to work and complete ADLs without any trouble. He denies any fevers, chills, hemoptysis, leg swelling, orthopnea, PND. Eating and drinking normally. He has lost around 20 lb over the past few months. 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 Also cut out sweets. No skin lesions or vision changes. No inhaler use. Not using anything OTC right now.  Wearing CPAP nightly. Needs new supplies. Sleeps well with this. Rested for the most part. No drowsy driving.   06/13/8117-1/47/8295: CPAP 8-12 cmH2O 30/30 days; 100% >4 hr; average use 8 hr 11 min Pressure 95th 8.7 Leaks 95th 18.1 AHI 0.1  Allergies  Allergen Reactions   Latex Itching and Rash  GLOVES   Pistachio Nut Extract Nausea Only    There is no immunization history on file for this patient. Past Medical History:  Diagnosis Date   Anxiety    Arthritis    Depression    NO MEDS   GERD (gastroesophageal reflux disease)    NO MEDS   Hypertension    OSA on CPAP    Pneumonia    JUNE 2020   Sleep apnea     Tobacco History: Social History   Tobacco Use  Smoking Status Every Day   Current packs/day: 0.50    Average packs/day: 0.5 packs/day for 24.7 years (12.4 ttl pk-yrs)   Types: Cigarettes   Start date: 03/07/1998  Smokeless Tobacco Never  Tobacco Comments   0.5PPD 11/04/2021   0.25 PPD 03/04/2022   Smoking 10 per day.  11/30/2022 hfb   Ready to quit: Not Answered Counseling given: Not Answered Tobacco comments: 0.5PPD 11/04/2021 0.25 PPD 03/04/2022 Smoking 10 per day.  11/30/2022 hfb   Outpatient Medications Prior to Visit  Medication Sig Dispense Refill   Accu-Chek Softclix Lancets lancets USE ONCE DAILY 100 each 3   amLODipine (NORVASC) 10 MG tablet TAKE 1 TABLET(10 MG) BY MOUTH EVERY MORNING 90 tablet 1   ARIPiprazole (ABILIFY) 2 MG tablet Take 2 mg by mouth daily. psych     atorvastatin (LIPITOR) 10 MG tablet TAKE 1 TABLET(10 MG) BY MOUTH DAILY 90 tablet 1   blood glucose meter kit and supplies Dispense based on patient and insurance preference. Use up to four times daily as directed. (FOR ICD-10 E10.9, E11.9). 1 each 0   glucose blood (ACCU-CHEK GUIDE) test strip USE ONCE DAILY 100 strip 2   metFORMIN (GLUCOPHAGE) 500 MG tablet Take 1 tablet (500 mg total) by mouth daily. 90 tablet 1   metoprolol succinate (TOPROL XL) 25 MG 24 hr tablet Take 1 tablet (25 mg total) by mouth daily. 90 tablet 3   Multiple Vitamin (MULTI-VITAMIN) tablet Take 1 tablet by mouth daily.     pantoprazole (PROTONIX) 20 MG tablet TAKE 1 TABLET(20 MG) BY MOUTH DAILY 90 tablet 0   potassium chloride (KLOR-CON) 10 MEQ tablet Take 1 tablet (10 mEq total) by mouth daily. 30 tablet 0   UNABLE TO FIND CPAP AT NIGHT     No facility-administered medications prior to visit.     Review of Systems:   Constitutional: No night sweats, fevers, chills, or lassitude. +weight loss, fatigue  HEENT: No headaches, difficulty swallowing, tooth/dental problems, or sore throat. No sneezing, itching, ear ache. No sinus tenderness. +nasal congestion/drainage, post nasal drip CV:  +palpitations. No chest pain, orthopnea, PND,  swelling in lower extremities, anasarca, dizziness, syncope Resp: +shortness of breath with exertion; AM productive cough. No excess mucus or change in color of mucus. No hemoptysis. No wheezing.  No chest wall deformity GI:  No heartburn, indigestion, abdominal pain, nausea, vomiting, diarrhea, change in bowel habits, loss of appetite, bloody stools.  GU: No dysuria, change in color of urine, urgency or frequency.  Skin: No rash, lesions, ulcerations MSK:  No joint pain or swelling.   Neuro: No dizziness or lightheadedness.  Psych: No depression or anxiety. Mood stable.   Observations/Objective: Patient is well-developed, well-nourished in no acute distress. A&Ox3. Resting comfortably at home. Unlabored breathing. Speech is clear and coherent with logical content.    Assessment and Plan: Post-viral cough syndrome Post viral cough/nasal congestion. No sinus tenderness or evidence of bacterial infection so would hold off on abx at  this time. Question potential sarcoid flare? Limited evaluation due to virtual visit setting. Will treat him with prednisone burst. Supportive care measures. Encouraged him to be consistent with treating sinus symptoms. Call if no improvement.   Patient Instructions  -Saline nasal rinses twice a day with bottled, distilled water. Follow with flonase nasal spray 2 sprays each nostril daily about 20-30 minutes after your morning sinus rinse -Mucinex DM Twice daily for cough/congestion -Zyrtec 1 tab daily for allergies/congestion -Prednisone 40 mg daily for 5 days. Take in AM with food. -Albuterol inhaler 2 puffs every 6 hours as needed for shortness of breath or wheezing  Contact your heart doctor to set up an appointment to discuss the palpitations. Go to the emergency department if symptoms worsen or you develop new chest pain.   Orders placed for new CPAP supplies  Follow up in 4-6 weeks after PFT at [facility] with Dr. Jayme Cloud or Florentina Addison Joren Rehm,NP. If symptoms do not  improve or worsen, please contact office for sooner follow up or seek emergency care.    Sarcoidosis See above. Slight decline in previous FEV1. Will have him repeat PFTs at follow up and likely repeat CXR.   URI (upper respiratory infection) See above.   Palpitations Encouraged to follow up with cardiology. ED precautions advised.   OSA (obstructive sleep apnea) Excellent compliance and control. Receives benefit from use. Orders sent for new supplies. Aware of safe driving practices.       I discussed the assessment and treatment plan with the patient. The patient was provided an opportunity to ask questions and all were answered. The patient agreed with the plan and demonstrated an understanding of the instructions.   The patient was advised to call back or seek an in-person evaluation if the symptoms worsen or if the condition fails to improve as anticipated.  I provided 35 minutes of non-face-to-face time during this encounter.   Noemi Chapel, NP

## 2022-11-30 NOTE — Assessment & Plan Note (Signed)
Excellent compliance and control. Receives benefit from use. Orders sent for new supplies. Aware of safe driving practices.

## 2022-11-30 NOTE — Assessment & Plan Note (Signed)
Post viral cough/nasal congestion. No sinus tenderness or evidence of bacterial infection so would hold off on abx at this time. Question potential sarcoid flare? Limited evaluation due to virtual visit setting. Will treat him with prednisone burst. Supportive care measures. Encouraged him to be consistent with treating sinus symptoms. Call if no improvement.   Patient Instructions  -Saline nasal rinses twice a day with bottled, distilled water. Follow with flonase nasal spray 2 sprays each nostril daily about 20-30 minutes after your morning sinus rinse -Mucinex DM Twice daily for cough/congestion -Zyrtec 1 tab daily for allergies/congestion -Prednisone 40 mg daily for 5 days. Take in AM with food. -Albuterol inhaler 2 puffs every 6 hours as needed for shortness of breath or wheezing  Contact your heart doctor to set up an appointment to discuss the palpitations. Go to the emergency department if symptoms worsen or you develop new chest pain.   Orders placed for new CPAP supplies  Follow up in 4-6 weeks after PFT at [facility] with Dr. Jayme Cloud or Florentina Addison Katrianna Friesenhahn,NP. If symptoms do not improve or worsen, please contact office for sooner follow up or seek emergency care.

## 2022-11-30 NOTE — Patient Instructions (Addendum)
-  Saline nasal rinses twice a day with bottled, distilled water. Follow with flonase nasal spray 2 sprays each nostril daily about 20-30 minutes after your morning sinus rinse -Mucinex DM Twice daily for cough/congestion -Zyrtec 1 tab daily for allergies/congestion -Prednisone 40 mg daily for 5 days. Take in AM with food. -Albuterol inhaler 2 puffs every 6 hours as needed for shortness of breath or wheezing  Contact your heart doctor to set up an appointment to discuss the palpitations. Go to the emergency department if symptoms worsen or you develop new chest pain.   Orders placed for new CPAP supplies  Follow up in 4-6 weeks after PFT at [facility] with Dr. Jayme Mcknight or Luis Addison Alfonzo Arca,NP. If symptoms do not improve or worsen, please contact office for sooner follow up or seek emergency care.

## 2022-11-30 NOTE — Assessment & Plan Note (Signed)
Encouraged to follow up with cardiology. ED precautions advised.

## 2022-12-02 ENCOUNTER — Other Ambulatory Visit: Payer: Self-pay | Admitting: Family Medicine

## 2022-12-02 DIAGNOSIS — I10 Essential (primary) hypertension: Secondary | ICD-10-CM

## 2022-12-03 ENCOUNTER — Encounter: Payer: Self-pay | Admitting: Family Medicine

## 2022-12-03 NOTE — Telephone Encounter (Signed)
Patient to be seen in mid October.  Will be given 30 days and encourage patient to come in for his blood pressure check as scheduled.

## 2022-12-04 ENCOUNTER — Other Ambulatory Visit: Payer: Self-pay | Admitting: Family Medicine

## 2022-12-04 DIAGNOSIS — I10 Essential (primary) hypertension: Secondary | ICD-10-CM

## 2022-12-04 MED ORDER — TRIAMTERENE-HCTZ 75-50 MG PO TABS
1.0000 | ORAL_TABLET | Freq: Every day | ORAL | 0 refills | Status: DC
Start: 2022-12-04 — End: 2022-12-13

## 2022-12-13 ENCOUNTER — Ambulatory Visit (INDEPENDENT_AMBULATORY_CARE_PROVIDER_SITE_OTHER): Payer: Medicaid Other | Admitting: Family Medicine

## 2022-12-13 ENCOUNTER — Encounter: Payer: Self-pay | Admitting: Family Medicine

## 2022-12-13 VITALS — BP 118/68 | HR 82 | Ht 74.0 in | Wt 225.0 lb

## 2022-12-13 DIAGNOSIS — R002 Palpitations: Secondary | ICD-10-CM | POA: Diagnosis not present

## 2022-12-13 DIAGNOSIS — I10 Essential (primary) hypertension: Secondary | ICD-10-CM | POA: Diagnosis not present

## 2022-12-13 MED ORDER — AMLODIPINE BESYLATE 10 MG PO TABS
ORAL_TABLET | ORAL | 1 refills | Status: DC
Start: 2022-12-13 — End: 2023-05-29

## 2022-12-13 MED ORDER — TRIAMTERENE-HCTZ 75-50 MG PO TABS: 1.0000 | ORAL_TABLET | Freq: Every day | ORAL | 1 refills | Status: DC

## 2022-12-13 NOTE — Progress Notes (Signed)
Date:  12/13/2022   Name:  Luis Mcknight   DOB:  1980/12/15   MRN:  [id number]   Chief Complaint: Hypertension  Hypertension This is a chronic problem. The current episode started more than 1 year ago. The problem has been gradually improving since onset. The problem is controlled. Associated symptoms include palpitations. Pertinent negatives include no anxiety, blurred vision, chest pain, headaches, malaise/fatigue, neck pain, orthopnea, peripheral edema, PND, shortness of breath or sweats. There are no associated agents to hypertension. There are no known risk factors for coronary artery disease.  Palpitations  This is a new problem. The current episode started more than 1 month ago. The problem occurs intermittently. Pertinent negatives include no anxiety, chest pain, coughing, diaphoresis, dizziness, malaise/fatigue, nausea, near-syncope, shortness of breath or vomiting. Associated symptoms comments: lighthead.    Lab Results  Component Value Date   NA 135 11/04/2022   K 3.4 (L) 11/04/2022   CO2 26 11/04/2022   GLUCOSE 109 (H) 11/04/2022   BUN 27 (H) 11/04/2022   CREATININE 1.11 11/04/2022   CALCIUM 9.0 11/04/2022   EGFR 81 05/30/2022   GFRNONAA >60 11/04/2022   Lab Results  Component Value Date   CHOL 132 05/30/2022   HDL 33 (L) 05/30/2022   LDLCALC 55 05/30/2022   TRIG 283 (H) 05/30/2022   Lab Results  Component Value Date   TSH 0.649 11/04/2022   Lab Results  Component Value Date   HGBA1C 6.3 (H) 08/29/2022   Lab Results  Component Value Date   WBC 8.4 11/04/2022   HGB 14.2 11/04/2022   HCT 42.2 11/04/2022   MCV 80.8 11/04/2022   PLT 275 11/04/2022   Lab Results  Component Value Date   ALT 30 11/04/2022   AST 31 11/04/2022   ALKPHOS 65 11/04/2022   BILITOT 0.9 11/04/2022   No results found for: "25OHVITD2", "25OHVITD3", "VD25OH"   Review of Systems  Constitutional:  Negative for diaphoresis and malaise/fatigue.  Eyes:  Negative for blurred  vision.  Respiratory:  Negative for cough, chest tightness, shortness of breath and wheezing.   Cardiovascular:  Positive for palpitations. Negative for chest pain, orthopnea, leg swelling, PND and near-syncope.  Gastrointestinal:  Negative for nausea and vomiting.  Musculoskeletal:  Negative for neck pain.  Neurological:  Negative for dizziness and headaches.  Psychiatric/Behavioral:  The patient is not nervous/anxious.     Patient Active Problem List   Diagnosis Date Noted   Post-viral cough syndrome 11/30/2022   URI (upper respiratory infection) 11/30/2022   Nonspecific syndrome suggestive of viral illness 11/22/2022   Palpitations 05/12/2021   Psychophysiological insomnia 11/26/2020   History of pericarditis 11/06/2020   Metatarsalgia, right foot 08/28/2020   Adjustment reaction with anxiety and depression 08/28/2020   Fibromyalgia 08/28/2020   Plantar fasciitis, right 07/14/2020   Cervical spondylosis with radiculopathy 06/01/2020   Supraspinatus syndrome, 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   Lumbar spondylosis 06/01/2020   Plantar fasciitis, 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   Chest pain of uncertain etiology 09/04/2019   OSA (obstructive sleep apnea) 06/04/2019   Current smoker 06/04/2019   Healthcare maintenance 06/04/2019   Acute pericarditis 04/03/2019   Sarcoidosis 10/16/2018   Lung nodules    Adenopathy    Essential hypertension 11/28/2017    Allergies  Allergen Reactions   Latex Itching and Rash    GLOVES   Pistachio Nut Extract Nausea Only    Past Surgical History:  Procedure Laterality Date   ELECTROMAGNETIC NAVIGATION BROCHOSCOPY Left 10/08/2018   Procedure: ELECTROMAGNETIC NAVIGATION BRONCHOSCOPY,  LEFT;  Surgeon: Salena Saner, MD;  Location: [facility];  Service: Cardiopulmonary;  Laterality: Left;   ENDOBRONCHIAL ULTRASOUND Left 10/08/2018   Procedure: ENDOBRONCHIAL ULTRASOUND, LEFT;  Surgeon: Salena Saner, MD;  Location: [facility];  Service: Cardiopulmonary;  Laterality: Left;    NO PAST SURGERIES      Social History   Tobacco Use   Smoking status: Every Day    Current packs/day: 0.50    Average packs/day: 0.5 packs/day for 24.8 years (12.4 ttl pk-yrs)    Types: Cigarettes    Start date: 03/07/1998   Smokeless tobacco: Never   Tobacco comments:    0.5PPD 11/04/2021    0.25 PPD 03/04/2022    Smoking 10 per day.  11/30/2022 hfb  Vaping Use   Vaping status: Former   Start date: 04/18/2012   Quit date: 04/18/2016  Substance Use Topics   Alcohol use: Not Currently   Drug use: Not Currently     Medication list has been reviewed and updated.  Current Meds  Medication Sig   Accu-Chek Softclix Lancets lancets USE ONCE DAILY   albuterol (VENTOLIN HFA) 108 (90 Base) MCG/ACT inhaler Inhale 2 puffs into the lungs every 6 (six) hours as needed for wheezing or shortness of breath.   amLODipine (NORVASC) 10 MG tablet TAKE 1 TABLET(10 MG) BY MOUTH EVERY MORNING   ARIPiprazole (ABILIFY) 2 MG tablet Take 2 mg by mouth daily. psych   atorvastatin (LIPITOR) 10 MG tablet TAKE 1 TABLET(10 MG) BY MOUTH DAILY   blood glucose meter kit and supplies Dispense based on patient and insurance preference. Use up to four times daily as directed. (FOR ICD-10 E10.9, E11.9).   fluticasone (FLONASE) 50 MCG/ACT nasal spray Place 2 sprays into both nostrils daily.   glucose blood (ACCU-CHEK GUIDE) test strip USE ONCE DAILY   metFORMIN (GLUCOPHAGE) 500 MG tablet Take 1 tablet (500 mg total) by mouth daily.   Multiple Vitamin (MULTI-VITAMIN) tablet Take 1 tablet by mouth daily.   pantoprazole (PROTONIX) 20 MG tablet TAKE 1 TABLET(20 MG) BY MOUTH DAILY   potassium chloride (KLOR-CON) 10 MEQ tablet Take 1 tablet (10 mEq total) by mouth daily.   triamterene-hydrochlorothiazide (MAXZIDE) 75-50 MG tablet Take 1 tablet by mouth daily.   UNABLE TO FIND CPAP AT NIGHT       12/13/2022    1:50 PM 08/29/2022   10:08 AM 05/30/2022   10:49 AM 02/15/2022    9:55 AM  GAD 7 : Generalized Anxiety Score   Nervous, Anxious, on Edge 3 0 0 0  Control/stop worrying 3 0 0 0  Worry too much - different things 3 0 0 0  Trouble relaxing 3 0 0 0  Restless 2 0 0 0  Easily annoyed or irritable 2 0 0 0  Afraid - awful might happen 3 0 0 0  Total GAD 7 Score 19 0 0 0  Anxiety Difficulty Somewhat difficult Not difficult at all Not difficult at all Not difficult at all       12/13/2022    1:50 PM 08/29/2022   10:07 AM 05/30/2022   10:49 AM  Depression screen PHQ 2/9  Decreased Interest 2 0 0  Down, Depressed, Hopeless 2 1 0  PHQ - 2 Score 4 1 0  Altered sleeping 0 0 0  Tired, decreased energy 3 0 0  Change in appetite 0 0 0  Feeling bad or failure about yourself  2 0 0  Trouble concentrating 2 0 0  Moving  slowly or fidgety/restless 1 0 0  Suicidal thoughts 0 0 0  PHQ-9 Score 12 1 0  Difficult doing work/chores Somewhat difficult Not difficult at all Not difficult at all    BP Readings from Last 3 Encounters:  12/13/22 118/68  11/22/22 120/77  11/04/22 124/84    Physical Exam Vitals and nursing note reviewed.  HENT:     Head: Normocephalic.     Right Ear: External ear normal.     Left Ear: External ear normal.     Nose: Nose normal.  Eyes:     Pupils: Pupils are equal, round, and reactive to light.  Neck:     Thyroid: No thyromegaly.     Vascular: No JVD.     Trachea: No tracheal deviation.  Cardiovascular:     Rate and Rhythm: Normal rate and regular rhythm.     Heart sounds: Normal heart sounds. No murmur heard.    No friction rub. No gallop.  Pulmonary:     Effort: No respiratory distress.     Breath sounds: Normal breath sounds. No wheezing, rhonchi or rales.  Abdominal:     General: Bowel sounds are normal.     Palpations: Abdomen is soft. There is no mass.     Tenderness: There is no abdominal tenderness. There is no guarding or rebound.  Musculoskeletal:     Cervical back: Normal range of motion and neck supple.  Lymphadenopathy:     Cervical: No cervical  adenopathy.  Skin:    General: Skin is warm.  Neurological:     Mental Status: He is alert.     Wt Readings from Last 3 Encounters:  12/13/22 225 lb (102.1 kg)  11/04/22 240 lb 15.4 oz (109.3 kg)  10/14/22 241 lb (109.3 kg)    BP 118/68   Pulse 82   Ht 6\' 2"  (1.88 m)   Wt 225 lb (102.1 kg)   SpO2 97%   BMI 28.89 kg/m   Assessment and Plan: 1. Essential hypertension Chronic.  Controlled.  Stable.  Asymptomatic.  Blood pressure today is 118/68.  Tolerating current medication regimen well.  Except patient did on his own stop his metoprolol because he read about issues of diabetes and beta-blockers.  I told him that this is probably more theoretical and it would have been in the context of his heart condition and palpitations in his best interest to have continued his medication but we will make an appointment for him to return to his cardiologist who is Dr. And and have them perhaps reevaluate perhaps with monitoring in the ambulatory setting to determine the extent of these palpitations.  Patient will return amlodipine 10 mg once a day and Maxide 75-50 once a day. - Ambulatory referral to Cardiology - amLODipine (NORVASC) 10 MG tablet; TAKE 1 TABLET(10 MG) BY MOUTH EVERY MORNING  Dispense: 90 tablet; Refill: 1 - triamterene-hydrochlorothiazide (MAXZIDE) 75-50 MG tablet; Take 1 tablet by mouth daily.  Dispense: 90 tablet; Refill: 1  2. Palpitations Onset a month ago but I have a feeling that is been going on longer than this on an intermittent basis.  I do not see the context that he is having these and what may be the etiology whether it is breakthrough excitation from going into atrial fibrillation but patient is having a normal pulse today without irregularity.  We will refer to cardiology for reevaluation of these palpitations and whether or not to proceed with resuming his metoprolol given that his blood pressure is  in excellent range.    Elizabeth Sauer, MD

## 2022-12-18 NOTE — Progress Notes (Deleted)
  Cardiology Office Note:  .   Date:  12/18/2022  ID:  [name], DOB [birth year], MRN [id number] PCP: Duanne Limerick, MD  [facility] Providers Cardiologist:  Yvonne Kendall, MD { Click to update primary MD,subspecialty MD or APP then REFRESH:1}    History of Present Illness: .   Luis Mcknight is a 42 y.o. male with history of pericarditis, sarcoidosis, hypertension, arthritis, and GERD, who presents for follow-up of prior pericarditis as well as hypertension.  I last saw him in 05/2021, at which time he was feeling fairly well though he continued to complain of sporadic left-sided chest pain.  It would only last for a few seconds at a time and was unrelated to exertion.  We did not make any medication changes or pursue additional testing.  ROS: See HPI  Studies Reviewed: .        Cardiac MRI (04/29/2019): Normal LV size and function (LVEF 70%).  Normal enhancement without evidence of scar, infiltration, or cardiac sarcoidosis, normal RV size and function.  Trivial MR and AI.  No pericardial effusion.  Risk Assessment/Calculations:   {Does this patient have ATRIAL FIBRILLATION?:[id number]} No BP recorded.  {Refresh Note OR Click here to enter BP  :1}***       Physical Exam:   VS:  There were no vitals taken for this visit.   Wt Readings from Last 3 Encounters:  12/13/22 225 lb (102.1 kg)  11/04/22 240 lb 15.4 oz (109.3 kg)  10/14/22 241 lb (109.3 kg)    General:  NAD. Neck: No JVD or HJR. Lungs: Clear to auscultation bilaterally without wheezes or crackles. Heart: Regular rate and rhythm without murmurs, rubs, or gallops. Abdomen: Soft, nontender, nondistended. Extremities: No lower extremity edema.  ASSESSMENT AND PLAN: .    ***    {Are you ordering a CV Procedure (e.g. stress test, cath, DCCV, TEE, etc)?   Press F2        :[id number]}  Dispo: ***  Signed, Yvonne Kendall, MD

## 2022-12-19 ENCOUNTER — Ambulatory Visit: Payer: Medicaid Other | Admitting: Internal Medicine

## 2022-12-20 ENCOUNTER — Other Ambulatory Visit: Payer: Self-pay

## 2022-12-20 ENCOUNTER — Encounter: Payer: Self-pay | Admitting: Nurse Practitioner

## 2022-12-20 ENCOUNTER — Ambulatory Visit: Payer: Medicaid Other | Attending: Internal Medicine | Admitting: Nurse Practitioner

## 2022-12-20 ENCOUNTER — Ambulatory Visit: Payer: Medicaid Other

## 2022-12-20 VITALS — BP 120/50 | HR 82 | Ht 74.0 in | Wt 228.4 lb

## 2022-12-20 DIAGNOSIS — I10 Essential (primary) hypertension: Secondary | ICD-10-CM

## 2022-12-20 DIAGNOSIS — G4733 Obstructive sleep apnea (adult) (pediatric): Secondary | ICD-10-CM | POA: Diagnosis not present

## 2022-12-20 DIAGNOSIS — D869 Sarcoidosis, unspecified: Secondary | ICD-10-CM | POA: Diagnosis not present

## 2022-12-20 DIAGNOSIS — R002 Palpitations: Secondary | ICD-10-CM

## 2022-12-20 DIAGNOSIS — E876 Hypokalemia: Secondary | ICD-10-CM | POA: Diagnosis not present

## 2022-12-20 DIAGNOSIS — Z720 Tobacco use: Secondary | ICD-10-CM

## 2022-12-20 NOTE — Progress Notes (Signed)
Office Visit    Patient Name: Luis Mcknight Date of Encounter: 12/20/2022  Primary Care Provider:  Duanne Limerick, MD Primary Cardiologist:  Yvonne Kendall, MD  Chief Complaint    42 y.o. male with a history of sarcoidosis, pericarditis, hypertension, GERD, arthritis, prediabetes, and sleep apnea, who presents for follow-up related to palpitations.  Past Medical History    Past Medical History:  Diagnosis Date   Anxiety    Arthritis    Depression    NO MEDS   GERD (gastroesophageal reflux disease)    Hypertension    OSA on CPAP    Pericarditis    a. 12/2018 Echo: EF 60-65%, no rwma, nl RV size/fx, triv TR/MR, Ao root 38mm. Mod dil PA.   Pneumonia    JUNE 2020   Pre-diabetes    Sarcoidosis    Sleep apnea    Past Surgical History:  Procedure Laterality Date   ELECTROMAGNETIC NAVIGATION BROCHOSCOPY Left 10/08/2018   Procedure: ELECTROMAGNETIC NAVIGATION BRONCHOSCOPY, LEFT;  Surgeon: Salena Saner, MD;  Location: [facility];  Service: Cardiopulmonary;  Laterality: Left;   ENDOBRONCHIAL ULTRASOUND Left 10/08/2018   Procedure: ENDOBRONCHIAL ULTRASOUND, LEFT;  Surgeon: Salena Saner, MD;  Location: [facility];  Service: Cardiopulmonary;  Laterality: Left;   NO PAST SURGERIES      Allergies  Allergies  Allergen Reactions   Latex Itching and Rash    GLOVES   Pistachio Nut Extract Nausea Only    History of Present Illness      42 y.o. y/o male with a history of sarcoidosis, pericarditis, hypertension, GERD, arthritis, prediabetes, and sleep apnea.  He was diagnosed with pericarditis in January 2020 and required prolonged course of colchicine therapy.  He has been followed closely by pulmonology in the setting of pulmonary sarcoidosis.  Prior echo in October 2020 showed an EF of 60 to 65%, without significant valvular disease and with  and mildly dilated aortic root at 38 mm.   [name] was last seen in cardiology clinic in March 2023, at which time he reported  ongoing, intermittent left-sided sharp chest pain of brief duration.  Recommendations made for follow-up in 1 year.  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  Since then, he has been experiencing increased frequency of palpitations throughout the day.  Episodes are typically brief but recur regularly without any associated symptoms.  He notes that he is pretty busy at work and has lost about 20 pounds since starting there.  In the setting of diabetes, he did cut out sweet tea and replace it with diet sodas, which he thinks may be contributing to his palpitations.  He otherwise is unaware of potential triggers.  He was seen at urgent care on August 30 due to palpitations, and was noted to be hypokalemic at 3.4.  He has been taking a supplement since without significant change in symptoms.  Though metoprolol is on his medicine list, he says he has not been taking it due to concerns about elevated blood glucoses.  He denies chest pain, dyspnea, PND, orthopnea, dizziness, syncope, edema, or early satiety. Objective   Home Medications    Current Outpatient Medications  Medication Sig Dispense Refill   Accu-Chek Softclix Lancets lancets USE ONCE DAILY 100 each 3   albuterol (VENTOLIN HFA) 108 (90 Base) MCG/ACT inhaler Inhale 2 puffs into the lungs every 6 (six) hours as needed for wheezing or shortness of breath. 8 g 2   amLODipine (NORVASC)  10 MG tablet TAKE 1 TABLET(10 MG) BY MOUTH EVERY MORNING 90 tablet 1   ARIPiprazole (ABILIFY) 2 MG tablet Take 2 mg by mouth daily. psych     atorvastatin (LIPITOR) 10 MG tablet TAKE 1 TABLET(10 MG) BY MOUTH DAILY 90 tablet 1   blood glucose meter kit and supplies Dispense based on patient and insurance preference. Use up to four times daily as directed. (FOR ICD-10 E10.9, E11.9). 1 each 0   fluticasone (FLONASE) 50 MCG/ACT nasal spray Place 2 sprays into both nostrils daily. 18.2 mL 2   glucose blood (ACCU-CHEK GUIDE) test strip USE ONCE DAILY  100 strip 2   metFORMIN (GLUCOPHAGE) 500 MG tablet Take 1 tablet (500 mg total) by mouth daily. 90 tablet 1   Multiple Vitamin (MULTI-VITAMIN) tablet Take 1 tablet by mouth daily.     pantoprazole (PROTONIX) 20 MG tablet TAKE 1 TABLET(20 MG) BY MOUTH DAILY 90 tablet 0   potassium chloride (KLOR-CON) 10 MEQ tablet Take 1 tablet (10 mEq total) by mouth daily. 30 tablet 0   triamterene-hydrochlorothiazide (MAXZIDE) 75-50 MG tablet Take 1 tablet by mouth daily. 90 tablet 1   UNABLE TO FIND CPAP AT NIGHT     No current facility-administered medications for this visit.     Review of Systems    Brief but frequent palpitations without associated symptoms as outlined above.  He denies chest pain, dyspnea, PND, orthopnea, dizziness, syncope, edema, or early satiety.  All other systems reviewed and are otherwise negative except as noted above.    Physical Exam    VS:  BP (!) 120/50   Pulse 82   Ht 6\' 2"  (1.88 m)   Wt 228 lb 6.4 oz (103.6 kg)   SpO2 96%   BMI 29.32 kg/m  , BMI Body mass index is 29.32 kg/m.     GEN: Well nourished, well developed, in no acute distress. HEENT: normal. Neck: Supple, no JVD, carotid bruits, or masses. Cardiac: RRR, no murmurs, rubs, or gallops. No clubbing, cyanosis, edema.  Radials 2+/PT 2+ and equal bilaterally.  Respiratory:  Respirations regular and unlabored, clear to auscultation bilaterally. GI: Soft, nontender, nondistended, BS + x 4. MS: no deformity or atrophy. Skin: warm and dry, no rash. Neuro:  Strength and sensation are intact. Psych: Normal affect.  Accessory Clinical Findings    ECG personally reviewed by me today - EKG Interpretation Date/Time:  Tuesday December 20 2022 10:03:36 EDT Ventricular Rate:  81 PR Interval:  148 QRS Duration:  80 QT Interval:  384 QTC Calculation: 446 R Axis:   55  Text Interpretation: Sinus rhythm with occasional Premature ventricular complexes Confirmed by Nicolasa Ducking [phone]) on 12/20/2022 10:11:13  AM  - no acute changes.  Lab Results  Component Value Date   WBC 8.4 11/04/2022   HGB 14.2 11/04/2022   HCT 42.2 11/04/2022   MCV 80.8 11/04/2022   PLT 275 11/04/2022   Lab Results  Component Value Date   CREATININE 1.11 11/04/2022   BUN 27 (H) 11/04/2022   NA 135 11/04/2022   K 3.4 (L) 11/04/2022   CL 100 11/04/2022   CO2 26 11/04/2022   Lab Results  Component Value Date   ALT 30 11/04/2022   AST 31 11/04/2022   ALKPHOS 65 11/04/2022   BILITOT 0.9 11/04/2022   Lab Results  Component Value Date   CHOL 132 05/30/2022   HDL 33 (L) 05/30/2022   LDLCALC 55 05/30/2022   TRIG 283 (H) 05/30/2022  Lab Results  Component Value Date   HGBA1C 6.3 (H) 08/29/2022   Lab Results  Component Value Date   TSH 0.649 11/04/2022       Assessment & Plan    1.  Palpitations: Over the past 3 months, patient has been experiencing brief but frequent palpitations without associated symptoms.  They were bothersome enough that he presented to an urgent care in late August at which time, his potassium was 3.4.  He has since been taking a potassium supplement without change in symptoms.  He has 1 PVC on his ECG today, though notes that today symptoms are better.  Metoprolol is on his medicine list however he has not been taking, and prefers to avoid at least until monitoring is complete.  I will follow-up a basic metabolic panel and magnesium today.  TSH was normal in August.  Will place a 7-day ZIO XT and plan to follow-up in 1 month.  2.  Primary hypertension: Controlled at 120/50 on amlodipine and triamterene-HCTZ.  As outlined above, not currently taking metoprolol.  We discussed that if resumption is necessary, we may need to scale back amlodipine.  3.  Obstructive sleep apnea: Compliant with CPAP.  Followed by pulmonology.  4.  Sarcoidosis: Occasional twinge of discomfort in his left upper abdomen/side with position changes but overall, this symptom has been stable over several years.  He  is followed by pulmonology.  5.  Tobacco abuse: Cessation advised.  6.  Hypokalemia: Potassium 3.4 in August.  Follow-up today.  7.  Disposition: Follow-up basic metabolic panel and magnesium today.  Follow-up ZIO XT x 1 week.  Follow-up in clinic in 1 month or sooner if necessary.  Nicolasa Ducking, NP 12/20/2022, 10:39 AM

## 2022-12-20 NOTE — Patient Instructions (Signed)
Medication Instructions:  Your physician recommends that you continue on your current medications as directed. Please refer to the Current Medication list given to you today.  *If you need a refill on your cardiac medications before your next appointment, please call your pharmacy*   Lab Work: TODAY: BMET, MAGNESIUM  If you have labs (blood work) drawn today and your tests are completely normal, you will receive your results only by: MyChart Message (if you have MyChart) OR A paper copy in the mail If you have any lab test that is abnormal or we need to change your treatment, we will call you to review the results.   Testing/Procedures: EKG  ZIO XT- Long Term Monitor Instructions  Your physician has requested you wear a ZIO patch monitor for 7 days.  This is a single patch monitor. [employer] supplies one patch monitor per enrollment. Additional stickers are not available. Please do not apply patch if you will be having a Nuclear Stress Test,  Echocardiogram, Cardiac CT, MRI, or Chest Xray during the period you would be wearing the  monitor. The patch cannot be worn during these tests. You cannot remove and re-apply the  ZIO XT patch monitor.  Your ZIO patch monitor will be mailed 3 day USPS to your address on file. It may take 3-5 days  to receive your monitor after you have been enrolled.  Once you have received your monitor, please review the enclosed instructions. Your monitor  has already been registered assigning a specific monitor serial # to you.  Billing and Patient Assistance Program Information  We have supplied [employer] with any of your insurance information on file for billing purposes. [employer] offers a sliding scale Patient Assistance Program for patients that do not have  insurance, or whose insurance does not completely cover the cost of the ZIO monitor.  You must apply for the Patient Assistance Program to qualify for this discounted rate.  To apply, please call  [employer] at [phone], select option 4, select option 2, ask to apply for  Patient Assistance Program. Meredeth Ide will ask your household income, and how many people  are in your household. They will quote your out-of-pocket cost based on that information.  [employer] will also be able to set up a 4-month, interest-free payment plan if needed.  Applying the monitor   Shave hair from upper left chest.  Hold abrader disc by orange tab. Rub abrader in 40 strokes over the upper left chest as  indicated in your monitor instructions.  Clean area with 4 enclosed alcohol pads. Let dry.  Apply patch as indicated in monitor instructions. Patch will be placed under collarbone on left  side of chest with arrow pointing upward.  Rub patch adhesive wings for 2 minutes. Remove white label marked "1". Remove the white  label marked "2". Rub patch adhesive wings for 2 additional minutes.  While looking in a mirror, press and release button in center of patch. A small green light will  flash 3-4 times. This will be your only indicator that the monitor has been turned on.  Do not shower for the first 24 hours. You may shower after the first 24 hours.  Press the button if you feel a symptom. You will hear a small click. Record Date, Time and  Symptom in the Patient Logbook.  When you are ready to remove the patch, follow instructions on the last 2 pages of Patient  Logbook. Stick patch monitor onto the last page of Patient Logbook.  Place Patient Logbook in the blue and white box. Use locking tab on box and tape box closed  securely. The blue and white box has prepaid postage on it. Please place it in the mailbox as  soon as possible. Your physician should have your test results approximately 7 days after the  monitor has been mailed back to [facility].  Call [facility] Customer Care at [phone] if you have questions regarding  your ZIO XT patch monitor. Call them immediately if you see an orange  light blinking on your  monitor.  If your monitor falls off in less than 4 days, contact our Monitor department at [phone].  If your monitor becomes loose or falls off after 4 days call [employer] at [phone] for  suggestions on securing your monitor  Follow-Up: At [facility], you and your health needs are our priority.  As part of our continuing mission to provide you with exceptional heart care, we have created designated Provider Care Teams.  These Care Teams include your primary Cardiologist (physician) and Advanced Practice Providers (APPs -  Physician Assistants and Nurse Practitioners) who all work together to provide you with the care you need, when you need it.  We recommend signing up for the patient portal called "MyChart".  Sign up information is provided on this After Visit Summary.  MyChart is used to connect with patients for Virtual Visits (Telemedicine).  Patients are able to view lab/test results, encounter notes, upcoming appointments, etc.  Non-urgent messages can be sent to your provider as well.   To learn more about what you can do with MyChart, go to [email or web address].    Your next appointment:   1 month(s)  Provider:   You may see Yvonne Kendall, MD or one of the following Advanced Practice Providers on your designated Care Team:   Nicolasa Ducking, NP

## 2022-12-21 ENCOUNTER — Other Ambulatory Visit: Payer: Self-pay

## 2022-12-21 DIAGNOSIS — K219 Gastro-esophageal reflux disease without esophagitis: Secondary | ICD-10-CM

## 2022-12-21 LAB — BASIC METABOLIC PANEL
BUN/Creatinine Ratio: 15 (ref 9–20)
BUN: 17 mg/dL (ref 6–20)
CO2: 26 mmol/L (ref 20–29)
Calcium: 9.5 mg/dL (ref 8.7–10.2)
Chloride: 101 mmol/L (ref 96–106)
Creatinine, Ser: 1.1 mg/dL (ref 0.76–1.27)
Glucose: 94 mg/dL (ref 70–99)
Potassium: 3.9 mmol/L (ref 3.5–5.2)
Sodium: 141 mmol/L (ref 134–144)
eGFR: 88 mL/min/{1.73_m2} (ref >59)

## 2022-12-21 LAB — MAGNESIUM: Magnesium: 2.1 mg/dL (ref 1.6–2.3)

## 2022-12-21 MED ORDER — PANTOPRAZOLE SODIUM 20 MG PO TBEC
DELAYED_RELEASE_TABLET | ORAL | 0 refills | Status: DC
Start: 2022-12-21 — End: 2023-03-16

## 2022-12-22 ENCOUNTER — Ambulatory Visit: Payer: Medicaid Other | Admitting: Family Medicine

## 2022-12-24 DIAGNOSIS — R002 Palpitations: Secondary | ICD-10-CM

## 2023-01-15 ENCOUNTER — Encounter: Payer: Self-pay | Admitting: Internal Medicine

## 2023-01-16 MED ORDER — METOPROLOL SUCCINATE ER 25 MG PO TB24: 25.0000 mg | ORAL_TABLET | Freq: Every day | ORAL | 3 refills | Status: DC

## 2023-01-16 NOTE — Telephone Encounter (Signed)
The patient has been notified of the result along with recommendations. Pt verbalized understanding. All questions (if any) were answered    Creig Hines, NP 01/11/2023  2:09 PM EST     Predominantly normal rhythm at an average heart rate of 78 bpm.  Rare extra heartbeats from the top chambers and occasional extra beats from the bottom chambers accounting for 2.2% of all beats.  Triggered events were associated with normal rhythm and also with extra heartbeats from the bottom chambers (PVCs).  No significant arrhythmias.  He was previously on metoprolol but not taking at most recent visit.  Based on these findings and symptoms, I do recommend taking metoprolol xl 25 mg daily as previously prescribed.

## 2023-01-26 ENCOUNTER — Ambulatory Visit: Payer: Medicaid Other | Admitting: Nurse Practitioner

## 2023-01-26 ENCOUNTER — Encounter: Payer: Self-pay | Admitting: Nurse Practitioner

## 2023-01-26 DIAGNOSIS — G4733 Obstructive sleep apnea (adult) (pediatric): Secondary | ICD-10-CM | POA: Diagnosis not present

## 2023-01-26 NOTE — Progress Notes (Deleted)
Office Visit    Patient Name: Luis Mcknight Date of Encounter: 01/26/2023  Primary Care Provider:  Duanne Limerick, MD Primary Cardiologist:  Yvonne Kendall, MD  Chief Complaint    42 y.o. male with a history of sarcoidosis, pericarditis, hypertension, GERD, arthritis, prediabetes, and sleep apnea, who presents for follow-up related to palpitations.   Past Medical History  Subjective   Past Medical History:  Diagnosis Date   Anxiety    Arthritis    Depression    NO MEDS   GERD (gastroesophageal reflux disease)    Hypertension    OSA on CPAP    Pericarditis    a. 12/2018 Echo: EF 60-65%, no rwma, nl RV size/fx, triv TR/MR, Ao root 38mm. Mod dil PA.   Pneumonia    JUNE 2020   Pre-diabetes    PVC's (premature ventricular contractions)    a. 12/2022 Zio: Predominantly sinus rhythm at 78.  2.2% PVC burden.  Rare PACs.  Triggered events associated with sinus rhythm and PVCs.  Toprol-XL started.   Sarcoidosis    Sleep apnea    Past Surgical History:  Procedure Laterality Date   ELECTROMAGNETIC NAVIGATION BROCHOSCOPY Left 10/08/2018   Procedure: ELECTROMAGNETIC NAVIGATION BRONCHOSCOPY, LEFT;  Surgeon: Salena Saner, MD;  Location: [facility] ORS;  Service: Cardiopulmonary;  Laterality: Left;   ENDOBRONCHIAL ULTRASOUND Left 10/08/2018   Procedure: ENDOBRONCHIAL ULTRASOUND, LEFT;  Surgeon: Salena Saner, MD;  Location: [facility] ORS;  Service: Cardiopulmonary;  Laterality: Left;   NO PAST SURGERIES      Allergies  Allergies  Allergen Reactions   Latex Itching and Rash    GLOVES   Pistachio Nut Extract Nausea Only      History of Present Illness     42 y.o. y/o male with a history of sarcoidosis, pericarditis, hypertension, GERD, arthritis, prediabetes, and sleep apnea.  He was diagnosed with pericarditis in January 2020 and required prolonged course of colchicine therapy.  He has been followed closely by pulmonology in the setting of pulmonary sarcoidosis.  Prior echo  in October 2020 showed an EF of 60 to 65%, without significant valvular disease and with  and mildly dilated aortic root at 38 mm.  At office follow-up in October 2024, he reported more frequent palpitations while working, prompting urgent care visit, where he was found to be hypokalemic.  ZIO monitor was placed that showed predominantly sinus rhythm with rare PACs and occasional PVCs (2.2% PVC burden).  Triggered events were associated with sinus rhythm and PVCs.  He was advised to start Toprol-XL 25 mg daily.      Objective  Home Medications    Current Outpatient Medications  Medication Sig Dispense Refill   Accu-Chek Softclix Lancets lancets USE ONCE DAILY 100 each 3   albuterol (VENTOLIN HFA) 108 (90 Base) MCG/ACT inhaler Inhale 2 puffs into the lungs every 6 (six) hours as needed for wheezing or shortness of breath. 8 g 2   amLODipine (NORVASC) 10 MG tablet TAKE 1 TABLET(10 MG) BY MOUTH EVERY MORNING 90 tablet 1   ARIPiprazole (ABILIFY) 2 MG tablet Take 2 mg by mouth daily. psych     atorvastatin (LIPITOR) 10 MG tablet TAKE 1 TABLET(10 MG) BY MOUTH DAILY 90 tablet 1   blood glucose meter kit and supplies Dispense based on patient and insurance preference. Use up to four times daily as directed. (FOR ICD-10 E10.9, E11.9). 1 each 0   fluticasone (FLONASE) 50 MCG/ACT nasal spray Place 2 sprays into both nostrils daily.  18.2 mL 2   glucose blood (ACCU-CHEK GUIDE) test strip USE ONCE DAILY 100 strip 2   metFORMIN (GLUCOPHAGE) 500 MG tablet Take 1 tablet (500 mg total) by mouth daily. 90 tablet 1   metoprolol succinate (TOPROL-XL) 25 MG 24 hr tablet Take 1 tablet (25 mg total) by mouth daily. Take with or immediately following a meal. 90 tablet 3   Multiple Vitamin (MULTI-VITAMIN) tablet Take 1 tablet by mouth daily.     pantoprazole (PROTONIX) 20 MG tablet TAKE 1 TABLET(20 MG) BY MOUTH DAILY 90 tablet 0   potassium chloride (KLOR-CON) 10 MEQ tablet Take 1 tablet (10 mEq total) by mouth daily. 30  tablet 0   triamterene-hydrochlorothiazide (MAXZIDE) 75-50 MG tablet Take 1 tablet by mouth daily. 90 tablet 1   UNABLE TO FIND CPAP AT NIGHT     No current facility-administered medications for this visit.     Physical Exam    VS:  There were no vitals taken for this visit. , BMI There is no height or weight on file to calculate BMI.       GEN: Well nourished, well developed, in no acute distress. HEENT: normal. Neck: Supple, no JVD, carotid bruits, or masses. Cardiac: RRR, no murmurs, rubs, or gallops. No clubbing, cyanosis, edema.  Radials 2+/PT 2+ and equal bilaterally.  Respiratory:  Respirations regular and unlabored, clear to auscultation bilaterally. GI: Soft, nontender, nondistended, BS + x 4. MS: no deformity or atrophy. Skin: warm and dry, no rash. Neuro:  Strength and sensation are intact. Psych: Normal affect.  Accessory Clinical Findings    ECG personally reviewed by me today -    *** - no acute changes.  Lab Results  Component Value Date   WBC 8.4 11/04/2022   HGB 14.2 11/04/2022   HCT 42.2 11/04/2022   MCV 80.8 11/04/2022   PLT 275 11/04/2022   Lab Results  Component Value Date   CREATININE 1.10 12/20/2022   BUN 17 12/20/2022   NA 141 12/20/2022   K 3.9 12/20/2022   CL 101 12/20/2022   CO2 26 12/20/2022   Lab Results  Component Value Date   ALT 30 11/04/2022   AST 31 11/04/2022   ALKPHOS 65 11/04/2022   BILITOT 0.9 11/04/2022   Lab Results  Component Value Date   CHOL 132 05/30/2022   HDL 33 (L) 05/30/2022   LDLCALC 55 05/30/2022   TRIG 283 (H) 05/30/2022    Lab Results  Component Value Date   HGBA1C 6.3 (H) 08/29/2022   Lab Results  Component Value Date   TSH 0.649 11/04/2022       Assessment & Plan    1.  ***  Nicolasa Ducking, NP 01/26/2023, 8:25 AM

## 2023-01-28 NOTE — Progress Notes (Deleted)
   Cardiology Clinic Note   Date: 01/28/2023 ID: Newman Navratil, DOB 1980-08-09, MRN [id number]  Primary Cardiologist:  Yvonne Kendall, MD  Patient Profile    Luis Mcknight is a 42 y.o. male who presents to the clinic today for ***    Past medical history significant for: Pericarditis. Echo 12/11/2018: EF 60 to 65%.  No LVH.  Normal RV size/function.  Trace MR.  Trivial TR.  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  Moderately dilated pulmonary artery. Palpitations. 7-day ZIO 01/09/2023: Predominantly sinus rhythm.  HR 45 to 144 bpm, average 78 bpm.  Rare PACs.  Occasional PVCs (2.2% burden).  No sustained arrhythmia or prolonged pauses.  Patient triggered events corresponded to sinus rhythm and PVCs. Hypertension. Pulmonary sarcoidosis. Cardiac MRI 04/29/2019: Normal LV size/function, EF 70%.  Normal postgadolinium images with no evidence of scar, infiltration or sarcoid.  Normal RV size/function.  Trivial MR/AR.  No pericardial effusion. OSA. GERD. Prediabetes. Tobacco abuse.  In summary, patient was diagnosed with pericarditis in January 2020 requiring a prolonged course of colchicine.  He is followed closely by pulmonology for pulmonary sarcoidosis.  Echo October 2020 as above.  He underwent cardiac MRI in February 2021 to rule out cardiac sarcoidosis (detailed above).      History of Present Illness    Luis Mcknight is followed by Dr. Okey Dupre for the above outlined history.  Patient was last seen in the office by Ward Givens, NP on 12/20/2022 with complaints of palpitations.  He had been seen by urgent care and found to be hypokalemic.  ZIO monitor was placed which showed rare PACs and occasional PVCs (2.2% burden).  Triggered events were associated with sinus rhythm and PVCs.  He was started on Toprol  Discussed the use of AI scribe software for clinical note transcription with the patient, who gave verbal consent to proceed.         Palpitations 7-day ZIO November 2024 showed  NSR with rare PACs, occasional PVCs (2.2% burden).  Patient***  -Continue Toprol  History of pericarditis Echo October 2020 showed normal LV/RV function trace MR, trivial TR, mild dilatation of aortic root 38 mm.  Patient***  Hypertension BP today*** -Continue amlodipine, Toprol, Maxzide   ROS: All other systems reviewed and are otherwise negative except as noted in History of Present Illness.  Studies Reviewed       ***  Risk Assessment/Calculations    {Does this patient have ATRIAL FIBRILLATION?:[phone]} No BP recorded.  {Refresh Note OR Click here to enter BP  :1}***        Physical Exam    VS:  There were no vitals taken for this visit. , BMI There is no height or weight on file to calculate BMI.  GEN: Well nourished, well developed, in no acute distress. Neck: No JVD or carotid bruits. Cardiac: *** RRR. No murmurs. No rubs or gallops.   Respiratory:  Respirations regular and unlabored. Clear to auscultation without rales, wheezing or rhonchi. GI: Soft, nontender, nondistended. Extremities: Radials/DP/PT 2+ and equal bilaterally. No clubbing or cyanosis. No edema ***  Skin: Warm and dry, no rash. Neuro: Strength intact.  Assessment & Plan   Assessment and Plan               Disposition: ***     {Are you ordering a CV Procedure (e.g. stress test, cath, DCCV, TEE, etc)?   Press F2        :[id number]}   Signed, Etta Grandchild. Grecia Lynk, DNP, NP-C

## 2023-01-30 ENCOUNTER — Ambulatory Visit: Payer: Medicaid Other | Admitting: Student

## 2023-01-30 NOTE — Progress Notes (Unsigned)
Cardiology Clinic Note   Date: 01/31/2023 ID: Dierre Braham, DOB [birth year], MRN [id number]  Primary Cardiologist:  Yvonne Kendall, MD  Patient Profile    Luis Mcknight is a 42 y.o. male who presents to the clinic today for follow up after cardiac monitor.     Past medical history significant for: Pericarditis. Echo 12/11/2018: EF 60 to 65%.  No LVH.  Normal RV size/function.  Trace MR.  Trivial TR.  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  Moderately dilated pulmonary artery. Palpitations. 7-day ZIO 01/09/2023: Predominantly sinus rhythm.  HR 45 to 144 bpm, average 78 bpm.  Rare PACs.  Occasional PVCs (2.2% burden).  No sustained arrhythmia or prolonged pauses.  Patient triggered events corresponded to sinus rhythm and PVCs. Hypertension. Pulmonary sarcoidosis. Cardiac MRI 04/29/2019: Normal LV size/function, EF 70%.  Normal postgadolinium images with no evidence of scar, infiltration or sarcoid.  Normal RV size/function.  Trivial MR/AR.  No pericardial effusion. OSA. CPAP GERD. Prediabetes. Tobacco abuse.  In summary, patient was diagnosed with pericarditis in January 2020 requiring a prolonged course of colchicine.  He is followed closely by pulmonology for pulmonary sarcoidosis.  Echo October 2020 as above.  He underwent cardiac MRI in February 2021 to rule out cardiac sarcoidosis (detailed above).      History of Present Illness    Luis Mcknight is followed by Dr. Okey Dupre for the above outlined history.  Patient was last seen in the office by Ward Givens, NP on 12/20/2022 with complaints of palpitations.  He had been seen by urgent care and found to be hypokalemic.  ZIO monitor was placed which showed rare PACs and occasional PVCs (2.2% burden).  Triggered events were associated with sinus rhythm and PVCs.  He was started on Toprol  Patient presents for follow up after heart monitor. He reports less palpitations with current dose of Toprol but still experiencing them. He reports  stable occasional left upper abdomen/lower lateral chest discomfort and dyspnea associated with sarcoidosis. He denies lower extremity edema, orthopnea or PND. He is working on increasing his exercise. He continues to smoke <1/2 pack per day.      ROS: All other systems reviewed and are otherwise negative except as noted in History of Present Illness.  Studies Reviewed    EKG Interpretation Date/Time:  Tuesday January 31 2023 13:45:34 EST Ventricular Rate:  81 PR Interval:  152 QRS Duration:  84 QT Interval:  378 QTC Calculation: 439 R Axis:   27  Text Interpretation: Normal sinus rhythm Normal ECG When compared with ECG of 20-Dec-2022 10:03, Premature ventricular complexes are no longer Present Confirmed by Carlos Levering [phone]) on 01/31/2023 1:48:23 PM           Physical Exam    VS:  BP 124/66 (BP Location: Left Arm, Patient Position: Sitting, Cuff Size: Normal)   Pulse 81   Ht 6\' 2"  (1.88 m)   Wt 225 lb 9.6 oz (102.3 kg)   SpO2 98%   BMI 28.97 kg/m  , BMI Body mass index is 28.97 kg/m.  GEN: Well nourished, well developed, in no acute distress. Neck: No JVD or carotid bruits. Cardiac:  RRR. Occasional extrasystole.  No murmurs. No rubs or gallops.   Respiratory:  Respirations regular and unlabored. Clear to auscultation without rales, wheezing or rhonchi. GI: Soft, nontender, nondistended. Extremities: Radials/DP/PT 2+ and equal bilaterally. No clubbing or cyanosis. No edema/   Skin: Warm and dry, no rash. Neuro: Strength intact.  Assessment & Plan  Palpitations 7-day ZIO November 2024 showed NSR with rare PACs, occasional PVCs (2.2% burden).  Patient reports less palpitations on current dose of Toprol but still having breakthrough palpitations.  -Educated on triggers.   -Increase Toprol to 1.5 tablet daily (37.5 mg).   History of pulmonary sarcoidosis Patient reports stable occasional left upper abdomen/lower lateral chest discomfort and dyspnea related to  sarcoidosis that is stable.  -Continue to follow with pulmonology.   Hypertension BP today 124/66.  -Continue amlodipine, Toprol, Maxzide.  Tobacco abuse Patient smoke <1/2 pack of cigarettes per day. He is interested in quitting.  -Discussed slowly cutting back to complete cessation.      Disposition: Increase Toprol to 1.5 tablets per day (37.5 mg). Return in 6 months or sooner as needed.          Signed, Etta Grandchild. Adelena Desantiago, DNP, NP-C

## 2023-01-31 ENCOUNTER — Ambulatory Visit: Payer: Medicaid Other | Attending: Student | Admitting: Student

## 2023-01-31 ENCOUNTER — Encounter: Payer: Self-pay | Admitting: Student

## 2023-01-31 VITALS — BP 124/66 | HR 81 | Ht 74.0 in | Wt 225.6 lb

## 2023-01-31 DIAGNOSIS — D860 Sarcoidosis of lung: Secondary | ICD-10-CM

## 2023-01-31 DIAGNOSIS — Z720 Tobacco use: Secondary | ICD-10-CM

## 2023-01-31 DIAGNOSIS — I10 Essential (primary) hypertension: Secondary | ICD-10-CM

## 2023-01-31 DIAGNOSIS — R002 Palpitations: Secondary | ICD-10-CM

## 2023-01-31 MED ORDER — METOPROLOL SUCCINATE ER 25 MG PO TB24: ORAL_TABLET | ORAL | 1 refills | Status: DC

## 2023-01-31 NOTE — Patient Instructions (Signed)
Medication Instructions:  - Your physician has recommended you make the following change in your medication:   1) INCREASE Toprol XL (metoprolol succinate) 25 mg: - take 1.5 tablets (37.5 mg) by mouth once daily   *If you need a refill on your cardiac medications before your next appointment, please call your pharmacy*   Lab Work: - none ordered  If you have labs (blood work) drawn today and your tests are completely normal, you will receive your results only by: MyChart Message (if you have MyChart) OR A paper copy in the mail If you have any lab test that is abnormal or we need to change your treatment, we will call you to review the results.   Testing/Procedures: - none ordered   Follow-Up: At [facility], you and your health needs are our priority.  As part of our continuing mission to provide you with exceptional heart care, we have created designated Provider Care Teams.  These Care Teams include your primary Cardiologist (physician) and Advanced Practice Providers (APPs -  Physician Assistants and Nurse Practitioners) who all work together to provide you with the care you need, when you need it.  We recommend signing up for the patient portal called "MyChart".  Sign up information is provided on this After Visit Summary.  MyChart is used to connect with patients for Virtual Visits (Telemedicine).  Patients are able to view lab/test results, encounter notes, upcoming appointments, etc.  Non-urgent messages can be sent to your provider as well.   To learn more about what you can do with MyChart, go to [email or web address].    Your next appointment:   6 month(s)  Provider:   You may see Yvonne Kendall, MD or one of the following Advanced Practice Providers on your designated Care Team:    Carlos Levering, NP    Other Instructions N/a

## 2023-02-05 DIAGNOSIS — G4733 Obstructive sleep apnea (adult) (pediatric): Secondary | ICD-10-CM | POA: Diagnosis not present

## 2023-02-28 ENCOUNTER — Other Ambulatory Visit: Payer: Self-pay | Admitting: Family Medicine

## 2023-02-28 DIAGNOSIS — E1169 Type 2 diabetes mellitus with other specified complication: Secondary | ICD-10-CM

## 2023-02-28 MED ORDER — METFORMIN HCL 500 MG PO TABS
500.0000 mg | ORAL_TABLET | Freq: Every day | ORAL | 0 refills | Status: DC
Start: 2023-02-28 — End: 2023-05-27

## 2023-02-28 NOTE — Telephone Encounter (Signed)
Medication Refill -  Most Recent Primary Care Visit:  Provider: Duanne Limerick  Department: [facility] [place]  Visit Type: OFFICE VISIT  Date: 12/13/2022  Medication: metFORMIN (GLUCOPHAGE) 500 MG tablet   Has the patient contacted their pharmacy? Yes  Is this the correct pharmacy for this prescription? Yes  This is the patient's preferred pharmacy: [id number], [place] - [address] AT [facility] OF [address] [place] [address] [place] [address] [place] [address] Phone: [phone] Fax: [phone]  Has the prescription been filled recently? Yes  Is the patient out of the medication? Yes  Has the patient been seen for an appointment in the last year OR does the patient have an upcoming appointment? Yes  Can we respond through MyChart? No  Agent: Please be advised that Rx refills may take up to 3 business days. We ask that you follow-up with your pharmacy.

## 2023-02-28 NOTE — Telephone Encounter (Signed)
Requested Prescriptions  Pending Prescriptions Disp Refills   metFORMIN (GLUCOPHAGE) 500 MG tablet 90 tablet 0    Sig: Take 1 tablet (500 mg total) by mouth daily.     Endocrinology:  Diabetes - Biguanides Failed - 02/28/2023  4:02 PM      Failed - HBA1C is between 0 and 7.9 and within 180 days    Hgb A1c MFr Bld  Date Value Ref Range Status  08/29/2022 6.3 (H) 4.8 - 5.6 % Final    Comment:             Prediabetes: 5.7 - 6.4          Diabetes: >6.4          Glycemic control for adults with diabetes: <7.0          Failed - B12 Level in normal range and within 720 days    No results found for: "VITAMINB12"       Passed - Cr in normal range and within 360 days    Creatinine, Ser  Date Value Ref Range Status  12/20/2022 1.10 0.76 - 1.27 mg/dL Final         Passed - eGFR in normal range and within 360 days    GFR calc Af Amer  Date Value Ref Range Status  04/03/2019 114 >59 mL/min/1.73 Final   GFR, Estimated  Date Value Ref Range Status  11/04/2022 >60 >60 mL/min Final    Comment:    (NOTE) Calculated using the CKD-EPI Creatinine Equation (2021)    eGFR  Date Value Ref Range Status  12/20/2022 88 >59 mL/min/1.73 Final         Passed - Valid encounter within last 6 months    Recent Outpatient Visits           2 months ago Essential hypertension   [place] [facility]   4 months ago Bilateral plantar fasciitis   [place] [facility]   6 months ago Type 2 diabetes mellitus with other specified complication, without long-term current use of insulin (HCC)   [place] [facility]   9 months ago Acute maxillary sinusitis, recurrence not specified   [place] [facility]   1 year ago Essential hypertension   [place] [facility]       Future Appointments             In 2 months Duanne Limerick, MD [facility]            Passed - CBC within normal limits and completed in the last 12 months    WBC  Date Value Ref Range Status  11/04/2022 8.4 4.0 - 10.5 K/uL Final   RBC  Date Value Ref Range Status  11/04/2022 5.22 4.22 - 5.81 MIL/uL Final   Hemoglobin  Date Value Ref Range Status  11/04/2022 14.2 13.0 - 17.0 g/dL Final   HCT  Date Value Ref Range Status  11/04/2022 42.2 39.0 - 52.0 % Final   MCHC  Date Value Ref Range Status  11/04/2022 33.6 30.0 - 36.0 g/dL Final   [facility]  Date Value Ref Range Status  11/04/2022 27.2 26.0 - 34.0 pg Final   MCV  Date Value Ref Range Status  11/04/2022 80.8 80.0 - 100.0 fL Final   No results found for: "PLTCOUNTKUC", "LABPLAT", "POCPLA" RDW  Date Value Ref Range Status  11/04/2022 14.5 11.5 - 15.5 % Final

## 2023-03-08 DIAGNOSIS — G4733 Obstructive sleep apnea (adult) (pediatric): Secondary | ICD-10-CM | POA: Diagnosis not present

## 2023-03-16 ENCOUNTER — Other Ambulatory Visit: Payer: Self-pay | Admitting: Family Medicine

## 2023-03-16 ENCOUNTER — Other Ambulatory Visit: Payer: Self-pay | Admitting: Dermatology

## 2023-03-16 DIAGNOSIS — R21 Rash and other nonspecific skin eruption: Secondary | ICD-10-CM

## 2023-03-16 DIAGNOSIS — K219 Gastro-esophageal reflux disease without esophagitis: Secondary | ICD-10-CM

## 2023-04-08 DIAGNOSIS — G4733 Obstructive sleep apnea (adult) (pediatric): Secondary | ICD-10-CM | POA: Diagnosis not present

## 2023-05-06 DIAGNOSIS — G4733 Obstructive sleep apnea (adult) (pediatric): Secondary | ICD-10-CM | POA: Diagnosis not present

## 2023-05-25 ENCOUNTER — Ambulatory Visit: Payer: Self-pay | Admitting: Family Medicine

## 2023-05-27 ENCOUNTER — Other Ambulatory Visit: Payer: Self-pay | Admitting: Family Medicine

## 2023-05-27 DIAGNOSIS — E1169 Type 2 diabetes mellitus with other specified complication: Secondary | ICD-10-CM

## 2023-05-29 ENCOUNTER — Telehealth: Payer: Self-pay | Admitting: Family Medicine

## 2023-05-29 ENCOUNTER — Encounter: Payer: Self-pay | Admitting: Family Medicine

## 2023-05-29 ENCOUNTER — Ambulatory Visit (INDEPENDENT_AMBULATORY_CARE_PROVIDER_SITE_OTHER): Admitting: Family Medicine

## 2023-05-29 DIAGNOSIS — E1169 Type 2 diabetes mellitus with other specified complication: Secondary | ICD-10-CM

## 2023-05-29 DIAGNOSIS — E785 Hyperlipidemia, unspecified: Secondary | ICD-10-CM

## 2023-05-29 DIAGNOSIS — Z7984 Long term (current) use of oral hypoglycemic drugs: Secondary | ICD-10-CM

## 2023-05-29 DIAGNOSIS — K219 Gastro-esophageal reflux disease without esophagitis: Secondary | ICD-10-CM | POA: Diagnosis not present

## 2023-05-29 DIAGNOSIS — I10 Essential (primary) hypertension: Secondary | ICD-10-CM

## 2023-05-29 MED ORDER — TRIAMTERENE-HCTZ 75-50 MG PO TABS
1.0000 | ORAL_TABLET | Freq: Every day | ORAL | 1 refills | Status: DC
Start: 2023-05-29 — End: 2024-01-30

## 2023-05-29 MED ORDER — ATORVASTATIN CALCIUM 10 MG PO TABS
ORAL_TABLET | ORAL | 1 refills | Status: DC
Start: 2023-05-29 — End: 2023-11-27

## 2023-05-29 MED ORDER — PANTOPRAZOLE SODIUM 20 MG PO TBEC
DELAYED_RELEASE_TABLET | ORAL | 0 refills | Status: DC
Start: 2023-05-29 — End: 2023-08-25

## 2023-05-29 MED ORDER — METOPROLOL SUCCINATE ER 25 MG PO TB24: ORAL_TABLET | ORAL | 1 refills | Status: DC

## 2023-05-29 MED ORDER — METFORMIN HCL 500 MG PO TABS
500.0000 mg | ORAL_TABLET | Freq: Every day | ORAL | 1 refills | Status: AC
Start: 2023-05-29 — End: ?

## 2023-05-29 MED ORDER — AMLODIPINE BESYLATE 10 MG PO TABS
ORAL_TABLET | ORAL | 1 refills | Status: AC
Start: 2023-05-29 — End: ?

## 2023-05-29 NOTE — Telephone Encounter (Signed)
 Noted  KP

## 2023-05-29 NOTE — Progress Notes (Signed)
 Date:  05/29/2023   Name:  Luis Mcknight   DOB:  01-06-1981   MRN:  [id number]   Chief Complaint: Diabetes, Anxiety, and Depression  Diabetes He presents for his follow-up diabetic visit. He has type 2 diabetes mellitus. His disease course has been stable. Hypoglycemia symptoms include nervousness/anxiousness. Pertinent negatives for hypoglycemia include no dizziness, headaches, hunger, pallor, sleepiness, speech difficulty or tremors. There are no diabetic associated symptoms. Pertinent negatives for diabetes include no blurred vision, no chest pain, no fatigue, no foot paresthesias, no foot ulcerations, no polydipsia, no polyuria, no visual change, no weakness and no weight loss. There are no hypoglycemic complications. Symptoms are stable. There are no diabetic complications. Risk factors for coronary artery disease include dyslipidemia, diabetes mellitus, male sex and hypertension. Current diabetic treatment includes oral agent (monotherapy). He is following a generally healthy diet. Meal planning includes avoidance of concentrated sweets and carbohydrate counting. He participates in exercise intermittently. There is no change in his home blood glucose trend. An ACE inhibitor/angiotensin II receptor blocker is being taken.  Anxiety Presents for follow-up visit. Symptoms include decreased concentration, excessive worry, irritability, nervous/anxious behavior and restlessness. Patient reports no chest pain, dizziness, palpitations, shortness of breath or suicidal ideas. Symptoms occur most days (off medications/refuses restart). The severity of symptoms is mild.    Depression        This is a chronic problem.  The problem has been gradually worsening (off medications) since onset.  Associated symptoms include decreased concentration, helplessness, hopelessness, restlessness, decreased interest and sad.  Associated symptoms include no fatigue, no headaches and no suicidal ideas.  Past treatments  include nothing (patient refuses).  Compliance with treatment is poor.  Past compliance problems: personal choice.  Previous treatment provided no relief relief.   Lab Results  Component Value Date   NA 141 12/20/2022   K 3.9 12/20/2022   CO2 26 12/20/2022   GLUCOSE 94 12/20/2022   BUN 17 12/20/2022   CREATININE 1.10 12/20/2022   CALCIUM 9.5 12/20/2022   EGFR 88 12/20/2022   GFRNONAA >60 11/04/2022   Lab Results  Component Value Date   CHOL 132 05/30/2022   HDL 33 (L) 05/30/2022   LDLCALC 55 05/30/2022   TRIG 283 (H) 05/30/2022   Lab Results  Component Value Date   TSH 0.649 11/04/2022   Lab Results  Component Value Date   HGBA1C 6.3 (H) 08/29/2022   Lab Results  Component Value Date   WBC 8.4 11/04/2022   HGB 14.2 11/04/2022   HCT 42.2 11/04/2022   MCV 80.8 11/04/2022   PLT 275 11/04/2022   Lab Results  Component Value Date   ALT 30 11/04/2022   AST 31 11/04/2022   ALKPHOS 65 11/04/2022   BILITOT 0.9 11/04/2022   No results found for: "25OHVITD2", "25OHVITD3", "VD25OH"   Review of Systems  Constitutional:  Positive for irritability. Negative for fatigue and weight loss.  Eyes:  Negative for blurred vision and visual disturbance.  Respiratory:  Negative for chest tightness, shortness of breath and wheezing.   Cardiovascular:  Negative for chest pain, palpitations and leg swelling.  Endocrine: Negative for polydipsia and polyuria.  Skin:  Negative for pallor.  Neurological:  Negative for dizziness, tremors, speech difficulty, weakness and headaches.  Hematological:  Negative for adenopathy. Does not bruise/bleed easily.  Psychiatric/Behavioral:  Positive for decreased concentration, depression and dysphoric mood. Negative for agitation and suicidal ideas. The patient is nervous/anxious.     Patient Active Problem List  Diagnosis Date Noted   Post-viral cough syndrome 11/30/2022   URI (upper respiratory infection) 11/30/2022   Nonspecific syndrome  suggestive of viral illness 11/22/2022   Palpitations 05/12/2021   Psychophysiological insomnia 11/26/2020   History of pericarditis 11/06/2020   Metatarsalgia, right foot 08/28/2020   Adjustment reaction with anxiety and depression 08/28/2020   Fibromyalgia 08/28/2020   Plantar fasciitis, right 07/14/2020   Cervical spondylosis with radiculopathy 06/01/2020   Supraspinatus syndrome, 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   Lumbar spondylosis 06/01/2020   Plantar fasciitis, 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   Chest pain of uncertain etiology 09/04/2019   OSA (obstructive sleep apnea) 06/04/2019   Current smoker 06/04/2019   Healthcare maintenance 06/04/2019   Acute pericarditis 04/03/2019   Sarcoidosis 10/16/2018   Lung nodules    Adenopathy    Essential hypertension 11/28/2017    Allergies  Allergen Reactions   Latex Itching and Rash    GLOVES   Pistachio Nut Extract Nausea Only    Past Surgical History:  Procedure Laterality Date   ELECTROMAGNETIC NAVIGATION BROCHOSCOPY Left 10/08/2018   Procedure: ELECTROMAGNETIC NAVIGATION BRONCHOSCOPY, LEFT;  Surgeon: Salena Saner, MD;  Location: [facility] ORS;  Service: Cardiopulmonary;  Laterality: Left;   ENDOBRONCHIAL ULTRASOUND Left 10/08/2018   Procedure: ENDOBRONCHIAL ULTRASOUND, LEFT;  Surgeon: Salena Saner, MD;  Location: [facility] ORS;  Service: Cardiopulmonary;  Laterality: Left;   NO PAST SURGERIES      Social History   Tobacco Use   Smoking status: Every Day    Current packs/day: 0.50    Average packs/day: 0.5 packs/day for 25.2 years (12.6 ttl pk-yrs)    Types: Cigarettes    Start date: 03/07/1998   Smokeless tobacco: Never   Tobacco comments:    0.5PPD 11/04/2021    0.25 PPD 03/04/2022    Smoking 10 per day.  11/30/2022 hfb  Vaping Use   Vaping status: Former   Start date: 04/18/2012   Quit date: 04/18/2016  Substance Use Topics   Alcohol use: Not Currently   Drug use: Not Currently     Medication list has been reviewed and updated.  Current Meds   Medication Sig   Accu-Chek Softclix Lancets lancets USE ONCE DAILY   albuterol (VENTOLIN HFA) 108 (90 Base) MCG/ACT inhaler Inhale 2 puffs into the lungs every 6 (six) hours as needed for wheezing or shortness of breath.   ARIPiprazole (ABILIFY) 2 MG tablet Take 2 mg by mouth daily. psych   blood glucose meter kit and supplies Dispense based on patient and insurance preference. Use up to four times daily as directed. (FOR ICD-10 E10.9, E11.9).   fluticasone (FLONASE) 50 MCG/ACT nasal spray Place 2 sprays into both nostrils daily.   glucose blood (ACCU-CHEK GUIDE) test strip USE ONCE DAILY   Multiple Vitamin (MULTI-VITAMIN) tablet Take 1 tablet by mouth daily.   potassium chloride (KLOR-CON) 10 MEQ tablet Take 1 tablet (10 mEq total) by mouth daily.   tiZANidine (ZANAFLEX) 4 MG tablet SMARTSIG:1.0 Tablet(s) By Mouth Twice Daily   UNABLE TO FIND CPAP AT NIGHT   [DISCONTINUED] amLODipine (NORVASC) 10 MG tablet TAKE 1 TABLET(10 MG) BY MOUTH EVERY MORNING   [DISCONTINUED] atorvastatin (LIPITOR) 10 MG tablet TAKE 1 TABLET(10 MG) BY MOUTH DAILY   [DISCONTINUED] metFORMIN (GLUCOPHAGE) 500 MG tablet TAKE 1 TABLET(500 MG) BY MOUTH DAILY   [DISCONTINUED] metoprolol succinate (TOPROL-XL) 25 MG 24 hr tablet Take 1.5 tablets (37.5 mg) by mouth once daily. Take with or immediately following a meal.   [DISCONTINUED] pantoprazole (PROTONIX) 20 MG tablet TAKE  1 TABLET(20 MG) BY MOUTH DAILY   [DISCONTINUED] triamterene-hydrochlorothiazide (MAXZIDE) 75-50 MG tablet Take 1 tablet by mouth daily.       05/29/2023   10:16 AM 12/13/2022    1:50 PM 08/29/2022   10:08 AM 05/30/2022   10:49 AM  GAD 7 : Generalized Anxiety Score  Nervous, Anxious, on Edge 3 3 0 0  Control/stop worrying 3 3 0 0  Worry too much - different things 3 3 0 0  Trouble relaxing 2 3 0 0  Restless 2 2 0 0  Easily annoyed or irritable 3 2 0 0  Afraid - awful might happen 3 3 0 0  Total GAD 7 Score 19 19 0 0  Anxiety Difficulty Extremely  difficult Somewhat difficult Not difficult at all Not difficult at all       05/29/2023   10:16 AM 12/13/2022    1:50 PM 08/29/2022   10:07 AM  Depression screen PHQ 2/9  Decreased Interest 3 2 0  Down, Depressed, Hopeless 3 2 1   PHQ - 2 Score 6 4 1   Altered sleeping 2 0 0  Tired, decreased energy 2 3 0  Change in appetite 1 0 0  Feeling bad or failure about yourself  3 2 0  Trouble concentrating 3 2 0  Moving slowly or fidgety/restless 1 1 0  Suicidal thoughts 0 0 0  PHQ-9 Score 18 12 1   Difficult doing work/chores Extremely dIfficult Somewhat difficult Not difficult at all    BP Readings from Last 3 Encounters:  05/29/23 110/80  01/31/23 124/66  12/20/22 (!) 120/50    Physical Exam Vitals and nursing note reviewed.  Constitutional:      Appearance: He is well-developed.  HENT:     Head: Normocephalic and atraumatic.     Right Ear: Tympanic membrane, ear canal and external ear normal.     Left Ear: Tympanic membrane, ear canal and external ear normal.     Nose: Nose normal.     Mouth/Throat:     Dentition: Normal dentition.  Eyes:     General: Lids are normal. No scleral icterus.    Conjunctiva/sclera: Conjunctivae normal.     Pupils: Pupils are equal, round, and reactive to light.  Neck:     Thyroid: No thyromegaly.     Vascular: No carotid bruit, hepatojugular reflux or JVD.     Trachea: No tracheal deviation.  Cardiovascular:     Rate and Rhythm: Normal rate and regular rhythm.     Heart sounds: Normal heart sounds. No murmur heard.    No friction rub. No gallop.  Pulmonary:     Effort: Pulmonary effort is normal.     Breath sounds: Normal breath sounds. No wheezing, rhonchi or rales.  Abdominal:     General: Bowel sounds are normal.     Palpations: Abdomen is soft. There is no hepatomegaly, splenomegaly or mass.     Tenderness: There is no abdominal tenderness.     Hernia: There is no hernia in the left inguinal area.  Musculoskeletal:        General:  Normal range of motion.     Cervical back: Normal range of motion and neck supple.  Lymphadenopathy:     Cervical: No cervical adenopathy.  Skin:    General: Skin is warm and dry.     Findings: No rash.  Neurological:     Mental Status: He is alert and oriented to person, place, and time.  Sensory: No sensory deficit.     Deep Tendon Reflexes: Reflexes are normal and symmetric.  Psychiatric:        Mood and Affect: Mood is not anxious or depressed.     Wt Readings from Last 3 Encounters:  05/29/23 232 lb (105.2 kg)  01/31/23 225 lb 9.6 oz (102.3 kg)  12/20/22 228 lb 6.4 oz (103.6 kg)    BP 110/80   Ht 6\' 2"  (1.88 m)   Wt 232 lb (105.2 kg)   BMI 29.79 kg/m   Assessment and Plan: .1. Type 2 diabetes mellitus with other specified complication, without long-term current use of insulin (HCC) Chronic.  Controlled.  Stable.  Patient's actual reason for the visit was diabetes but in the course of doing his PHQ and GAD on assessment it is elevated.  See below.  Patient is tolerating current dosing of metformin 500 mg once a day and we will check A1c for current level of control.  In the meantime I have impressed upon him to limit his concentrated sweets and minimize his carbohydrate intake - metFORMIN (GLUCOPHAGE) 500 MG tablet; Take 1 tablet (500 mg total) by mouth daily at 6 (six) AM. TAKE 1 TABLET(500 MG) BY MOUTH DAILY  Dispense: 90 tablet; Refill: 1 - Hemoglobin A1c  2. Essential hypertension Chronic.  Controlled.  Stable.  Asymptomatic.  Tolerating medications well.  Blood pressure 110/80.  Continue amlodipine 10 mg once a day and triamterene hydrochlorothiazide 75-50 once a day.  Will check renal function panel for electrolytes and GFR. - amLODipine (NORVASC) 10 MG tablet; TAKE 1 TABLET(10 MG) BY MOUTH EVERY MORNING  Dispense: 90 tablet; Refill: 1 - triamterene-hydrochlorothiazide (MAXZIDE) 75-50 MG tablet; Take 1 tablet by mouth daily.  Dispense: 90 tablet; Refill: 1 - Renal  Function Panel  3. Hyperlipidemia, unspecified hyperlipidemia type .  Controlled.  Stable.  Asymptomatic.  Continue atorvastatin 10 mg once a day. - atorvastatin (LIPITOR) 10 MG tablet; TAKE 1 TABLET(10 MG) BY MOUTH DAILY  Dispense: 90 tablet; Refill: 1  4. Gastroesophageal reflux disease without esophagitis Chronic.  Controlled.  Stable.  Asymptomatic.  Patient tolerating pantoprazole 20 mg once a day.  Will continue current dosing. - pantoprazole (PROTONIX) 20 MG tablet; TAKE 1 TABLET(20 MG) BY MOUTH DAILY  Dispense: 90 tablet; Refill: 0   PHQ and GAD were taken and they are elevated to 18 and 19 respectively.  Patient has not taken medication in 4 personal reasons or not elaborate has refused to resume nor will he consider psychiatry referral.  Patient denies any suicidal intent and or self-harm or harm to others in any way.  We had discussions about the need and he prefers not to see anybody at this time although he does relate that he has resigned or either quit going to his job with no intentions to return it would appear.  It was reiterated to the patient if things were to worsen particularly concerned to his own wellbeing and that he is to seek medical help and has invited to return if needed.  Elizabeth Sauer, MD

## 2023-05-29 NOTE — Telephone Encounter (Signed)
 Copied from CRM [id number]. Topic: General - Running Late >> May 29, 2023 10:05 AM Carlatta H wrote: Patient/patient representative is calling because they are running late for an appointment.

## 2023-05-30 ENCOUNTER — Encounter: Payer: Self-pay | Admitting: Family Medicine

## 2023-05-30 LAB — RENAL FUNCTION PANEL
Albumin: 4.7 g/dL (ref 4.1–5.1)
BUN/Creatinine Ratio: 19 (ref 9–20)
BUN: 21 mg/dL (ref 6–24)
CO2: 26 mmol/L (ref 20–29)
Calcium: 9.7 mg/dL (ref 8.7–10.2)
Chloride: 99 mmol/L (ref 96–106)
Creatinine, Ser: 1.1 mg/dL (ref 0.76–1.27)
Glucose: 83 mg/dL (ref 70–99)
Phosphorus: 3.2 mg/dL (ref 2.8–4.1)
Potassium: 4 mmol/L (ref 3.5–5.2)
Sodium: 138 mmol/L (ref 134–144)
eGFR: 87 mL/min/{1.73_m2} (ref >59)

## 2023-05-30 LAB — HEMOGLOBIN A1C
Est. average glucose Bld gHb Est-mCnc: 128 mg/dL
Hgb A1c MFr Bld: 6.1 % — ABNORMAL HIGH (ref 4.8–5.6)

## 2023-06-19 ENCOUNTER — Other Ambulatory Visit: Payer: Self-pay

## 2023-06-19 MED ORDER — ACCU-CHEK SOFTCLIX LANCETS MISC: 100.0000 | Freq: Every day | 1 refills | Status: AC

## 2023-06-28 ENCOUNTER — Other Ambulatory Visit: Payer: Self-pay

## 2023-06-28 ENCOUNTER — Telehealth: Payer: Self-pay | Admitting: Family Medicine

## 2023-06-28 MED ORDER — BLOOD GLUCOSE TEST STRIPS 333 VI STRP: ORAL_STRIP | 2 refills | Status: AC

## 2023-06-28 NOTE — Telephone Encounter (Signed)
 Refill sent ? ?KP ?

## 2023-06-28 NOTE — Telephone Encounter (Signed)
 Copied from CRM [id number]. Topic: Clinical - Medication Refill >> Jun 28, 2023  9:55 AM Phil Braun wrote: Most Recent Primary Care Visit:  Provider: Clarise Crooks  Department: [facility] [place]  Visit Type: OFFICE VISIT  Date: 05/29/2023  Medication: glucose blood (ACCU-CHEK GUIDE) test strip  Has the patient contacted their pharmacy? Yes   Is this the correct pharmacy for this prescription? Yes   This is the patient's preferred pharmacy:  [id number] [place] - [address] AT [facility] [address] [place] [address] [place] [address] [place] [address] Phone: [phone] Fax: [phone]    Has the prescription been filled recently? Yes  Is the patient out of the medication? Yes  Has the patient been seen for an appointment in the last year OR does the patient have an upcoming appointment? Yes  Can we respond through MyChart? Yes  Agent: Please be advised that Rx refills may take up to 3 business days. We ask that you follow-up with your pharmacy.

## 2023-07-04 ENCOUNTER — Telehealth: Payer: Self-pay

## 2023-07-04 ENCOUNTER — Telehealth: Payer: Self-pay | Admitting: Internal Medicine

## 2023-07-04 NOTE — Telephone Encounter (Signed)
 Patient called to follow-up on getting verification from his provider that he is still using his CPAP machine.

## 2023-07-04 NOTE — Telephone Encounter (Signed)
 CRM rcvd: Source  Luis Mcknight (Patient)   Subject  Luis Mcknight (Patient)   Topic  Clinical - Medical Advice    Communication  Reason for CRM: Patient wife called stating [employer] need order for him to continue the CPAP at home please follow up with patient    [phone]     Called patient and advised him to contact Pulmonary(Katherine Cobb) who ordered and handles his CPAP orders as noted 01/2023 visit that he was compliant. Patient understood.

## 2023-07-14 NOTE — Telephone Encounter (Signed)
 Patient unsure of who specifically needs CPAP Compliance report. Sleep coordinator reached out to patient's DME, [employer]. Notified patient to call insurance company to inquire about their needs and sleep coordinator will call patient back with information from [employer].

## 2023-07-25 DIAGNOSIS — G4733 Obstructive sleep apnea (adult) (pediatric): Secondary | ICD-10-CM | POA: Diagnosis not present

## 2023-07-29 NOTE — Progress Notes (Deleted)
 Cardiology Clinic Note   Date: 07/29/2023 ID: Luis Mcknight, DOB 02/27/1981, MRN [id number]  Primary Cardiologist:  Sammy Crisp, MD  Chief Complaint   Luis Mcknight is a 43 y.o. male who presents to the clinic today for ***  Patient Profile   Luis Mcknight is followed by *** for the history outlined below.      Past medical history significant for: Pericarditis. Echo 12/11/2018: EF 60 to 65%.  No LVH.  Normal RV size/function.  Trace MR.  Trivial TR.  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  Moderately dilated pulmonary artery. Palpitations. 7-day ZIO 01/09/2023: Predominantly sinus rhythm.  HR 45 to 144 bpm, average 78 bpm.  Rare PACs.  Occasional PVCs (2.2% burden).  No sustained arrhythmia or prolonged pauses.  Patient triggered events corresponded to sinus rhythm and PVCs. Hypertension. Pulmonary sarcoidosis. Cardiac MRI 04/29/2019: Normal LV size/function, EF 70%.  Normal postgadolinium images with no evidence of scar, infiltration or sarcoid.  Normal RV size/function.  Trivial MR/AR.  No pericardial effusion. OSA. CPAP GERD. Prediabetes. Tobacco abuse.  In summary, patient was diagnosed with pericarditis in January 2020 requiring a prolonged course of colchicine .  He is followed closely by pulmonology for pulmonary sarcoidosis.  Echo October 2020 as above.  He underwent cardiac MRI in February 2021 to rule out cardiac sarcoidosis (detailed above).  Patient was seen in the office by Odessa Bene, NP on 12/20/2022 with complaints of palpitations. He had been seen by urgent care and found to be hypokalemic. ZIO monitor was placed which showed rare PACs and occasional PVCs (2.2% burden). Triggered events were associated with sinus rhythm and PVCs. He was started on Toprol    Patient was last seen in the office by me on 01/31/2019 for for follow-up after cardiac monitor.  He reported less palpitations with current dose of Toprol .  Toprol  was increased.     History of Present  Illness    Today, patient ***  Palpitations 7-day ZIO November 2024 showed NSR with rare PACs, occasional PVCs (2.2% burden).  Patient*** -Educated on triggers.   -Increase Toprol  to 1.5 tablet daily (37.5 mg).***   History of pulmonary sarcoidosis Patient reports stable occasional left upper abdomen/lower lateral chest discomfort and dyspnea related to sarcoidosis that is stable.*** -Continue to follow with pulmonology.    Hypertension BP today***.  -Continue amlodipine , Toprol , Maxzide.   Tobacco abuse Patient smoke <1/2 pack of cigarettes per day. He is interested in quitting.*** -Discussed slowly cutting back to complete cessation.   ROS: All other systems reviewed and are otherwise negative except as noted in History of Present Illness.  EKGs/Labs Reviewed        11/04/2022: ALT 30; AST 31 05/29/2023: BUN 21; Creatinine, Ser 1.10; Potassium 4.0; Sodium 138   11/04/2022: Hemoglobin 14.2; WBC 8.4   11/04/2022: TSH 0.649   No results found for requested labs within last 365 days.  ***  Risk Assessment/Calculations    {Does this patient have ATRIAL FIBRILLATION?:[phone]} No BP recorded.  {Refresh Note OR Click here to enter BP  :1}***        Physical Exam    VS:  There were no vitals taken for this visit. , BMI There is no height or weight on file to calculate BMI.  GEN: Well nourished, well developed, in no acute distress. Neck: No JVD or carotid bruits. Cardiac: *** RRR. No murmurs. No rubs or gallops.   Respiratory:  Respirations regular and unlabored. Clear to auscultation without rales, wheezing or rhonchi.  GI: Soft, nontender, nondistended. Extremities: Radials/DP/PT 2+ and equal bilaterally. No clubbing or cyanosis. No edema ***  Skin: Warm and dry, no rash. Neuro: Strength intact.  Assessment & Plan   ***  Disposition: ***     {Are you ordering a CV Procedure (e.g. stress test, cath, DCCV, TEE, etc)?   Press F2        :[id number]}    Signed, Lonell Rives. Sheylin Scharnhorst, DNP, NP-C

## 2023-08-01 ENCOUNTER — Ambulatory Visit: Admitting: Student

## 2023-08-25 ENCOUNTER — Other Ambulatory Visit: Payer: Self-pay

## 2023-08-25 DIAGNOSIS — K219 Gastro-esophageal reflux disease without esophagitis: Secondary | ICD-10-CM

## 2023-08-25 MED ORDER — PANTOPRAZOLE SODIUM 20 MG PO TBEC
DELAYED_RELEASE_TABLET | ORAL | 0 refills | Status: DC
Start: 2023-08-25 — End: 2023-11-27

## 2023-09-04 ENCOUNTER — Other Ambulatory Visit: Payer: Self-pay | Admitting: *Deleted

## 2023-09-04 ENCOUNTER — Other Ambulatory Visit: Payer: Self-pay | Admitting: Internal Medicine

## 2023-09-04 MED ORDER — METOPROLOL SUCCINATE ER 25 MG PO TB24: ORAL_TABLET | ORAL | 0 refills | Status: DC

## 2023-09-28 ENCOUNTER — Ambulatory Visit: Admitting: Student

## 2023-10-13 ENCOUNTER — Encounter: Admitting: Student

## 2023-11-21 ENCOUNTER — Other Ambulatory Visit: Payer: Self-pay | Admitting: Student

## 2023-11-21 DIAGNOSIS — K219 Gastro-esophageal reflux disease without esophagitis: Secondary | ICD-10-CM

## 2023-11-22 ENCOUNTER — Encounter: Payer: Self-pay | Admitting: *Deleted

## 2023-11-22 NOTE — Telephone Encounter (Signed)
 Requested medication (s) are due for refill today:   Yes  Requested medication (s) are on the active medication list:   Yes  Future visit scheduled:   No  Pt cancelled appts he had in July and Aug. With Dr. Lemon.   LOV 05/29/2023 with Dr. Cathryne Molt.     Last ordered: 08/25/2023 #90, 0 refills  Did not refill because he has not been seen by Dr. Lemon and cancelled his last 2 appts with her.     Requested Prescriptions  Pending Prescriptions Disp Refills   pantoprazole  (PROTONIX ) 20 MG tablet [Pharmacy Med Name: PANTOPRAZOLE  20MG  TABLETS] 90 tablet 0    Sig: TAKE 1 TABLET(20 MG) BY MOUTH DAILY     Gastroenterology: Proton Pump Inhibitors Passed - 11/22/2023  2:08 PM      Passed - Valid encounter within last 12 months    Recent Outpatient Visits           5 months ago Type 2 diabetes mellitus with other specified complication, without long-term current use of insulin (HCC)   [place] [facility], Deanna C, MD

## 2023-11-27 ENCOUNTER — Other Ambulatory Visit: Payer: Self-pay | Admitting: Family Medicine

## 2023-11-27 DIAGNOSIS — E785 Hyperlipidemia, unspecified: Secondary | ICD-10-CM

## 2023-11-27 DIAGNOSIS — K219 Gastro-esophageal reflux disease without esophagitis: Secondary | ICD-10-CM

## 2023-11-27 NOTE — Telephone Encounter (Unsigned)
 Copied from CRM [id number]. Topic: Clinical - Medication Refill >> Nov 27, 2023 11:11 AM Tiffany B wrote: Medication: pantoprazole  (PROTONIX ) 20 MG tablet (2 pills left) , atorvastatin  (LIPITOR) 10 MG tablet (out of of medication)  Has the patient contacted their pharmacy? Yes  (Agent: If yes, when and what did the pharmacy advise?) Contact PCP office  This is the patient's preferred pharmacy:  [facility] #[id number] [facility], [place] - [address] AT [facility] [address] [place] [address] Phone: [phone] Fax: [phone]   Is this the correct pharmacy for this prescription?    Has the prescription been filled recently? Yes  Is the patient out of the medication? No / Yes  Has the patient been seen for an appointment in the last year OR does the patient have an upcoming appointment? Yes  Can we respond through MyChart? Yes  Agent: Please be advised that Rx refills may take up to 3 business days. We ask that you follow-up with your pharmacy.

## 2023-11-28 NOTE — Telephone Encounter (Signed)
 Requested medications are due for refill today.  yes  Requested medications are on the active medications list.  yes  Last refill. Pantoprazole  08/25/2023 #90 0 rf, Atorvastatin  05/29/2023 #90 1 rf  Future visit scheduled.   yes  Notes to clinic.  Labs are expired . Dr. Joshua listed as PCP.    Requested Prescriptions  Pending Prescriptions Disp Refills   pantoprazole  (PROTONIX ) 20 MG tablet 90 tablet 0    Sig: TAKE 1 TABLET(20 MG) BY MOUTH DAILY     Gastroenterology: Proton Pump Inhibitors Passed - 11/28/2023 12:19 PM      Passed - Valid encounter within last 12 months    Recent Outpatient Visits           6 months ago Type 2 diabetes mellitus with other specified complication, without long-term current use of insulin (HCC)   [place] [facility], [place]               atorvastatin  (LIPITOR) 10 MG tablet 90 tablet 1    Sig: TAKE 1 TABLET(10 MG) BY MOUTH DAILY     Cardiovascular:  Antilipid - Statins Failed - 11/28/2023 12:19 PM      Failed - Lipid Panel in normal range within the last 12 months    Cholesterol, Total  Date Value Ref Range Status  05/30/2022 132 100 - 199 mg/dL Final   LDL Chol Calc (NIH)  Date Value Ref Range Status  05/30/2022 55 0 - 99 mg/dL Final   HDL  Date Value Ref Range Status  05/30/2022 33 (L) >39 mg/dL Final   Triglycerides  Date Value Ref Range Status  05/30/2022 283 (H) 0 - 149 mg/dL Final         Passed - Patient is not pregnant      Passed - Valid encounter within last 12 months    Recent Outpatient Visits           6 months ago Type 2 diabetes mellitus with other specified complication, without long-term current use of insulin (HCC)    [facility] at [facility] Joshua Cathryne JAYSON, MD

## 2023-11-28 NOTE — Telephone Encounter (Signed)
 Please review medication refill request

## 2023-11-29 MED ORDER — ATORVASTATIN CALCIUM 10 MG PO TABS
ORAL_TABLET | ORAL | 1 refills | Status: AC
Start: 2023-11-29 — End: ?

## 2023-11-29 MED ORDER — PANTOPRAZOLE SODIUM 20 MG PO TBEC: DELAYED_RELEASE_TABLET | ORAL | 0 refills | Status: DC

## 2023-12-07 ENCOUNTER — Encounter: Admitting: Family Medicine

## 2023-12-14 ENCOUNTER — Telehealth: Payer: Self-pay

## 2023-12-14 ENCOUNTER — Encounter: Admitting: Family Medicine

## 2023-12-14 NOTE — Telephone Encounter (Signed)
Pt not from this office.

## 2023-12-14 NOTE — Telephone Encounter (Signed)
 Copied from CRM #[id number]. Topic: Appointments - Appointment Cancel/Reschedule >> Dec 14, 2023  8:18 AM Cleave MATSU wrote: Patient/patient representative is calling to cancel or reschedule an appointment. Refer to attachments for appointment information.

## 2023-12-21 ENCOUNTER — Other Ambulatory Visit: Payer: Self-pay | Admitting: Family Medicine

## 2023-12-21 ENCOUNTER — Telehealth: Payer: Self-pay

## 2023-12-21 DIAGNOSIS — I10 Essential (primary) hypertension: Secondary | ICD-10-CM

## 2023-12-21 DIAGNOSIS — E1169 Type 2 diabetes mellitus with other specified complication: Secondary | ICD-10-CM

## 2023-12-21 NOTE — Telephone Encounter (Signed)
   Copied from CRM #[id number]. Topic: Appointments - Transfer of Care >> Dec 21, 2023  9:15 AM Willma R wrote: Pt is requesting to transfer FROM: Dr Cathryne Molt Pt is requesting to transfer TO: Dr Mackey Ny Reason for requested transfer: Provider Left It is the responsibility of the team the patient would like to transfer to (Dr. Mackey Ny) to reach out to the patient if for any reason this transfer is not acceptable.

## 2023-12-21 NOTE — Telephone Encounter (Signed)
 Copied from CRM #[id number]. Topic: Clinical - Medication Refill >> Dec 21, 2023  9:10 AM Willma R wrote: Medication:  metFORMIN  (GLUCOPHAGE ) 500 MG tablet amLODipine  (NORVASC ) 10 MG tablet  Has the patient contacted their pharmacy? Yes, call dr  This is the patient's preferred pharmacy:  [facility] #[id number] [facility], [place] - [address] AT [facility] OF [address] [facility] [place] [address] Phone: [phone] Fax: [phone]  Is this the correct pharmacy for this prescription? Yes If no, delete pharmacy and type the correct one.   Has the prescription been filled recently? No  Is the patient out of the medication? No  Has the patient been seen for an appointment in the last year OR does the patient have an upcoming appointment? Yes  Can we respond through MyChart? Yes  Agent: Please be advised that Rx refills may take up to 3 business days. We ask that you follow-up with your pharmacy.

## 2023-12-22 MED ORDER — METFORMIN HCL 500 MG PO TABS: 500.0000 mg | ORAL_TABLET | Freq: Every day | ORAL | 1 refills | Status: AC

## 2023-12-22 MED ORDER — AMLODIPINE BESYLATE 10 MG PO TABS: ORAL_TABLET | ORAL | 1 refills | Status: AC

## 2023-12-22 NOTE — Telephone Encounter (Signed)
 Please review medication refill request

## 2023-12-22 NOTE — Telephone Encounter (Signed)
 Requested medication (s) are due for refill today: ys  Requested medication (s) are on the active medication list: yes  Last refill:  05/29/23  Future visit scheduled: yes  Notes to clinic:  Unable to refill per protocol, last refill by another provider.      Requested Prescriptions  Pending Prescriptions Disp Refills   metFORMIN  (GLUCOPHAGE ) 500 MG tablet 90 tablet 1    Sig: Take 1 tablet (500 mg total) by mouth daily at 6 (six) AM. TAKE 1 TABLET(500 MG) BY MOUTH DAILY     Endocrinology:  Diabetes - Biguanides Failed - 12/22/2023  2:44 PM      Failed - HBA1C is between 0 and 7.9 and within 180 days    Hgb A1c MFr Bld  Date Value Ref Range Status  05/29/2023 6.1 (H) 4.8 - 5.6 % Final    Comment:             Prediabetes: 5.7 - 6.4          Diabetes: >6.4          Glycemic control for adults with diabetes: <7.0          Failed - B12 Level in normal range and within 720 days    No results found for: VITAMINB12       Failed - Valid encounter within last 6 months    Recent Outpatient Visits           6 months ago Type 2 diabetes mellitus with other specified complication, without long-term current use of insulin (HCC)   [place] [facility] at [facility], MD              Failed - CBC within normal limits and completed in the last 12 months    WBC  Date Value Ref Range Status  11/04/2022 8.4 4.0 - 10.5 K/uL Final   RBC  Date Value Ref Range Status  11/04/2022 5.22 4.22 - 5.81 MIL/uL Final   Hemoglobin  Date Value Ref Range Status  11/04/2022 14.2 13.0 - 17.0 g/dL Final   HCT  Date Value Ref Range Status  11/04/2022 42.2 39.0 - 52.0 % Final   MCHC  Date Value Ref Range Status  11/04/2022 33.6 30.0 - 36.0 g/dL Final   [facility]  Date Value Ref Range Status  11/04/2022 27.2 26.0 - 34.0 pg Final   MCV  Date Value Ref Range Status  11/04/2022 80.8 80.0 - 100.0 fL Final   No results found for: PLTCOUNTKUC,  LABPLAT, POCPLA RDW  Date Value Ref Range Status  11/04/2022 14.5 11.5 - 15.5 % Final         Passed - Cr in normal range and within 360 days    Creatinine, Ser  Date Value Ref Range Status  05/29/2023 1.10 0.76 - 1.27 mg/dL Final         Passed - eGFR in normal range and within 360 days    GFR calc Af Amer  Date Value Ref Range Status  04/03/2019 114 >59 mL/min/1.73 Final   GFR, Estimated  Date Value Ref Range Status  11/04/2022 >60 >60 mL/min Final    Comment:    (NOTE) Calculated using the CKD-EPI Creatinine Equation (2021)    eGFR  Date Value Ref Range Status  05/29/2023 87 >59 mL/min/1.73 Final          amLODipine  (NORVASC ) 10 MG tablet 90 tablet 1    Sig: TAKE 1 TABLET(10  MG) BY MOUTH EVERY MORNING     Cardiovascular: Calcium  Channel Blockers 2 Failed - 12/22/2023  2:44 PM      Failed - Valid encounter within last 6 months    Recent Outpatient Visits           6 months ago Type 2 diabetes mellitus with other specified complication, without long-term current use of insulin (HCC)   [place] [facility], [place]              Passed - Last BP in normal range    BP Readings from Last 1 Encounters:  05/29/23 110/80         Passed - Last Heart Rate in normal range    Pulse Readings from Last 1 Encounters:  01/31/23 81

## 2024-01-01 ENCOUNTER — Encounter: Admitting: Family Medicine

## 2024-01-30 ENCOUNTER — Other Ambulatory Visit: Payer: Self-pay | Admitting: Internal Medicine

## 2024-01-30 ENCOUNTER — Other Ambulatory Visit: Payer: Self-pay | Admitting: Family Medicine

## 2024-01-30 DIAGNOSIS — I10 Essential (primary) hypertension: Secondary | ICD-10-CM

## 2024-01-30 MED ORDER — TRIAMTERENE-HCTZ 75-50 MG PO TABS: 1.0000 | ORAL_TABLET | Freq: Every day | ORAL | 0 refills | Status: DC

## 2024-01-30 NOTE — Telephone Encounter (Signed)
 Copied from CRM [id number]. Topic: Clinical - Medication Refill >> Jan 30, 2024  1:28 PM Kevelyn M wrote: Medication: triamterene -hydrochlorothiazide  (MAXZIDE) 75-50 MG tablet  Has the patient contacted their pharmacy? No (Agent: If no, request that the patient contact the pharmacy for the refill. If patient does not wish to contact the pharmacy document the reason why and proceed with request.) (Agent: If yes, when and what did the pharmacy advise?)  This is the patient's preferred pharmacy:  [facility] STORE #[id number] [facility], [place] - [address] AT [facility] [address] [place] [address] Phone: [phone] Fax: [phone]  Is this the correct pharmacy for this prescription? Yes If no, delete pharmacy and type the correct one.   Has the prescription been filled recently? No  Is the patient out of the medication? Yes  Has the patient been seen for an appointment in the last year OR does the patient have an upcoming appointment? No  Can we respond through MyChart? Yes  Agent: Please be advised that Rx refills may take up to 3 business days. We ask that you follow-up with your pharmacy.

## 2024-01-30 NOTE — Progress Notes (Signed)
 Refill sent for 2 weeks. Pt needs in office TOC visit for future refills.

## 2024-01-30 NOTE — Progress Notes (Deleted)
 Date:  01/31/2024   Name:  Luis Mcknight   DOB:  [birth year]   MRN:  [id number]   Chief Complaint: No chief complaint on file.  HPI  Review of Systems   Lab Results  Component Value Date   NA 138 05/29/2023   K 4.0 05/29/2023   CO2 26 05/29/2023   GLUCOSE 83 05/29/2023   BUN 21 05/29/2023   CREATININE 1.10 05/29/2023   CALCIUM  9.7 05/29/2023   EGFR 87 05/29/2023   GFRNONAA >60 11/04/2022   Lab Results  Component Value Date   CHOL 132 05/30/2022   HDL 33 (L) 05/30/2022   LDLCALC 55 05/30/2022   TRIG 283 (H) 05/30/2022   Lab Results  Component Value Date   TSH 0.649 11/04/2022   Lab Results  Component Value Date   HGBA1C 6.1 (H) 05/29/2023   Lab Results  Component Value Date   WBC 8.4 11/04/2022   HGB 14.2 11/04/2022   HCT 42.2 11/04/2022   MCV 80.8 11/04/2022   PLT 275 11/04/2022   Lab Results  Component Value Date   ALT 30 11/04/2022   AST 31 11/04/2022   ALKPHOS 65 11/04/2022   BILITOT 0.9 11/04/2022   No results found for: MARIEN BOLLS, VD25OH   Patient Active Problem List   Diagnosis Date Noted   Post-viral cough syndrome 11/30/2022   URI (upper respiratory infection) 11/30/2022   Nonspecific syndrome suggestive of viral illness 11/22/2022   Palpitations 05/12/2021   Psychophysiological insomnia 11/26/2020   History of pericarditis 11/06/2020   Metatarsalgia, right foot 08/28/2020   Adjustment reaction with anxiety and depression 08/28/2020   Fibromyalgia 08/28/2020   Plantar fasciitis, right 07/14/2020   Cervical spondylosis with radiculopathy 06/01/2020   Supraspinatus syndrome, 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   Lumbar spondylosis 06/01/2020   Plantar fasciitis, 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   Chest pain of uncertain etiology 09/04/2019   OSA (obstructive sleep apnea) 06/04/2019   Current smoker 06/04/2019   Healthcare maintenance 06/04/2019   Acute pericarditis 04/03/2019   Sarcoidosis 10/16/2018   Lung nodules    Adenopathy    Essential  hypertension 11/28/2017    Allergies  Allergen Reactions   Latex Itching and Rash    GLOVES   Pistachio Nut Extract Nausea Only    Past Surgical History:  Procedure Laterality Date   ELECTROMAGNETIC NAVIGATION BROCHOSCOPY Left 10/08/2018   Procedure: ELECTROMAGNETIC NAVIGATION BRONCHOSCOPY, LEFT;  Surgeon: Tamea Dedra CROME, MD;  Location: [facility] ORS;  Service: Cardiopulmonary;  Laterality: Left;   ENDOBRONCHIAL ULTRASOUND Left 10/08/2018   Procedure: ENDOBRONCHIAL ULTRASOUND, LEFT;  Surgeon: Tamea Dedra CROME, MD;  Location: [facility] ORS;  Service: Cardiopulmonary;  Laterality: Left;   NO PAST SURGERIES      Social History   Tobacco Use   Smoking status: Every Day    Current packs/day: 0.50    Average packs/day: 0.5 packs/day for 25.9 years (12.9 ttl pk-yrs)    Types: Cigarettes    Start date: 03/07/1998   Smokeless tobacco: Never   Tobacco comments:    0.5PPD 11/04/2021    0.25 PPD 03/04/2022    Smoking 10 per day.  11/30/2022 hfb  Vaping Use   Vaping status: Former   Start date: 04/18/2012   Quit date: 04/18/2016  Substance Use Topics   Alcohol use: Not Currently   Drug use: Not Currently     Medication list has been reviewed and updated.  No outpatient medications have been marked as taking for the 01/31/24 encounter (Appointment) with Justus Leita DEL, MD.  05/29/2023   10:16 AM 12/13/2022    1:50 PM 08/29/2022   10:08 AM 05/30/2022   10:49 AM  GAD 7 : Generalized Anxiety Score  Nervous, Anxious, on Edge 3 3 0 0  Control/stop worrying 3 3 0 0  Worry too much - different things 3 3 0 0  Trouble relaxing 2 3 0 0  Restless 2 2 0 0  Easily annoyed or irritable 3 2 0 0  Afraid - awful might happen 3 3 0 0  Total GAD 7 Score 19 19 0 0  Anxiety Difficulty Extremely difficult Somewhat difficult Not difficult at all Not difficult at all       05/29/2023   10:16 AM 12/13/2022    1:50 PM 08/29/2022   10:07 AM  Depression screen PHQ 2/9  Decreased Interest 3 2 0   Down, Depressed, Hopeless 3 2 1   PHQ - 2 Score 6 4 1   Altered sleeping 2 0 0  Tired, decreased energy 2 3 0  Change in appetite 1 0 0  Feeling bad or failure about yourself  3 2 0  Trouble concentrating 3 2 0  Moving slowly or fidgety/restless 1 1 0  Suicidal thoughts 0 0 0  PHQ-9 Score 18  12  1    Difficult doing work/chores Extremely dIfficult Somewhat difficult Not difficult at all     Data saved with a previous flowsheet row definition    BP Readings from Last 3 Encounters:  05/29/23 110/80  01/31/23 124/66  12/20/22 (!) 120/50    Physical Exam  Wt Readings from Last 3 Encounters:  05/29/23 232 lb (105.2 kg)  01/31/23 225 lb 9.6 oz (102.3 kg)  12/20/22 228 lb 6.4 oz (103.6 kg)    There were no vitals taken for this visit.  Assessment and Plan:  Problem List Items Addressed This Visit   None   No follow-ups on file.    Leita HILARIO Adie, MD [facility] [place]

## 2024-01-31 ENCOUNTER — Ambulatory Visit: Admitting: Internal Medicine

## 2024-02-08 ENCOUNTER — Ambulatory Visit: Admitting: Family Medicine

## 2024-02-08 ENCOUNTER — Encounter: Payer: Self-pay | Admitting: Family Medicine

## 2024-02-08 VITALS — BP 110/80 | HR 86 | Ht 74.0 in | Wt 232.0 lb

## 2024-02-08 DIAGNOSIS — E1169 Type 2 diabetes mellitus with other specified complication: Secondary | ICD-10-CM

## 2024-02-08 DIAGNOSIS — E785 Hyperlipidemia, unspecified: Secondary | ICD-10-CM | POA: Diagnosis not present

## 2024-02-08 DIAGNOSIS — D869 Sarcoidosis, unspecified: Secondary | ICD-10-CM

## 2024-02-08 DIAGNOSIS — G930 Cerebral cysts: Secondary | ICD-10-CM | POA: Diagnosis not present

## 2024-02-08 DIAGNOSIS — R2231 Localized swelling, mass and lump, right upper limb: Secondary | ICD-10-CM | POA: Diagnosis not present

## 2024-02-08 DIAGNOSIS — R7303 Prediabetes: Secondary | ICD-10-CM

## 2024-02-08 DIAGNOSIS — R2232 Localized swelling, mass and lump, left upper limb: Secondary | ICD-10-CM

## 2024-02-08 DIAGNOSIS — Z125 Encounter for screening for malignant neoplasm of prostate: Secondary | ICD-10-CM | POA: Diagnosis not present

## 2024-02-08 DIAGNOSIS — K219 Gastro-esophageal reflux disease without esophagitis: Secondary | ICD-10-CM

## 2024-02-08 DIAGNOSIS — Z7984 Long term (current) use of oral hypoglycemic drugs: Secondary | ICD-10-CM

## 2024-02-08 DIAGNOSIS — I10 Essential (primary) hypertension: Secondary | ICD-10-CM

## 2024-02-08 DIAGNOSIS — G4733 Obstructive sleep apnea (adult) (pediatric): Secondary | ICD-10-CM

## 2024-02-08 DIAGNOSIS — G4486 Cervicogenic headache: Secondary | ICD-10-CM | POA: Diagnosis not present

## 2024-02-08 MED ORDER — TRIAMTERENE-HCTZ 75-50 MG PO TABS: 1.0000 | ORAL_TABLET | Freq: Every day | ORAL | 1 refills | Status: AC

## 2024-02-08 NOTE — Progress Notes (Signed)
 New Patient Office Visit  Patient ID: Luis Mcknight, Male   DOB: [birth year] 43 y.o. MRN: [id number]  Chief Complaint  Patient presents with   Hypertension   Diabetes   Subjective:     [name] presents to establish care  HPI  Discussed the use of AI scribe software for clinical note transcription with the patient, who gave verbal consent to proceed.  History of Present Illness Luis Mcknight is a 43 year old male with history of hypertension, cervical spondylosis, sarcoidosis and sleep apnea who presents for establishing care with me as his new provider.   He experiences swelling in his fingers and head upon waking, which subsides as the day progresses. The swelling is described as tightness, particularly in his fingers, and his CPAP mask feels tighter in the morning, indicating head swelling. He has a history of sarcoidosis, diagnosed in 2020 or 2021, and suspects the swelling may be related to this condition.  He has been experiencing headaches for over two years, often occurring in the morning and located on the left side of his head. These headaches were present before he started using a CPAP machine for sleep apnea, which he has been using for about two years. The headaches have remained stable over time and are sometimes accompanied by neck pain on the left side. A small cyst in the left temporal region was identified by a neurologist over a year ago. Tylenol  provides some relief for the headaches.  He has a history of high blood pressure, managed with amlodipine  10 mg and Toprolol. He also has diabetes, diagnosed about a year ago, managed with metformin . His medication regimen includes Lipitor for cholesterol, Protonix , potassium pills, and Maxidex . He has a history of depression and anxiety but is not currently taking medication for these conditions due to previous adverse effects.  He has a history of fibromyalgia, plantar fasciitis, and neck and back issues, including a  pinched nerve for which he previously underwent physical therapy without significant improvement. He also reports a history of acute pericarditis and sarcoidosis, as previously diagnosed.  He smokes and has a history of working in various jobs, including Comptroller work. He is currently looking for part-time work and has recently published a book. He is married with four children.   Outpatient Encounter Medications as of 02/08/2024  Medication Sig   Accu-Chek Softclix Lancets lancets 100 each by Other route daily.   albuterol  (VENTOLIN  HFA) 108 (90 Base) MCG/ACT inhaler Inhale 2 puffs into the lungs every 6 (six) hours as needed for wheezing or shortness of breath.   amLODipine  (NORVASC ) 10 MG tablet TAKE 1 TABLET(10 MG) BY MOUTH EVERY MORNING   ARIPiprazole (ABILIFY) 2 MG tablet Take 2 mg by mouth daily. psych   atorvastatin  (LIPITOR) 10 MG tablet TAKE 1 TABLET(10 MG) BY MOUTH DAILY   blood glucose meter kit and supplies Dispense based on patient and insurance preference. Use up to four times daily as directed. (FOR ICD-10 E10.9, E11.9). (Patient taking differently: as needed. Dispense based on patient and insurance preference. Use up to four times daily as directed. (FOR ICD-10 E10.9, E11.9).)   fluticasone  (FLONASE ) 50 MCG/ACT nasal spray Place 2 sprays into both nostrils daily.   glucose blood (ACCU-CHEK GUIDE) test strip USE ONCE DAILY   Glucose Blood (BLOOD GLUCOSE TEST STRIPS 333) STRP Use once daily   metFORMIN  (GLUCOPHAGE ) 500 MG tablet Take 1 tablet (500 mg total) by mouth daily at 6 (six) AM. TAKE 1 TABLET(500  MG) BY MOUTH DAILY   metoprolol  succinate (TOPROL -XL) 25 MG 24 hr tablet TAKE 1 AND 1/2 TABLETS(37.5 MG) BY MOUTH DAILY WITH OR IMMEDIATELY FOLLOWING A MEAL   triamterene -hydrochlorothiazide  (MAXZIDE) 75-50 MG tablet Take 1 tablet by mouth daily for 14 days.   UNABLE TO FIND CPAP AT NIGHT   Multiple Vitamin (MULTI-VITAMIN) tablet Take 1 tablet by mouth daily.  (Patient not taking: Reported on 02/08/2024)   pantoprazole  (PROTONIX ) 20 MG tablet TAKE 1 TABLET(20 MG) BY MOUTH DAILY   potassium chloride  (KLOR-CON ) 10 MEQ tablet Take 1 tablet (10 mEq total) by mouth daily.   tiZANidine (ZANAFLEX) 4 MG tablet SMARTSIG:1.0 Tablet(s) By Mouth Twice Daily (Patient not taking: Reported on 02/08/2024)   No facility-administered encounter medications on file as of 02/08/2024.    Past Medical History:  Diagnosis Date   Anxiety    Arthritis    Depression    NO MEDS   GERD (gastroesophageal reflux disease)    Hypertension    OSA on CPAP    Pericarditis    a. 12/2018 Echo: EF 60-65%, no rwma, nl RV size/fx, triv TR/MR, Ao root 38mm. Mod dil PA.   Pneumonia    JUNE 2020   Pre-diabetes    PVC's (premature ventricular contractions)    a. 12/2022 [facility]: Predominantly sinus rhythm at 78.  2.2% PVC burden.  Rare PACs.  Triggered events associated with sinus rhythm and PVCs.  Toprol -XL started.   Sarcoidosis    Sleep apnea     Past Surgical History:  Procedure Laterality Date   ELECTROMAGNETIC NAVIGATION BROCHOSCOPY Left 10/08/2018   Procedure: ELECTROMAGNETIC NAVIGATION BRONCHOSCOPY, LEFT;  Surgeon: Tamea Dedra CROME, MD;  Location: [facility] ORS;  Service: Cardiopulmonary;  Laterality: Left;   ENDOBRONCHIAL ULTRASOUND Left 10/08/2018   Procedure: ENDOBRONCHIAL ULTRASOUND, LEFT;  Surgeon: Tamea Dedra CROME, MD;  Location: [facility] ORS;  Service: Cardiopulmonary;  Laterality: Left;   NO PAST SURGERIES      Family History  Problem Relation Age of Onset   Hypertension Mother    Diabetes Mother    Diabetes Father    Hypertension Father    Heart attack Father 31   Lung cancer Maternal Grandmother     Social History   Socioeconomic History   Marital status: Married    Spouse name: Gergory Biello   Number of children: 4   Years of education: 12+   Highest education level: Some college, no degree  Occupational History   Occupation: Unemployed   Occupation:  Electronics Engineer    Comment: Photography  Tobacco Use   Smoking status: Every Day    Current packs/day: 0.50    Average packs/day: 0.5 packs/day for 25.9 years (13.0 ttl pk-yrs)    Types: Cigarettes    Start date: 03/07/1998   Smokeless tobacco: Never   Tobacco comments:    0.5PPD 11/04/2021    0.25 PPD 03/04/2022    Smoking 10 per day.  11/30/2022 hfb  Vaping Use   Vaping status: Former   Start date: 04/18/2012   Quit date: 04/18/2016  Substance and Sexual Activity   Alcohol use: Not Currently   Drug use: Not Currently   Sexual activity: Yes    Partners: Female  Other Topics Concern   Not on file  Social History Narrative   Not on file   Social Drivers of Health   Financial Resource Strain: High Risk (12/31/2023)   Overall Financial Resource Strain (CARDIA)    Difficulty of Paying Living Expenses: Hard  Food  Insecurity: Food Insecurity Present (12/31/2023)   Hunger Vital Sign    Worried About Running Out of Food in the Last Year: Sometimes true    Ran Out of Food in the Last Year: Often true  Transportation Needs: Unmet Transportation Needs (12/31/2023)   PRAPARE - Administrator, Civil Service (Medical): Yes    Lack of Transportation (Non-Medical): Yes  Physical Activity: Insufficiently Active (12/31/2023)   Exercise Vital Sign    Days of Exercise per Week: 3 days    Minutes of Exercise per Session: 30 min  Stress: Stress Concern Present (12/31/2023)   [employer] of Occupational Health - Occupational Stress Questionnaire    Feeling of Stress: Rather much  Social Connections: Socially Isolated (12/31/2023)   Social Connection and Isolation Panel    Frequency of Communication with Friends and Family: Once a week    Frequency of Social Gatherings with Friends and Family: Never    Attends Religious Services: Never    Database Administrator or Organizations: No    Attends Engineer, Structural: Not on file    Marital Status: Married   Catering Manager Violence: Not on file    Review of Systems  All other systems reviewed and are negative.     Objective:    BP 110/80   Pulse 86   Ht 6' 2 (1.88 m)   SpO2 96%   BMI 29.79 kg/m   Physical Exam Vitals and nursing note reviewed.  Constitutional:      Appearance: Normal appearance.  HENT:     Head: Normocephalic.     Right Ear: External ear normal.     Left Ear: External ear normal.  Eyes:     Conjunctiva/sclera: Conjunctivae normal.  Cardiovascular:     Rate and Rhythm: Normal rate.  Pulmonary:     Effort: Pulmonary effort is normal. No respiratory distress.  Abdominal:     Palpations: Abdomen is soft.  Musculoskeletal:        General: Normal range of motion.  Skin:    General: Skin is warm.  Neurological:     Mental Status: He is alert and oriented to person, place, and time.  Psychiatric:        Mood and Affect: Mood normal.    Physical Exam           Assessment & Plan:   Problem List Items Addressed This Visit   None   Assessment and Plan Assessment & Plan Essential hypertension Hypertension managed with amlodipine  10 mg. - Continue amlodipine  10 mg daily.  Type 2 diabetes mellitus Managed by Dr. Joshua. No recent lab work due. - Continue metformin  as prescribed.  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 - Referred to rheumatology for evaluation of sarcoidosis-related symptoms.  Obstructive sleep apnea Managed with CPAP. Compliance issues with CPAP equipment provider. Headaches possibly related to CPAP use. - Contact [employer] for CPAP compliance documentation. - Ensure CPAP machine is functioning properly with humidifier.  Cerebral cyst, left temporal lobe with chronic headaches Chronic headaches possibly related to a small cyst in the left temporal lobe. Managed with Tylenol . - Ordered MRI of the brain. -  Referred to neurology for further evaluation.  Neck pain with possible cervical radiculopathy Chronic neck pain with possible cervical radiculopathy. Previous physical therapy was ineffective. - Referred to neurology for further evaluation.  Swelling of fingers and head Swelling possibly related to sarcoidosis. No pain, but  tightness noted. - Referred to rheumatology for evaluation of swelling.  Depression Previous trials of multiple medications. Currently not on any antidepressants due to adverse effects.  Hyperlipidemia Managed with Lipitor. Cholesterol levels controlled. - Continue Lipitor as prescribed.    No follow-ups on file.   Vinary K Kosisochukwu Burningham, MD [facility] at [facility]

## 2024-02-09 ENCOUNTER — Ambulatory Visit: Payer: Self-pay | Admitting: Family Medicine

## 2024-02-09 LAB — COMPREHENSIVE METABOLIC PANEL WITH GFR
ALT: 37 IU/L (ref 0–44)
AST: 22 IU/L (ref 0–40)
Albumin: 4.6 g/dL (ref 4.1–5.1)
Alkaline Phosphatase: 85 IU/L (ref 47–123)
BUN/Creatinine Ratio: 12 (ref 9–20)
BUN: 13 mg/dL (ref 6–24)
Bilirubin Total: 0.4 mg/dL (ref 0.0–1.2)
CO2: 23 mmol/L (ref 20–29)
Calcium: 9.3 mg/dL (ref 8.7–10.2)
Chloride: 100 mmol/L (ref 96–106)
Creatinine, Ser: 1.08 mg/dL (ref 0.76–1.27)
Globulin, Total: 2.4 g/dL (ref 1.5–4.5)
Glucose: 102 mg/dL — ABNORMAL HIGH (ref 70–99)
Potassium: 4 mmol/L (ref 3.5–5.2)
Sodium: 141 mmol/L (ref 134–144)
Total Protein: 7 g/dL (ref 6.0–8.5)
eGFR: 89 mL/min/1.73 (ref >59)

## 2024-02-09 LAB — CBC WITH DIFFERENTIAL/PLATELET
Basophils Absolute: 0 x10E3/uL (ref 0.0–0.2)
Basos: 0 %
EOS (ABSOLUTE): 0.1 x10E3/uL (ref 0.0–0.4)
Eos: 2 %
Hematocrit: 47.6 % (ref 37.5–51.0)
Hemoglobin: 15.4 g/dL (ref 13.0–17.7)
Immature Grans (Abs): 0 x10E3/uL (ref 0.0–0.1)
Immature Granulocytes: 0 %
Lymphocytes Absolute: 2.4 x10E3/uL (ref 0.7–3.1)
Lymphs: 33 %
MCH: 27.2 pg (ref 26.6–33.0)
MCHC: 32.4 g/dL (ref 31.5–35.7)
MCV: 84 fL (ref 79–97)
Monocytes Absolute: 0.5 x10E3/uL (ref 0.1–0.9)
Monocytes: 6 %
Neutrophils Absolute: 4.4 x10E3/uL (ref 1.4–7.0)
Neutrophils: 59 %
Platelets: 249 x10E3/uL (ref 150–450)
RBC: 5.67 x10E6/uL (ref 4.14–5.80)
RDW: 14 % (ref 11.6–15.4)
WBC: 7.4 x10E3/uL (ref 3.4–10.8)

## 2024-02-09 LAB — LIPID PANEL
Chol/HDL Ratio: 3.4 ratio (ref 0.0–5.0)
Cholesterol, Total: 117 mg/dL (ref 100–199)
HDL: 34 mg/dL — ABNORMAL LOW (ref >39)
LDL Chol Calc (NIH): 59 mg/dL (ref 0–99)
Triglycerides: 133 mg/dL (ref 0–149)
VLDL Cholesterol Cal: 24 mg/dL (ref 5–40)

## 2024-02-09 LAB — PSA: Prostate Specific Ag, Serum: 0.4 ng/mL (ref 0.0–4.0)

## 2024-02-23 NOTE — Telephone Encounter (Signed)
 Received fax from insurance stating that MRI will not be covered unless ordered by neurology. Called patient to inform him of the outcome. Patient verbalized understanding and has a neurology appointment schedule for 03/28/2024.

## 2024-02-26 ENCOUNTER — Other Ambulatory Visit: Payer: Self-pay | Admitting: Family Medicine

## 2024-02-26 DIAGNOSIS — K219 Gastro-esophageal reflux disease without esophagitis: Secondary | ICD-10-CM

## 2024-02-28 NOTE — Telephone Encounter (Signed)
 Copied from CRM #[id number]. Topic: Clinical - Prescription Issue >> Feb 28, 2024  9:48 AM Winona R wrote: Pt states he received refills for all of his medication accept pantoprazole  (PROTONIX ) 20 MG tablet Pharmacy has requested it as well

## 2024-02-28 NOTE — Telephone Encounter (Signed)
 Patient has been made aware prescription was sent. Patient verbalized understanding.

## 2024-04-11 ENCOUNTER — Ambulatory Visit: Admitting: Family Medicine

## 2024-05-03 ENCOUNTER — Ambulatory Visit: Admitting: Family Medicine
